# Patient Record
Sex: Female | Born: 1970 | Hispanic: Yes | Marital: Married | Smoking: Never smoker
Health system: Southern US, Community
[De-identification: ages and names within clinical notes are randomized; demographics above are authoritative.]

## PROBLEM LIST (undated history)

## (undated) DIAGNOSIS — I10 Essential (primary) hypertension: Secondary | ICD-10-CM

## (undated) DIAGNOSIS — J45909 Unspecified asthma, uncomplicated: Secondary | ICD-10-CM

## (undated) HISTORY — DX: Essential (primary) hypertension: I10

## (undated) HISTORY — DX: Unspecified asthma, uncomplicated: J45.909

## (undated) HISTORY — PX: ABDOMINAL HYSTERECTOMY: SHX81

---
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
## 1998-08-15 HISTORY — PX: OTHER SURGICAL HISTORY: SHX169

## 2009-08-15 HISTORY — PX: OTHER SURGICAL HISTORY: SHX169

## 2010-08-15 HISTORY — PX: ROTATOR CUFF REPAIR: SHX139

## 2012-05-31 ENCOUNTER — Emergency Department: Payer: Self-pay | Admitting: Emergency Medicine

## 2012-06-10 ENCOUNTER — Emergency Department: Payer: Self-pay | Admitting: Emergency Medicine

## 2012-08-15 DIAGNOSIS — J45909 Unspecified asthma, uncomplicated: Secondary | ICD-10-CM

## 2012-08-15 HISTORY — DX: Unspecified asthma, uncomplicated: J45.909

## 2013-05-06 ENCOUNTER — Emergency Department: Payer: Self-pay | Admitting: Emergency Medicine

## 2013-09-26 ENCOUNTER — Ambulatory Visit: Payer: Self-pay | Admitting: Obstetrics and Gynecology

## 2013-10-20 ENCOUNTER — Emergency Department: Payer: Self-pay | Admitting: Emergency Medicine

## 2013-10-20 LAB — CBC WITH DIFFERENTIAL/PLATELET
BASOS ABS: 0 10*3/uL (ref 0.0–0.1)
Basophil %: 0.4 %
EOS PCT: 1 %
Eosinophil #: 0.1 10*3/uL (ref 0.0–0.7)
HCT: 41.6 % (ref 35.0–47.0)
HGB: 13.7 g/dL (ref 12.0–16.0)
Lymphocyte #: 2.5 10*3/uL (ref 1.0–3.6)
Lymphocyte %: 34 %
MCH: 28.9 pg (ref 26.0–34.0)
MCHC: 32.9 g/dL (ref 32.0–36.0)
MCV: 88 fL (ref 80–100)
MONO ABS: 0.7 x10 3/mm (ref 0.2–0.9)
Monocyte %: 9.4 %
NEUTROS ABS: 4 10*3/uL (ref 1.4–6.5)
NEUTROS PCT: 55.2 %
PLATELETS: 194 10*3/uL (ref 150–440)
RBC: 4.73 10*6/uL (ref 3.80–5.20)
RDW: 13.2 % (ref 11.5–14.5)
WBC: 7.2 10*3/uL (ref 3.6–11.0)

## 2013-10-20 LAB — URINALYSIS, COMPLETE
BILIRUBIN, UR: NEGATIVE
Bacteria: NONE SEEN
Glucose,UR: NEGATIVE mg/dL (ref 0–75)
Ketone: NEGATIVE
LEUKOCYTE ESTERASE: NEGATIVE
Nitrite: NEGATIVE
Ph: 5 (ref 4.5–8.0)
RBC,UR: 11 /HPF (ref 0–5)
Specific Gravity: 1.025 (ref 1.003–1.030)
Squamous Epithelial: 1
WBC UR: 1 /HPF (ref 0–5)

## 2013-10-20 LAB — LIPASE, BLOOD: LIPASE: 164 U/L (ref 73–393)

## 2013-10-20 LAB — COMPREHENSIVE METABOLIC PANEL
ALBUMIN: 3.8 g/dL (ref 3.4–5.0)
ALT: 33 U/L (ref 12–78)
ANION GAP: 5 — AB (ref 7–16)
Alkaline Phosphatase: 69 U/L
BUN: 13 mg/dL (ref 7–18)
Bilirubin,Total: 0.4 mg/dL (ref 0.2–1.0)
Calcium, Total: 8.6 mg/dL (ref 8.5–10.1)
Chloride: 110 mmol/L — ABNORMAL HIGH (ref 98–107)
Co2: 25 mmol/L (ref 21–32)
Creatinine: 0.73 mg/dL (ref 0.60–1.30)
EGFR (Non-African Amer.): >60
GLUCOSE: 89 mg/dL (ref 65–99)
Osmolality: 279 (ref 275–301)
Potassium: 3.7 mmol/L (ref 3.5–5.1)
SGOT(AST): 36 U/L (ref 15–37)
Sodium: 140 mmol/L (ref 136–145)
TOTAL PROTEIN: 8 g/dL (ref 6.4–8.2)

## 2013-10-24 ENCOUNTER — Emergency Department: Payer: Self-pay | Admitting: Emergency Medicine

## 2013-10-24 LAB — CBC WITH DIFFERENTIAL/PLATELET
BASOS ABS: 0 10*3/uL (ref 0.0–0.1)
Basophil %: 0.3 %
EOS ABS: 0 10*3/uL (ref 0.0–0.7)
Eosinophil %: 0.1 %
HCT: 41.3 % (ref 35.0–47.0)
HGB: 14.3 g/dL (ref 12.0–16.0)
Lymphocyte #: 0.8 10*3/uL — ABNORMAL LOW (ref 1.0–3.6)
Lymphocyte %: 9.5 %
MCH: 30.1 pg (ref 26.0–34.0)
MCHC: 34.6 g/dL (ref 32.0–36.0)
MCV: 87 fL (ref 80–100)
MONOS PCT: 10.7 %
Monocyte #: 0.9 x10 3/mm (ref 0.2–0.9)
NEUTROS ABS: 6.6 10*3/uL — AB (ref 1.4–6.5)
Neutrophil %: 79.4 %
Platelet: 195 10*3/uL (ref 150–440)
RBC: 4.74 10*6/uL (ref 3.80–5.20)
RDW: 13.3 % (ref 11.5–14.5)
WBC: 8.3 10*3/uL (ref 3.6–11.0)

## 2013-10-24 LAB — COMPREHENSIVE METABOLIC PANEL
ALBUMIN: 3.8 g/dL (ref 3.4–5.0)
ANION GAP: 5 — AB (ref 7–16)
Alkaline Phosphatase: 74 U/L
BUN: 6 mg/dL — ABNORMAL LOW (ref 7–18)
Bilirubin,Total: 0.4 mg/dL (ref 0.2–1.0)
CHLORIDE: 108 mmol/L — AB (ref 98–107)
Calcium, Total: 8.4 mg/dL — ABNORMAL LOW (ref 8.5–10.1)
Co2: 25 mmol/L (ref 21–32)
Creatinine: 0.8 mg/dL (ref 0.60–1.30)
EGFR (African American): >60
EGFR (Non-African Amer.): >60
Glucose: 95 mg/dL (ref 65–99)
Osmolality: 273 (ref 275–301)
POTASSIUM: 3.6 mmol/L (ref 3.5–5.1)
SGOT(AST): 35 U/L (ref 15–37)
SGPT (ALT): 32 U/L (ref 12–78)
SODIUM: 138 mmol/L (ref 136–145)
TOTAL PROTEIN: 8.3 g/dL — AB (ref 6.4–8.2)

## 2013-10-24 LAB — URINALYSIS, COMPLETE
Bilirubin,UR: NEGATIVE
Glucose,UR: NEGATIVE mg/dL (ref 0–75)
Ketone: NEGATIVE
Leukocyte Esterase: NEGATIVE
Nitrite: NEGATIVE
PH: 5 (ref 4.5–8.0)
Protein: 100
RBC,UR: 464 /HPF (ref 0–5)
Specific Gravity: 1.008 (ref 1.003–1.030)
Squamous Epithelial: 2

## 2013-10-24 LAB — LIPASE, BLOOD: LIPASE: 106 U/L (ref 73–393)

## 2013-10-24 LAB — PREGNANCY, URINE: Pregnancy Test, Urine: NEGATIVE m[IU]/mL

## 2014-08-12 ENCOUNTER — Ambulatory Visit: Payer: Self-pay | Admitting: Obstetrics and Gynecology

## 2014-08-12 DIAGNOSIS — I10 Essential (primary) hypertension: Secondary | ICD-10-CM

## 2014-08-12 LAB — CBC
HCT: 42 % (ref 35.0–47.0)
HGB: 13.9 g/dL (ref 12.0–16.0)
MCH: 29.7 pg (ref 26.0–34.0)
MCHC: 33.1 g/dL (ref 32.0–36.0)
MCV: 90 fL (ref 80–100)
Platelet: 184 10*3/uL (ref 150–440)
RBC: 4.68 10*6/uL (ref 3.80–5.20)
RDW: 13.3 % (ref 11.5–14.5)
WBC: 7.2 10*3/uL (ref 3.6–11.0)

## 2014-08-12 LAB — BASIC METABOLIC PANEL
Anion Gap: 4 — ABNORMAL LOW (ref 7–16)
BUN: 9 mg/dL (ref 7–18)
CHLORIDE: 104 mmol/L (ref 98–107)
Calcium, Total: 9.6 mg/dL (ref 8.5–10.1)
Co2: 31 mmol/L (ref 21–32)
Creatinine: 0.75 mg/dL (ref 0.60–1.30)
EGFR (African American): >60
GLUCOSE: 94 mg/dL (ref 65–99)
OSMOLALITY: 276 (ref 275–301)
Potassium: 4.4 mmol/L (ref 3.5–5.1)
Sodium: 139 mmol/L (ref 136–145)

## 2014-08-12 LAB — HEPATIC FUNCTION PANEL A (ARMC)
ALBUMIN: 3.6 g/dL (ref 3.4–5.0)
Alkaline Phosphatase: 80 U/L
Bilirubin,Total: 0.4 mg/dL (ref 0.2–1.0)
SGOT(AST): 47 U/L — ABNORMAL HIGH (ref 15–37)
SGPT (ALT): 60 U/L
Total Protein: 7.8 g/dL (ref 6.4–8.2)

## 2014-08-18 ENCOUNTER — Ambulatory Visit: Payer: Self-pay | Admitting: Obstetrics and Gynecology

## 2014-11-28 ENCOUNTER — Other Ambulatory Visit: Payer: Self-pay | Admitting: Oncology

## 2014-11-28 DIAGNOSIS — Z1231 Encounter for screening mammogram for malignant neoplasm of breast: Secondary | ICD-10-CM

## 2014-12-08 LAB — SURGICAL PATHOLOGY

## 2014-12-14 NOTE — Op Note (Signed)
PATIENT NAME:  Anita Mendoza, Anita Mendoza MR#:  [id number] DATE OF BIRTH:  29-Apr-1971  DATE OF PROCEDURE:  08/18/2014  PREOPERATIVE DIAGNOSES:  1. Menorrhagia.  2. 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   POSTOPERATIVE DIAGNOSES: 1. Menorrhagia.  2. 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   OPERATIVE PROCEDURE:  1. Hysteroscopy.  2. Dilation and curettage of the endometrium.   SURGEON: Prentice DockerMartin A. Raejean Swinford, MD.  FIRST ASSISTANT: None.   ANESTHESIA: General.   INDICATIONS: The patient is a 44 year old Latin female, para 0, with history of chronic hypertension, diabetes mellitus, and obesity type 2, who presents for surgical evaluation and management of menorrhagia. Preoperative endometrial biopsy demonstrated complex atypical endometrial hyperplasia in a background of simple hyperplasia with atypia.   FINDINGS AT SURGERY: Uterus was difficult to palpate on bimanual examination. The uterus did sound to 10 cm. There was a shaggy endometrium noted on hysteroscopy without evidence of overt abnormal intrauterine lesions.   DESCRIPTION OF PROCEDURE: The patient was brought to the Operating Room where she was placed in the spines position. General anesthesia was induced without difficulty. She was placed in the dorsal lithotomy position using the bumblebee stirrups. A Betadine perineal, intravaginal prep and drape was performed in standard fashion. A silicone catheter was used to drain 200 mL of urine from the bladder. Bimanual exam demonstrated the uterus to be difficult to palpate. A Sims retractor was placed into the vagina, and a single-tooth tenaculum was placed on the anterior lip of the cervix. The uterus did sound to 10 cm. The cavity appeared to be regular. The [facility] hysteroscope using lactated Ringer's as irrigant was used to assess the intrauterine cavity. Shaggy endometrium was noted without evidence of focal abnormal  lesions. Curettage was performed with both smooth and serrated curettes. Tissue was sent to pathology. Repeat hysteroscopy demonstrated minimal residual tissue left behind. Upon completion of the procedure, all instrumentation was removed from the vagina. The patient was then awakened, mobilized, and taken to the recovery room in satisfactory condition.   ESTIMATED BLOOD LOSS: 25 mL.   IV FLUIDS: Not quantified at the time of this dictation.   URINE OUTPUT: 200 mL.   COUNTS: All instruments, needle, and sponge counts were verified as correct. The patient is not a good candidate for vaginal surgery if hysterectomy is desired due to the patient's body habitus and nulliparous state.    ____________________________ Prentice DockerMartin A. Yannis Broce, MD mad:jh D: 08/18/2014 11:01:01 ET T: 08/18/2014 13:24:00 ET JOB#: [id number]  cc: Daphine DeutscherMartin A. Isiac Breighner, MD, <Dictator> [facility] Prentice DockerMARTIN A Chandani Rogowski MD ELECTRONICALLY SIGNED 08/22/2014 14:47

## 2014-12-22 ENCOUNTER — Ambulatory Visit
Admission: RE | Admit: 2014-12-22 | Discharge: 2014-12-22 | Disposition: A | Discharge status: Home / Self Care | Payer: Self-pay | Source: Ambulatory Visit | Attending: Oncology | Admitting: Oncology

## 2014-12-22 ENCOUNTER — Inpatient Hospital Stay: Payer: Self-pay | Attending: Oncology | Admitting: *Deleted

## 2014-12-22 ENCOUNTER — Encounter: Payer: Self-pay | Admitting: *Deleted

## 2014-12-22 DIAGNOSIS — Z0000 Encounter for general adult medical examination without abnormal findings: Secondary | ICD-10-CM

## 2014-12-22 DIAGNOSIS — Z1231 Encounter for screening mammogram for malignant neoplasm of breast: Secondary | ICD-10-CM

## 2014-12-22 NOTE — Progress Notes (Signed)
Subjective:     Patient ID: Anita Mendoza, female   DOB: 08-Jun-1971, 44 y.o.   MRN: [id number]  HPI   Review of Systems     Objective:   Physical Exam  Pulmonary/Chest: Right breast exhibits no inverted nipple, no mass, no nipple discharge, no skin change and no tenderness. Left breast exhibits no inverted nipple, no mass, no nipple discharge, no skin change and no tenderness. Breasts are symmetrical.         Assessment:     44 year old English speaking Latino female [facility].  Last mammogram 12/15 was a birads 1.  Patient states she had a hysterectomy in February 2016 for some type of "pre-cancerous" finding.  Clinical breast exam unremarkable.    Plan:     Screening mammogram ordered.  Follow-up per [facility] protocol.

## 2014-12-29 ENCOUNTER — Encounter: Payer: Self-pay | Admitting: *Deleted

## 2014-12-29 NOTE — Progress Notes (Signed)
Letter mailed from the [facility] to inform patient of her normal mammogram results.  Patient is to follow-up with annual screening in one year.  HSIS to Christy. 

## 2015-04-06 ENCOUNTER — Emergency Department: Payer: Self-pay

## 2015-04-06 ENCOUNTER — Emergency Department
Admission: EM | Admit: 2015-04-06 | Discharge: 2015-04-06 | Disposition: A | Discharge status: Home / Self Care | Payer: Self-pay | Attending: Emergency Medicine | Admitting: Emergency Medicine

## 2015-04-06 ENCOUNTER — Encounter: Payer: Self-pay | Admitting: Emergency Medicine

## 2015-04-06 DIAGNOSIS — Y998 Other external cause status: Secondary | ICD-10-CM | POA: Insufficient documentation

## 2015-04-06 DIAGNOSIS — I10 Essential (primary) hypertension: Secondary | ICD-10-CM | POA: Insufficient documentation

## 2015-04-06 DIAGNOSIS — S3992XA Unspecified injury of lower back, initial encounter: Secondary | ICD-10-CM | POA: Insufficient documentation

## 2015-04-06 DIAGNOSIS — M25512 Pain in left shoulder: Secondary | ICD-10-CM

## 2015-04-06 DIAGNOSIS — Z7951 Long term (current) use of inhaled steroids: Secondary | ICD-10-CM | POA: Insufficient documentation

## 2015-04-06 DIAGNOSIS — Z91040 Latex allergy status: Secondary | ICD-10-CM | POA: Insufficient documentation

## 2015-04-06 DIAGNOSIS — W010XXA Fall on same level from slipping, tripping and stumbling without subsequent striking against object, initial encounter: Secondary | ICD-10-CM | POA: Insufficient documentation

## 2015-04-06 DIAGNOSIS — S8992XA Unspecified injury of left lower leg, initial encounter: Secondary | ICD-10-CM | POA: Insufficient documentation

## 2015-04-06 DIAGNOSIS — Z79899 Other long term (current) drug therapy: Secondary | ICD-10-CM | POA: Insufficient documentation

## 2015-04-06 DIAGNOSIS — Y9389 Activity, other specified: Secondary | ICD-10-CM | POA: Insufficient documentation

## 2015-04-06 DIAGNOSIS — Y92512 Supermarket, store or market as the place of occurrence of the external cause: Secondary | ICD-10-CM | POA: Insufficient documentation

## 2015-04-06 DIAGNOSIS — S4992XA Unspecified injury of left shoulder and upper arm, initial encounter: Secondary | ICD-10-CM | POA: Insufficient documentation

## 2015-04-06 MED ORDER — HYDROCODONE-ACETAMINOPHEN 5-325 MG PO TABS
1.0000 | ORAL_TABLET | Freq: Once | ORAL | Status: AC
  Administered 2015-04-06: 1 via ORAL
  Filled 2015-04-06: qty 1

## 2015-04-06 MED ORDER — TRAMADOL HCL 50 MG PO TABS: 50.0000 mg | ORAL_TABLET | Freq: Four times a day (QID) | ORAL | Status: DC | PRN

## 2015-04-06 NOTE — Discharge Instructions (Signed)
Shoulder Pain  The shoulder is the joint that connects your arm to your body. Muscles and band-like tissues that connect bones to muscles (tendons) hold the joint together. Shoulder pain is felt if an injury or medical problem affects one or more parts of the shoulder.  HOME CARE   · Put ice on the sore area.  ¨ Put ice in a plastic bag.  ¨ Place a towel between your skin and the bag.  ¨ Leave the ice on for 15-20 minutes, 03-04 times a day for the first 2 days.  · Stop using cold packs if they do not help with the pain.  · If you were given something to keep your shoulder from moving (sling; shoulder immobilizer), wear it as told. Only take it off to shower or bathe.  · Move your arm as little as possible, but keep your hand moving to prevent puffiness (swelling).  · Squeeze a soft ball or foam pad as much as possible to help prevent swelling.  · Take medicine as told by your doctor.  GET HELP IF:  · You have progressing new pain in your arm, hand, or fingers.  · Your hand or fingers get cold.  · Your medicine does not help lessen your pain.  GET HELP RIGHT AWAY IF:   · Your arm, hand, or fingers are numb or tingling.  · Your arm, hand, or fingers are puffy (swollen), painful, or turn white or blue.  MAKE SURE YOU:   · Understand these instructions.  · Will watch your condition.  · Will get help right away if you are not doing well or get worse.  Document Released: 01/18/2008 Document Revised: 12/16/2013 Document Reviewed: 02/13/2012  ExitCare® Patient Information ©2015 [employer]. This information is not intended to replace advice given to you by your health care provider. Make sure you discuss any questions you have with your health care provider.

## 2015-04-06 NOTE — ED Notes (Signed)
Pt presents to ER with pain to left shoulder,left knee, and lower back. Pt states she was in a store, tripped on uneven ground, states her left shoulder is sore, lower back pain and left knee pain from fall. Pt has full ROM just has pain when moving.

## 2015-04-06 NOTE — ED Notes (Signed)
Pt states she was in a store, tripped on uneven ground, states her left shoulder is sore, lower back pain and left knee pain from fall.

## 2015-04-06 NOTE — ED Notes (Signed)
Pt walked to triage with no issues.

## 2015-04-06 NOTE — ED Provider Notes (Signed)
[facility] Emergency Department Provider Note  Time seen: 8:43 PM  I have reviewed the triage vital signs and the nursing notes.   HISTORY  Chief Complaint Fall    HPI Anita Mendoza is a 44 y.o. female with a past medical history of hypertension and asthma who presents the emergency department after a fall at a store. According to the patient she was at a store when she tripped on a step falling forward landing on her arms and knees. Patient states pain to her left shoulder, left knee, and lower back. Denies hitting her head. Denies loss of consciousness. She states she had rotator cuff surgery 3 years ago, and her pain to the area feels like it did when she tore her rotator cuff. Denies any numbness or weakness. Patient has been ambulatory, she states mild knee pain when she walks but is able to walk without a limp. Describes her pain as moderate in the left shoulder, mild in the lower back and left knee. Dull/aching quality. Worse when moving.     Past Medical History  Diagnosis Date  . Hypertension   . Asthma 2014    There are no active problems to display for this patient.   Past Surgical History  Procedure Laterality Date  . Abdominal hysterectomy    . Choleycystectomy N/A 2000  . Tonsillectomy N/A 1979  . Carpul tunnel Bilateral 2011  . Rotator cuff repair Left 2012    Current Outpatient Rx  Name  Route  Sig  Dispense  Refill  . albuterol (PROVENTIL HFA;VENTOLIN HFA) 108 (90 BASE) MCG/ACT inhaler   Inhalation   Inhale 2 puffs into the lungs every 6 (six) hours as needed for wheezing or shortness of breath. Patient takes 2 times a day         . beclomethasone (QVAR) 40 MCG/ACT inhaler   Inhalation   Inhale 2 puffs into the lungs 2 (two) times daily.         Marland Kitchen esomeprazole (NEXIUM) 40 MG capsule   Oral   Take 40 mg by mouth daily at 12 noon.         . fluticasone (FLONASE) 50 MCG/ACT nasal spray   Each Nare   Place 2 sprays into  both nostrils daily.         Marland Kitchen lisinopril (PRINIVIL,ZESTRIL) 5 MG tablet   Oral   Take 5 mg by mouth daily.         Marland Kitchen loratadine (CLARITIN) 10 MG tablet   Oral   Take 10 mg by mouth daily.         . metFORMIN (GLUCOPHAGE) 500 MG tablet   Oral   Take 500 mg by mouth daily with breakfast.         . ranitidine (ZANTAC) 150 MG tablet   Oral   Take 150 mg by mouth 2 (two) times daily.           Allergies Shellfish allergy and Latex  Family History  Problem Relation Age of Onset  . Cancer Father     prostate  . Breast cancer Maternal Aunt   . Cancer Maternal Aunt   . Breast cancer Cousin   . Cancer Cousin     bladder    Social History Social History  Substance Use Topics  . Smoking status: Never Smoker   . Smokeless tobacco: None  . Alcohol Use: No    Review of Systems Constitutional: Negative for fever. Cardiovascular: Negative for chest pain. Respiratory:  Negative for shortness of breath. Gastrointestinal: Negative for abdominal pain Genitourinary: Negative for dysuria. Musculoskeletal: Positive for lower back pain. Positive for left shoulder pain. Positive for left knee pain. Skin: Negative for rash. No bruising. Neurological: Negative for headache 10-point ROS otherwise negative.  ____________________________________________   PHYSICAL EXAM:  VITAL SIGNS: ED Triage Vitals  Enc Vitals Group     BP 04/06/15 1840 117/73 mmHg     Pulse Rate 04/06/15 1839 98     Resp 04/06/15 1839 18     Temp 04/06/15 1839 98.4 F (36.9 C)     Temp Source 04/06/15 1839 Oral     SpO2 04/06/15 1839 100 %     Weight 04/06/15 1837 250 lb (113.399 kg)     Height 04/06/15 1837 5\' 7"  (1.702 m)     Head Cir --      Peak Flow --      Pain Score 04/06/15 1837 10     Pain Loc --      Pain Edu? --      Excl. in GC? --     Constitutional: Alert and oriented. Well appearing and in no distress. Eyes: Normal exam ENT   Head: Normocephalic and  atraumatic. Cardiovascular: Normal rate, regular rhythm.  Respiratory: Normal respiratory effort without tachypnea nor retractions. Breath sounds are clear and equal bilaterally. No wheezes/rales/rhonchi. Gastrointestinal: Soft and nontender. No distention.  Musculoskeletal: Moderate tenderness to the left shoulder, good range of motion but with pain. Neurovascularly intact distally with 2+ radial pulse. Mild left knee tenderness palpation, normal range of motion. No effusion palpated. Mild lower back pain palpated, no deformity, appears to be more paraspinal. Neurologic:  Normal speech and language. No gross focal neurologic deficits Skin:  Skin is warm, dry and intact.  Psychiatric: Mood and affect are normal. Speech and behavior are normal.  ____________________________________________   RADIOLOGY  Left shoulder X-ray negative  ____________________________________________    INITIAL IMPRESSION / ASSESSMENT AND PLAN / ED COURSE  Pertinent labs & imaging results that were available during my care of the patient were reviewed by me and considered in my medical decision making (see chart for details).  Patient with lower back pain, left shoulder pain, left knee pain following a fall at a store. The fall appears to be mechanical. We will x-ray her left shoulder, and treat her pain. The patient will follow up with orthopedics for further evaluation.  Left shoulder x-ray negative, patient feeling better after Norco. We'll discharge the patient home with Ultram and orthopedic follow-up. ____________________________________________   FINAL CLINICAL IMPRESSION(S) / ED DIAGNOSES  Contusions Musculoskeletal pain Thresa Ross, MD 04/06/15 2151

## 2015-12-23 ENCOUNTER — Encounter: Payer: Self-pay | Admitting: *Deleted

## 2015-12-23 ENCOUNTER — Ambulatory Visit
Admission: RE | Admit: 2015-12-23 | Discharge: 2015-12-23 | Disposition: A | Discharge status: Home / Self Care | Payer: Self-pay | Source: Ambulatory Visit | Attending: Oncology | Admitting: Oncology

## 2015-12-23 ENCOUNTER — Ambulatory Visit: Payer: Self-pay | Attending: Oncology | Admitting: *Deleted

## 2015-12-23 VITALS — BP 145/92 | HR 68 | Temp 98.2°F | Ht 66.54 in | Wt 267.4 lb

## 2015-12-23 DIAGNOSIS — Z0000 Encounter for general adult medical examination without abnormal findings: Secondary | ICD-10-CM

## 2015-12-23 NOTE — Patient Instructions (Signed)
Hypertension Hypertension, commonly called high blood pressure, is when the force of blood pumping through your arteries is too strong. Your arteries are the blood vessels that carry blood from your heart throughout your body. A blood pressure reading consists of a higher number over a lower number, such as 110/72. The higher number (systolic) is the pressure inside your arteries when your heart pumps. The lower number (diastolic) is the pressure inside your arteries when your heart relaxes. Ideally you want your blood pressure below 120/80. Hypertension forces your heart to work harder to pump blood. Your arteries may become narrow or stiff. Having untreated or uncontrolled hypertension can cause heart attack, stroke, kidney disease, and other problems. RISK FACTORS Some risk factors for high blood pressure are controllable. Others are not.  Risk factors you cannot control include:   Race. You may be at higher risk if you are African American.  Age. Risk increases with age.  Gender. Men are at higher risk than women before age 44 years. After age 44, women are at higher risk than men. Risk factors you can control include:  Not getting enough exercise or physical activity.  Being overweight.  Getting too much fat, sugar, calories, or salt in your diet.  Drinking too much alcohol.  Gave patient hand-out, [facility] St[facility], [facility] and [facility], with education on breast health, pap smears, heart and colon health. SIGNS AND SYMPTOMS Hypertension does not usually cause signs or symptoms. Extremely high blood pressure (hypertensive crisis) may cause headache, anxiety, shortness of breath, and nosebleed. DIAGNOSIS To check if you have hypertension, your health care provider will measure your blood pressure while you are seated, with your arm held at the level of your heart. It should be measured at least twice using the same arm. Certain conditions can cause a difference in blood  pressure between your right and left arms. A blood pressure reading that is higher than normal on one occasion does not mean that you need treatment. If it is not clear whether you have high blood pressure, you may be asked to return on a different day to have your blood pressure checked again. Or, you may be asked to monitor your blood pressure at home for 1 or more weeks. TREATMENT Treating high blood pressure includes making lifestyle changes and possibly taking medicine. Living a healthy lifestyle can help lower high blood pressure. You may need to change some of your habits. Lifestyle changes may include:  Following the DASH diet. This diet is high in fruits, vegetables, and whole grains. It is low in salt, red meat, and added sugars.  Keep your sodium intake below 2,300 mg per day.  Getting at least 30-45 minutes of aerobic exercise at least 4 times per week.  Losing weight if necessary.  Not smoking.  Limiting alcoholic beverages.  Learning ways to reduce stress. Your health care provider may prescribe medicine if lifestyle changes are not enough to get your blood pressure under control, and if one of the following is true:  You are 6818-44 years of age and your systolic blood pressure is above 140.  You are 44 years of age or older, and your systolic blood pressure is above 150.  Your diastolic blood pressure is above 90.  You have diabetes, and your systolic blood pressure is over 140 or your diastolic blood pressure is over 90.  You have kidney disease and your blood pressure is above 140/90.  You have heart disease and your blood pressure  is above 140/90. Your personal target blood pressure may vary depending on your medical conditions, your age, and other factors. HOME CARE INSTRUCTIONS  Have your blood pressure rechecked as directed by your health care provider.   Take medicines only as directed by your health care provider. Follow the directions carefully. Blood  pressure medicines must be taken as prescribed. The medicine does not work as well when you skip doses. Skipping doses also puts you at risk for problems.  Do not smoke.   Monitor your blood pressure at home as directed by your health care provider. SEEK MEDICAL CARE IF:   You think you are having a reaction to medicines taken.  You have recurrent headaches or feel dizzy.  You have swelling in your ankles.  You have trouble with your vision. SEEK IMMEDIATE MEDICAL CARE IF:  You develop a severe headache or confusion.  You have unusual weakness, numbness, or feel faint.  You have severe chest or abdominal pain.  You vomit repeatedly.  You have trouble breathing. MAKE SURE YOU:   Understand these instructions.  Will watch your condition.  Will get help right away if you are not doing well or get worse.   This information is not intended to replace advice given to you by your health care provider. Make sure you discuss any questions you have with your health care provider.   Document Released: 08/01/2005 Document Revised: 12/16/2014 Document Reviewed: 05/24/2013 Elsevier Interactive Patient Education 2016 Elsevier Inc.  

## 2015-12-23 NOTE — Progress Notes (Signed)
Subjective:     Patient ID: Eston MouldMercedes Boehning, female   DOB: March 03, 1971, 45 y.o.   MRN: [id number]  HPI   Review of Systems     Objective:   Physical Exam  Pulmonary/Chest: Right breast exhibits no inverted nipple, no mass, no nipple discharge, no skin change and no tenderness. Left breast exhibits no inverted nipple, no mass, no nipple discharge, no skin change and no tenderness. Breasts are symmetrical.         Assessment:     45 year old English speaking Hispanic female [facility]  Patients husband is present during the interview and exam.  Clinical breast exam unremarkable.  Taught self breast awareness.  Blood pressure elevated at 145/92.  She is to take her blood pressure meds as soon as possible and recheck her blood pressure at [facility] or [facility], and if remains higher than 140/90 she is to follow-up with her primary care provider.  Hand out on hypertention given to patient.[facility] with [facility] endometrial [facility].  Patient has been screened for eligibility.  She does not have any insurance, Medicare or Medicaid.  She also meets financial eligibility.  Hand-out given on the Affordable Care Act.     Plan:     Screening mammogram ordered.  Will follow-up per protocol.

## 2015-12-24 ENCOUNTER — Encounter: Payer: Self-pay | Admitting: *Deleted

## 2015-12-24 NOTE — Progress Notes (Signed)
Letter mailed from the [facility] to inform patient of her normal mammogram results.  Patient is to follow-up with annual screening in one year.  HSIS to Christy. 

## 2017-02-01 IMAGING — CR DG SHOULDER 2+V*L*
3 series · 3 of 3 positions shown · non-contrast
Comparison: None.

CLINICAL DATA: Initial evaluation for acute trauma, fall. Now with
acute shoulder pain.

EXAM:
LEFT SHOULDER - 2+ VIEW

[shoulder grashey]
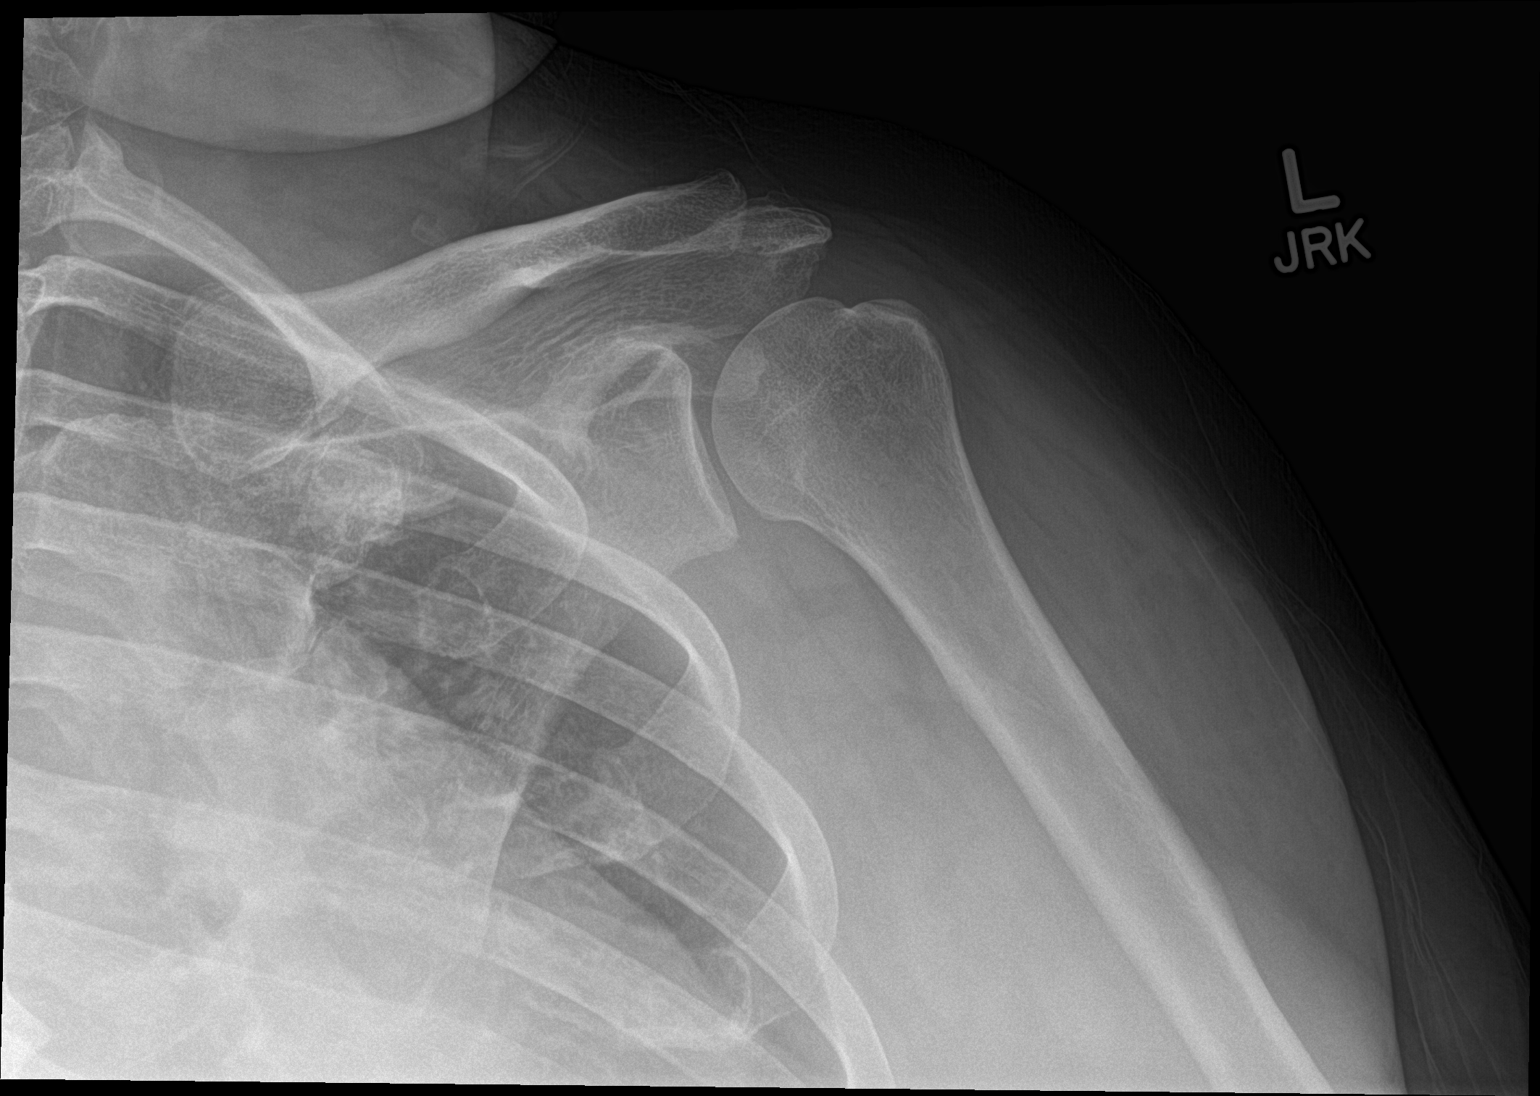

[shoulder y view]
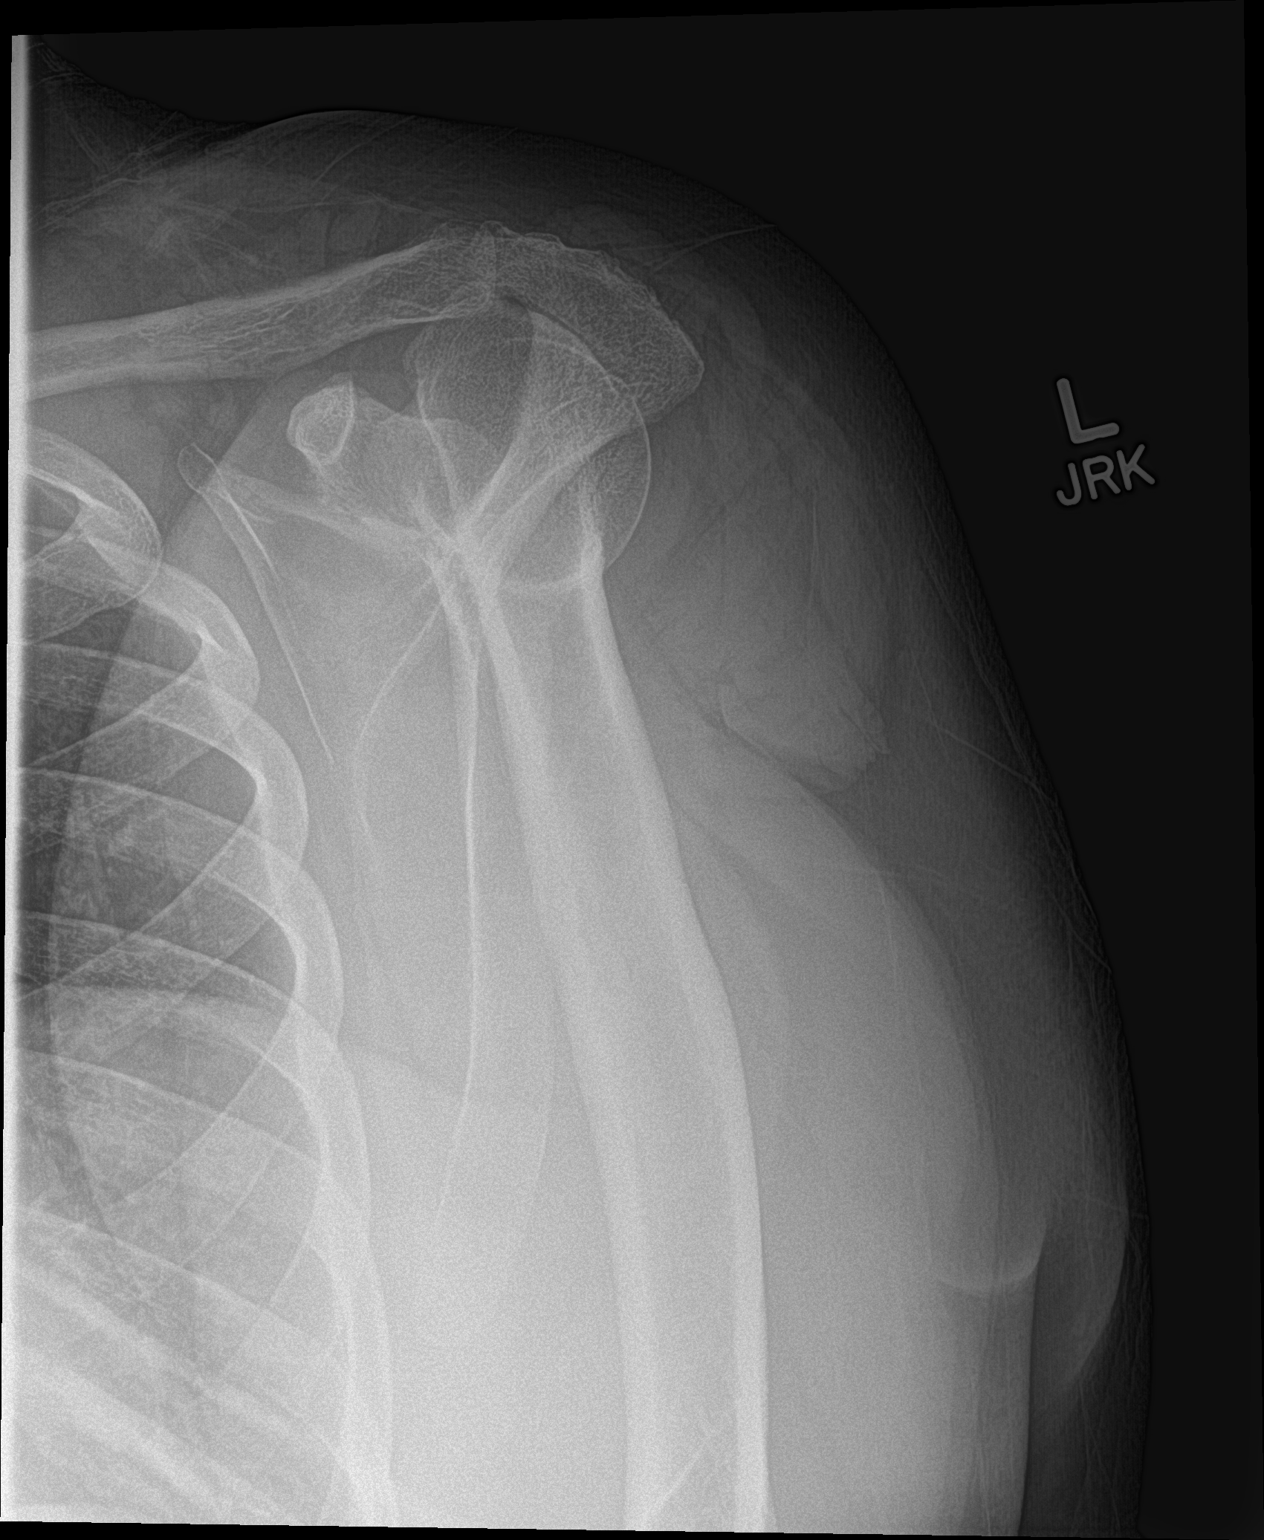

[shoulder axillary]
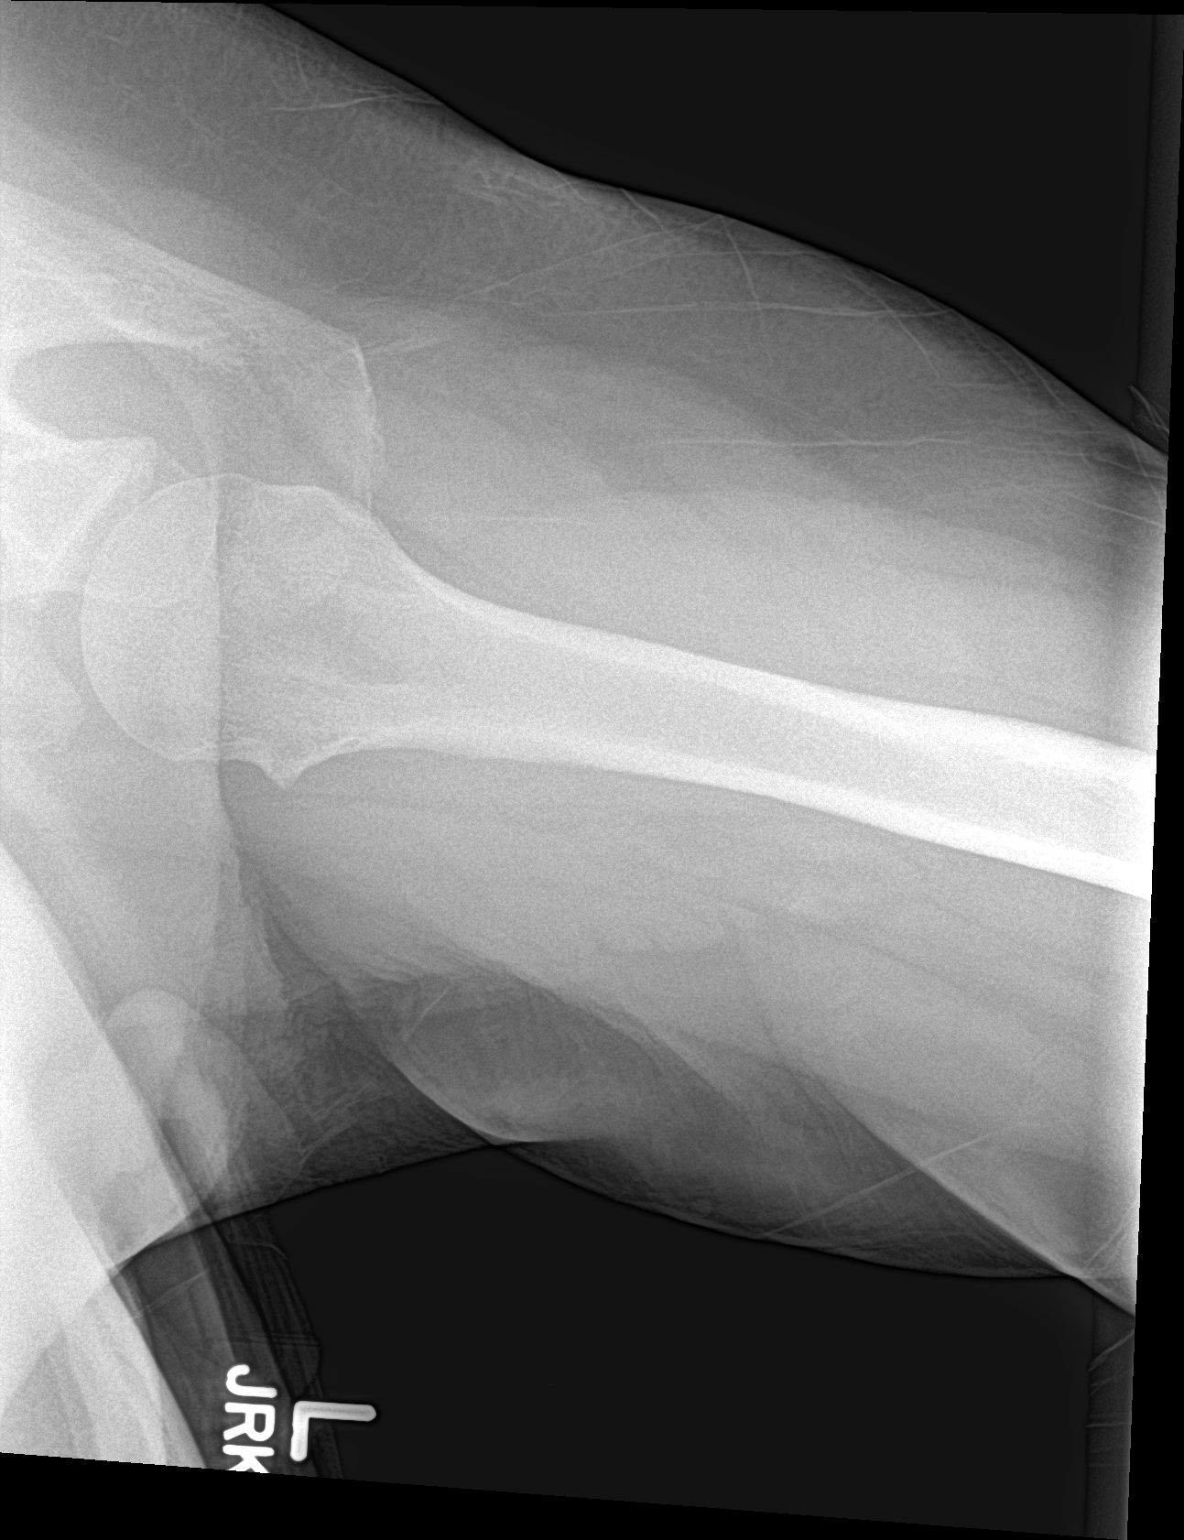

[3 of 3 positions shown; findings below may reference images not displayed]

FINDINGS: No acute fracture or dislocation. Humeral head in normal alignment
with the glenoid. AC joint approximated. No acute soft tissue
abnormality. Degenerative changes present about the AC joint. No
periarticular calcification.
IMPRESSION: No acute fracture or dislocation.

## 2022-10-24 ENCOUNTER — Ambulatory Visit: Payer: BLUE CROSS/BLUE SHIELD | Attending: Orthopedic Surgery | Admitting: Physical Therapy

## 2022-10-24 ENCOUNTER — Encounter: Payer: Self-pay | Admitting: Physical Therapy

## 2022-10-24 DIAGNOSIS — M25611 Stiffness of right shoulder, not elsewhere classified: Secondary | ICD-10-CM | POA: Diagnosis present

## 2022-10-24 DIAGNOSIS — M6281 Muscle weakness (generalized): Secondary | ICD-10-CM | POA: Diagnosis present

## 2022-10-24 DIAGNOSIS — M25511 Pain in right shoulder: Secondary | ICD-10-CM | POA: Diagnosis not present

## 2022-10-24 NOTE — Therapy (Signed)
OUTPATIENT PHYSICAL THERAPY SHOULDER POST-OP EVALUATION  Patient Name: Anita Mendoza MRN: [id number] DOB:[birth year], 52 y.o., female Today's Date: 10/24/2022  END OF SESSION:  PT End of Session - 10/24/22 1505     Visit Number 1    Number of Visits 25    Date for PT Re-Evaluation 01/16/23    Authorization Type [employer] 2024    Progress Note Due on Visit 10    PT Start Time 1507    PT Stop Time 1545    PT Time Calculation (min) 38 min    Activity Tolerance Patient tolerated treatment well    Behavior During Therapy WFL for tasks assessed/performed             Past Medical History:  Diagnosis Date   Asthma 2014   Hypertension    Past Surgical History:  Procedure Laterality Date   ABDOMINAL HYSTERECTOMY     carpul tunnel Bilateral 2011   choleycystectomy N/A 2000   ROTATOR CUFF REPAIR Left 2012   TONSILLECTOMY N/A 1979   There are no problems to display for this patient.   PCP: No PCP on file  REFERRING PROVIDER: Blenda Mounts, MD  REFERRING DIAG:  M25.511 (ICD-10-CM) - Acute pain of right shoulder      RATIONALE FOR EVALUATION AND TREATMENT: Rehabilitation  THERAPY DIAG: Acute pain of right shoulder  Stiffness of right shoulder, not elsewhere classified  Muscle weakness (generalized)  ONSET DATE: DOS: 10/13/22  FOLLOW-UP APPT SCHEDULED WITH REFERRING PROVIDER: Yes, 6 week post-op f/u    SUBJECTIVE:                                                                                                                                                                                         SUBJECTIVE STATEMENT:  Pt is a 52 year old female s/p Right shoulder diagnostic arthroscopy, debridement, rotator cuff repair upper border subscapularis, and open biceps tenodesis.  PERTINENT HISTORY: Pt is a 52 year old female s/p Right shoulder diagnostic arthroscopy, debridement, rotator cuff repair upper border subscapularis, and open biceps tenodesis.   Patient reports notable post-operative pain - she reports having pain at night at this time. She reports using Gabapentin for pain control; she is avoiding use of oxycodone. Pt has cold compression machine to use at home. Patient reports notable pain/swelling along R upper trapezius region. Pt reports noticing bruising along top of arm or along distal anterior arm after getting rid of her abduction pillow. Pt reports histroy of numbness from head of radius down to her hand - this has not been apparent following her surgery.   PAIN:  Pain Intensity: Present: 5/10, Best: 0/10, Worst: 10/10  Pain location: R UT, R paracervical region, R anterior shoulder and along ACJ region Pain Quality: aching  Radiating: No  Numbness/Tingling: No Aggravating factors: discomfort in her sling,  Relieving factors: sitting and at rest , ice  24-hour pain behavior: worse in PM  History of prior shoulder or neck/shoulder injury, pain, surgery, or therapy: Yes, hx of L shoulder arthroscopy 2011 Falls: Has patient fallen in last 6 months? No, Number of falls: N/A Dominant hand: right Imaging: No , no imaging since her surgery  Red flags (personal history of cancer, chills/fever, night sweats, nausea, vomiting, unrelenting pain): Negative  PRECAUTIONS: None  WEIGHT BEARING RESTRICTIONS: Yes NWB RUE  FALLS: Has patient fallen in last 6 months? 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  Recent memory is intact.  Remote memory is intact.  Attention span and  concentration are intact.  Expressive speech is intact.  Patient's fund of knowledge is within normal limits for educational level.    Gross Musculoskeletal Assessment Tremor: None Bulk: Normal Tone: Normal   Posture Self-selected rounded shoulder posture, inc thoracic kyphosis; anteriorly tilted scapulae   PROM PROM (Normal range in degrees) PROM 10/24/2022   Right Left  Shoulder    Flexion 120 deg   Extension    Abduction    External Rotation 30 deg   Internal Rotation 45 deg   Hands Behind Head    Hands Behind Back        Elbow    Flexion WNL   Extension WNL   Pronation WNL   Supination WNL   (* = pain; Blank rows = not tested)   MMT DEFERRED DUE TO POST-OP STATUS LE MMT: MMT (out of 5) Right 10/24/2022 Left 10/24/2022  Shoulder   Flexion    Extension    Abduction    External rotation    Internal rotation    Horizontal abduction    Horizontal adduction    Lower Trapezius    Rhomboids        Elbow  Flexion    Extension    Pronation    Supination        (* = pain; Blank rows = not tested)  Sensation Grossly intact to light touch bilateral UE as determined by testing dermatomes C2-T2. Proprioception and hot/cold testing deferred on this date.  Reflexes Deferred  Palpation Location LEFT  RIGHT           Subocciptials    Cervical paraspinals    Upper Trapezius  1  Levator Scapulae  1  Rhomboid Major/Minor    Sternoclavicular joint  0  Acromioclavicular joint  1  Coracoid process  2  Long head of biceps  2  Supraspinatus  2  Infraspinatus    Subscapularis    Teres Minor    Teres Major    Pectoralis Major    Pectoralis Minor    Anterior Deltoid  1  Lateral Deltoid  1  Posterior Deltoid  1  Latissimus Dorsi    Sternocleidomastoid    (Blank  rows = not tested) Graded on 0-4 scale (0 = no pain, 1 = pain, 2 = pain with wincing/grimacing/flinching, 3 = pain with withdrawal, 4 = unwilling to allow palpation), (Blank rows = not  tested)    SPECIAL TESTS Deferred    TODAY'S TREATMENT    Therapeutic Exercise - for HEP establishment, discussion on appropriate exercise/activity modification, PT education   Reviewed baseline home exercises and provided handout for [place] program (see Access Code); tactile cueing and therapist demonstration utilized as needed for carryover of proper technique to HEP.   Patient education on current condition, anatomy involved, prognosis, plan of care. Discussion on activity modification to protect R shoulder following surgery, including refraining from active functional reaching, holding weight > cup of coffee in R hand.     PATIENT EDUCATION:  Education details: see above for patient education details Person educated: Patient and Spouse Education method: Explanation, Demonstration, and Handouts Education comprehension: verbalized understanding and returned demonstration   HOME EXERCISE PROGRAM:    ASSESSMENT:  CLINICAL IMPRESSION: Patient is a 52 y.o. female who was seen today for physical therapy evaluation and treatment s/p R shoulder arthroscopy with biceps tenodesis, excision of cyst along subscapularis.   OBJECTIVE IMPAIRMENTS: {opptimpairments:25111}.   ACTIVITY LIMITATIONS: {activitylimitations:27494}  PARTICIPATION LIMITATIONS: {participationrestrictions:25113}  PERSONAL FACTORS: {Personal factors:25162} are also affecting patient's functional outcome.   REHAB POTENTIAL: {rehabpotential:25112}  CLINICAL DECISION MAKING: {clinical decision making:25114}  EVALUATION COMPLEXITY: {Evaluation complexity:25115}   GOALS: Goals reviewed with patient? Yes  SHORT TERM GOALS: Target date: {follow up:25551}  Pt will be independent with HEP to improve strength and decrease neck pain to improve pain-free function at home and work. Baseline: *** Goal status: INITIAL   LONG TERM GOALS: Target date: {follow up:25551}  Pt will increase FOTO to at least ***  to demonstrate significant improvement in function at home and work related to neck pain  Baseline:  Goal status: INITIAL  2.  Pt will decrease worst shoulder pain by at least 3 points on the NPRS in order to demonstrate clinically significant reduction in shoulder pain. Baseline: *** Goal status: INITIAL  3.  Pt will decrease quick DASH score by at least 8% in order to demonstrate clinically significant reduction in disability related to shoulder pain        Baseline: *** Goal status: INITIAL  4. Pt will increase strength by at least 1/2 MMT grade in order to demonstrate improvement in strength and function         Baseline: *** Goal status: INITIAL   PLAN: PT FREQUENCY: 1-2x/week  PT DURATION: {rehab duration:25117}  PLANNED INTERVENTIONS: Therapeutic exercises, Therapeutic activity, Neuromuscular re-education, Balance training, Gait training, Patient/Family education, Self Care, Joint mobilization, Joint manipulation, Vestibular training, Canalith repositioning, Orthotic/Fit training, DME instructions, Dry Needling, Electrical stimulation, Spinal manipulation, Spinal mobilization, Cryotherapy, Moist heat, Taping, Traction, Ultrasound, Ionotophoresis '4mg'$ /ml Dexamethasone, Manual therapy, and Re-evaluation.  PLAN FOR NEXT SESSION: ***   Phillips Grout PT, DPT, GCS  Eilleen Kempf, PT 10/24/2022, 4:52 PM

## 2022-10-26 ENCOUNTER — Ambulatory Visit: Payer: BLUE CROSS/BLUE SHIELD | Admitting: Physical Therapy

## 2022-10-26 DIAGNOSIS — M6281 Muscle weakness (generalized): Secondary | ICD-10-CM

## 2022-10-26 DIAGNOSIS — M25611 Stiffness of right shoulder, not elsewhere classified: Secondary | ICD-10-CM

## 2022-10-26 DIAGNOSIS — M25511 Pain in right shoulder: Secondary | ICD-10-CM | POA: Diagnosis not present

## 2022-10-26 NOTE — Therapy (Signed)
OUTPATIENT PHYSICAL THERAPY TREATMENT NOTE   Patient Name: Anita Mendoza MRN: [id number] DOB:[birth year], 52 y.o., female Today's Date: 10/26/2022  PCP:  No PCP on file  REFERRING PROVIDER: Blenda Mounts, MD   END OF SESSION:   PT End of Session - 10/26/22 1414     Visit Number 2    Number of Visits 25    Date for PT Re-Evaluation 01/16/23    Authorization Type [employer] 2024    Progress Note Due on Visit 10    PT Start Time [id number]    PT Stop Time 1500    PT Time Calculation (min) 45 min    Activity Tolerance Patient tolerated treatment well    Behavior During Therapy [facility] for tasks assessed/performed             Past Medical History:  Diagnosis Date   Asthma 2014   Hypertension    Past Surgical History:  Procedure Laterality Date   ABDOMINAL HYSTERECTOMY     carpul tunnel Bilateral 2011   choleycystectomy N/A 2000   ROTATOR CUFF REPAIR Left 2012   TONSILLECTOMY N/A 1979   There are no problems to display for this patient.   REFERRING DIAG: M25.511 (ICD-10-CM) - Acute pain of right shoulder   THERAPY DIAG:  Acute pain of right shoulder  Stiffness of right shoulder, not elsewhere classified  Muscle weakness (generalized)  Rationale for Evaluation and Treatment Rehabilitation  PERTINENT HISTORY: Pt is a 52 year old female s/p Right shoulder diagnostic arthroscopy, debridement, rotator cuff repair upper border subscapularis, and open biceps tenodesis.  Patient reports notable post-operative pain - she reports having pain at night at this time. She reports using Gabapentin for pain control; she is avoiding use of oxycodone. Pt has cold compression machine to use at home. Patient reports notable pain/swelling along R upper trapezius region. Pt reports noticing bruising along top of arm or along distal anterior arm after getting rid of her abduction pillow. Pt reports history of numbness from head of radius down to her hand - this has not been  apparent following her surgery.    PAIN:  Pain Intensity: Present: 5/10, Best: 0/10, Worst: 10/10 Pain location: R UT, R paracervical region, R anterior shoulder and along ACJ region Pain Quality: aching  Radiating: No  Numbness/Tingling: No Aggravating factors: discomfort in her sling Relieving factors: sitting and at rest , ice  24-hour pain behavior: worse in PM  History of prior shoulder or neck/shoulder injury, pain, surgery, or therapy: Yes, hx of L shoulder arthroscopy 2011 Falls: Has patient fallen in last 6 months? No, Number of falls: N/A Dominant hand: right Imaging: No , no imaging since her surgery  Red flags (personal history of cancer, chills/fever, night sweats, nausea, vomiting, unrelenting pain): Negative   PRECAUTIONS: None   WEIGHT BEARING RESTRICTIONS: Yes NWB RUE   FALLS: Has patient fallen in last 6 months? 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 She feels better outside of her sling. Patient reports compliance with HEP. No pain at beginning of treatment, but pain in lobby with sling use. Pt reports minimal discomfort affecting upper trap region today.    PAIN:  Are you having pain?  No   OBJECTIVE: (objective measures completed at initial evaluation unless otherwise dated)     Patient Surveys  FOTO: 76, predicted outcome score of 60   Cognition Patient is oriented to person, place, and time.  Recent memory is intact.  Remote memory is intact.  Attention span and concentration are intact.  Expressive speech is intact.  Patient's fund of knowledge is within normal limits for educational level.                          Gross Musculoskeletal Assessment Tremor: None Bulk: Normal Tone: Normal     Posture Self-selected rounded shoulder posture, inc thoracic kyphosis; anteriorly tilted scapulae     PROM     PROM (Normal range in degrees) PROM 10/24/2022    Right Left  Shoulder      Flexion 120 deg    Extension      Abduction      External Rotation 30 deg    Internal Rotation 45 deg    Hands Behind Head      Hands Behind Back             Elbow      Flexion WNL    Extension WNL    Pronation WNL    Supination WNL    (* = pain; Blank rows = not tested)     MMT DEFERRED DUE TO POST-OP STATUS LE MMT: MMT (out of 5) Right 10/24/2022 Left 10/24/2022  Shoulder   Flexion      Extension      Abduction      External rotation      Internal rotation      Horizontal abduction      Horizontal adduction      Lower Trapezius      Rhomboids             Elbow  Flexion      Extension      Pronation      Supination             (* = pain; Blank rows = not tested)   Sensation Grossly intact to light touch bilateral UE as determined by testing dermatomes C2-T2. Proprioception and hot/cold testing deferred on this date.   Reflexes Deferred   Palpation Location LEFT  RIGHT           Subocciptials      Cervical paraspinals      Upper Trapezius   1  Levator Scapulae   1  Rhomboid Major/Minor      Sternoclavicular joint   0  Acromioclavicular joint   1  Coracoid process   2  Long head of biceps   2  Supraspinatus   2  Infraspinatus       Subscapularis      Teres Minor      Teres Major      Pectoralis Major      Pectoralis Minor      Anterior Deltoid   1  Lateral Deltoid   1  Posterior Deltoid   1  Latissimus Dorsi  Sternocleidomastoid      (Blank rows = not tested) Graded on 0-4 scale (0 = no pain, 1 = pain, 2 = pain with wincing/grimacing/flinching, 3 = pain with withdrawal, 4 = unwilling to allow palpation), (Blank rows = not tested)       SPECIAL TESTS Deferred       TODAY'S TREATMENT     MHP (unbilled) utilized pre-treatment per protocol for analgesic effect and improved soft tissue extensibility/pliability; x 5 minutes .     Therapeutic Exercise - for R shoulder ROM, periscapular isometrics    Pendulums; x20 ea, fwd-bwd and circles clockwise/counterclockwise Table slide, seated; x10 with pillow case under hand to allow for less resistance Seated scapular retractions; 2x10, 3 sec hold  Elbow flexion AAROM, opposite UE assist for ROM; 1x10  PATIENT EDUCATION: HEP update and review (see HEP below)   Manual Therapy - for shoulder ROM, prevention of shoulder stiffness post-operatively   R shoulder PROM into flexion and ER within pt tolerance; x 15 minutes Gentle STM anterior deltoid and biceps x 2 minutes      PATIENT EDUCATION:  Education details: see above for patient education details Person educated: Patient and Spouse Education method: Explanation, Demonstration, and Handouts Education comprehension: verbalized understanding and returned demonstration     HOME EXERCISE PROGRAM:  Access Code: [email or web address] URL: [email or web address] Date: 11/03/2022 Prepared by: Valentina Gu  Exercises - Flexion-Extension Shoulder Pendulum with Table Support  - 2 x daily - 7 x weekly - 2 sets - 10 reps - Seated Scapular Retraction  - 2 x daily - 7 x weekly - 2 sets - 10 reps - Seated Upper Trapezius Stretch  - 2 x daily - 7 x weekly - 3 sets - 30sec hold - Seated Elbow Flexion AAROM   - 1 x daily - 7 x weekly - 2 sets - 10 reps - Seated Shoulder Flexion Towel Slide at Table Top  - 1 x daily - 7 x weekly - 2 sets - 10 reps - 3sec hold    ASSESSMENT:   CLINICAL IMPRESSION: Patient is able to tolerate passive shoulder flexion up to 120 deg without significant pain - she has more pain with return to neutral from position of shoulder elevation. We are only moving shoulder into 30 deg ER at this time. Pt tolerates pendulums and PROM exercises well today. Pt has remaining post-operative impairments including: R shoulder pain, edema, localized inflammation, decreased ROM, postural changes, decreased R upper limb strength. Associated activity limitations in reaching, holding/carrying, lifting, self-care ADLs and household chores. Pt will benefit from continued skilled PT services to address deficits and improve function/QoL   OBJECTIVE IMPAIRMENTS: decreased ROM, decreased strength, hypomobility, increased edema, impaired UE functional use, postural dysfunction, and pain.    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    REHAB POTENTIAL: Excellent   CLINICAL DECISION MAKING: Evolving/moderate complexity   EVALUATION COMPLEXITY: Moderate     GOALS: Goals reviewed with patient? Yes   SHORT TERM GOALS: Target date: 11/09/2022   Pt will be independent with HEP to improve mobility and decrease pain to improve pain-free function at home Baseline: 10/24/22: Baseline HEP reviewed and demonstrated.  Goal status: INITIAL     LONG TERM GOALS: Target date: 01/18/2023   Pt will increase FOTO  to at least 60 to demonstrate significant improvement in function at home and work related to neck pain   Baseline: 10/24/22: 42 Goal status: INITIAL   2.  Pt will decrease worst shoulder pain to no more than 2-3/10 on the NPRS in order to demonstrate clinically significant reduction in shoulder pain. Baseline: 10/24/22: 10/10 pain at worst  Goal status: INITIAL   3.  Pt will have R shoulder AROM within at least 10 degrees of contralateral upper limb without reproduction of shoulder pain as needed for reaching, self-care ADLs, and household chore performance Baseline: 10/24/22: Decreased PROM, no AROM testing due to current post-op timeline.  Goal status: INITIAL   4. Pt will increase strength for R shoulder to at least 4+/5 or greater grade in order to demonstrate improvement in strength and function as needed for lifting and carrying task Baseline: 10/24/22: Strength < 2 based on limited ROM (formal MMT deferred due to early post-op status).  Goal status: INITIAL     PLAN: PT FREQUENCY: 1-2x/week   PT DURATION: 12 weeks   PLANNED INTERVENTIONS: Therapeutic exercises, Therapeutic activity, Neuromuscular re-education,  Patient/Family education, Self Care, Joint mobilization, Joint manipulation, Dry Needling, Electrical stimulation, Cryotherapy, Moist heat, Manual therapy, and Re-evaluation.   PLAN FOR NEXT SESSION: R shoulder and elbow PROM, periscapular isometrics, progress with AAROM as tolerated with precaution for aggressive shoulder hyperextension and ER    Valentina Gu, PT, DPT [id number]  Eilleen Kempf, PT 10/26/2022, 2:14 PM

## 2022-11-03 ENCOUNTER — Ambulatory Visit: Payer: BLUE CROSS/BLUE SHIELD | Admitting: Physical Therapy

## 2022-11-03 DIAGNOSIS — M25511 Pain in right shoulder: Secondary | ICD-10-CM

## 2022-11-03 DIAGNOSIS — M25611 Stiffness of right shoulder, not elsewhere classified: Secondary | ICD-10-CM

## 2022-11-03 DIAGNOSIS — M6281 Muscle weakness (generalized): Secondary | ICD-10-CM

## 2022-11-03 NOTE — Therapy (Signed)
OUTPATIENT PHYSICAL THERAPY TREATMENT NOTE   Patient Name: Anita Mendoza MRN: [id number] DOB:1971-04-06, 52 y.o., female Today's Date: 11/03/2022  PCP:  No PCP on file  REFERRING PROVIDER: Blenda Mounts, MD   END OF SESSION:   PT End of Session - 11/03/22 1302     Visit Number 3    Number of Visits 25    Date for PT Re-Evaluation 01/16/23    Authorization Type [employer] [id number]    Progress Note Due on Visit 10    PT Start Time 1303    PT Stop Time 1344    PT Time Calculation (min) 41 min    Activity Tolerance Patient tolerated treatment well    Behavior During Therapy [facility] for tasks assessed/performed             Past Medical History:  Diagnosis Date   Asthma 2014   Hypertension    Past Surgical History:  Procedure Laterality Date   ABDOMINAL HYSTERECTOMY     carpul tunnel Bilateral 2011   choleycystectomy N/A 2000   ROTATOR CUFF REPAIR Left 2012   TONSILLECTOMY N/A 1979   There are no problems to display for this patient.   REFERRING DIAG: M25.511 (ICD-10-CM) - Acute pain of right shoulder   THERAPY DIAG:  Acute pain of right shoulder  Stiffness of right shoulder, not elsewhere classified  Muscle weakness (generalized)  Rationale for Evaluation and Treatment Rehabilitation  PERTINENT HISTORY: Pt is a 52 year old female s/p Right shoulder diagnostic arthroscopy, debridement, rotator cuff repair upper border subscapularis, and open biceps tenodesis.  Patient reports notable post-operative pain - she reports having pain at night at this time. She reports using Gabapentin for pain control; she is avoiding use of oxycodone. Pt has cold compression machine to use at home. Patient reports notable pain/swelling along R upper trapezius region. Pt reports noticing bruising along top of arm or along distal anterior arm after getting rid of her abduction pillow. Pt reports history of numbness from head of radius down to her hand - this has not been  apparent following her surgery.    PAIN:  Pain Intensity: Present: 5/10, Best: 0/10, Worst: 10/10 Pain location: R UT, R paracervical region, R anterior shoulder and along ACJ region Pain Quality: aching  Radiating: No  Numbness/Tingling: No Aggravating factors: discomfort in her sling Relieving factors: sitting and at rest , ice  24-hour pain behavior: worse in PM  History of prior shoulder or neck/shoulder injury, pain, surgery, or therapy: Yes, hx of L shoulder arthroscopy 2011 Falls: Has patient fallen in last 6 months? No, Number of falls: N/A Dominant hand: right Imaging: No , no imaging since her surgery  Red flags (personal history of cancer, chills/fever, night sweats, nausea, vomiting, unrelenting pain): Negative   PRECAUTIONS: None   WEIGHT BEARING RESTRICTIONS: Yes NWB RUE   FALLS: Has patient fallen in last 6 months? 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 She does report moderate soreness after last PT visit. Pt is compliant with HEP. She is taking off her sling at home as recommended by surgeon.     PAIN:  Are you having pain? No   OBJECTIVE: (objective measures completed at initial evaluation unless  otherwise dated)     Patient Surveys  FOTO: 53, predicted outcome score of 60   Cognition Patient is oriented to person, place, and time.  Recent memory is intact.  Remote memory is intact.  Attention span and concentration are intact.  Expressive speech is intact.  Patient's fund of knowledge is within normal limits for educational level.                          Gross Musculoskeletal Assessment Tremor: None Bulk: Normal Tone: Normal     Posture Self-selected rounded shoulder posture, inc thoracic kyphosis; anteriorly tilted scapulae     PROM     PROM (Normal range in degrees) PROM 10/24/2022    Right Left  Shoulder      Flexion 120 deg    Extension      Abduction      External Rotation 30 deg    Internal Rotation 45 deg    Hands Behind Head      Hands Behind Back             Elbow      Flexion WNL    Extension WNL    Pronation WNL    Supination WNL    (* = pain; Blank rows = not tested)     MMT DEFERRED DUE TO POST-OP STATUS LE MMT: MMT (out of 5) Right 10/24/2022 Left 10/24/2022  Shoulder   Flexion      Extension      Abduction      External rotation      Internal rotation      Horizontal abduction      Horizontal adduction      Lower Trapezius      Rhomboids             Elbow  Flexion      Extension      Pronation      Supination             (* = pain; Blank rows = not tested)   Sensation Grossly intact to light touch bilateral UE as determined by testing dermatomes C2-T2. Proprioception and hot/cold testing deferred on this date.   Reflexes Deferred   Palpation Location LEFT  RIGHT           Subocciptials      Cervical paraspinals      Upper Trapezius   1  Levator Scapulae   1  Rhomboid Major/Minor      Sternoclavicular joint   0  Acromioclavicular joint   1  Coracoid process   2  Long head of biceps   2  Supraspinatus   2  Infraspinatus      Subscapularis      Teres Minor      Teres Major      Pectoralis Major       Pectoralis Minor      Anterior Deltoid   1  Lateral Deltoid   1  Posterior Deltoid   1  Latissimus Dorsi      Sternocleidomastoid      (Blank rows =  not tested) Graded on 0-4 scale (0 = no pain, 1 = pain, 2 = pain with wincing/grimacing/flinching, 3 = pain with withdrawal, 4 = unwilling to allow palpation), (Blank rows = not tested)       SPECIAL TESTS Deferred       TODAY'S TREATMENT     MHP (unbilled) utilized pre-treatment per protocol for analgesic effect and improved soft tissue extensibility/pliability; x 5 minutes .     Therapeutic Exercise - for R shoulder ROM, periscapular isometrics    Pendulums; x20 ea, fwd-bwd and circles clockwise/counterclockwise Upper trapezius stretch, R; 2x30 sec  Seated scapular retractions; 2x10, 3 sec hold  Pulleys, seated; forward flexion AAROM; x20    *not today* Table slide, seated; x10 with pillow case under hand to allow for less resistance Elbow flexion AAROM, opposite UE assist for ROM; 1x10   PATIENT EDUCATION: HEP  review (see HEP below)    Manual Therapy - for shoulder ROM, prevention of shoulder stiffness post-operatively   R shoulder PROM into flexion and ER within pt tolerance; x 15 minutes Gentle STM R upper trapezius anterior deltoid and biceps x 8 minutes      PATIENT EDUCATION:  Education details: see above for patient education details Person educated: Patient and Spouse Education method: Explanation, Demonstration, and Handouts Education comprehension: verbalized understanding and returned demonstration     HOME EXERCISE PROGRAM:  Access Code: [email or web address] URL: [email or web address] Date: 11/03/2022 Prepared by: Valentina Gu  Exercises - Flexion-Extension Shoulder Pendulum with Table Support  - 2 x daily - 7 x weekly - 2 sets - 10 reps - Seated Scapular Retraction  - 2 x daily - 7 x weekly - 2 sets - 10 reps - Seated Upper Trapezius Stretch  - 2 x daily - 7 x weekly - 3 sets - 30sec  hold - Seated Elbow Flexion AAROM  - 1 x daily - 7 x weekly - 2 sets - 10 reps - Seated Shoulder Flexion Towel Slide at Table Top  - 1 x daily - 7 x weekly - 2 sets - 10 reps - 3sec hold    ASSESSMENT:   CLINICAL IMPRESSION: Patient is progressing as expected with ROM currently. Pt has no significant ROM restrictions with elbow or shoulder at this time; we are progressing ROM conservatively to protect biceps at this time. No resistance or load along biceps at this time; this was emphasized with pt education today as well. Pt has remaining post-operative impairments including: R shoulder pain, edema, localized inflammation, decreased ROM, postural changes, decreased R upper limb strength. Associated activity limitations in reaching, holding/carrying, lifting, self-care ADLs and household chores. Pt will benefit from continued skilled PT services to address deficits and improve function/QoL   OBJECTIVE IMPAIRMENTS: decreased ROM, decreased strength, hypomobility, increased edema, impaired UE functional use, postural dysfunction, and pain.    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    REHAB POTENTIAL: Excellent   CLINICAL DECISION MAKING: Evolving/moderate complexity   EVALUATION COMPLEXITY: Moderate     GOALS: Goals reviewed with patient? Yes   SHORT TERM GOALS: Target date: 11/09/2022   Pt will be independent with HEP to improve mobility and decrease pain to improve pain-free function at home Baseline: 10/24/22: Baseline HEP reviewed and demonstrated.  Goal status: INITIAL  LONG TERM GOALS: Target date: 01/18/2023   Pt will increase FOTO to at least 60 to demonstrate significant improvement in  function at home and work related to neck pain  Baseline: 10/24/22: 42 Goal status: INITIAL   2.  Pt will decrease worst shoulder pain to no more than 2-3/10 on the NPRS in order to demonstrate clinically significant reduction in shoulder pain. Baseline: 10/24/22: 10/10 pain at worst  Goal status: INITIAL   3.  Pt will have R shoulder AROM within at least 10 degrees of contralateral upper limb without reproduction of shoulder pain as needed for reaching, self-care ADLs, and household chore performance Baseline: 10/24/22: Decreased PROM, no AROM testing due to current post-op timeline.  Goal status: INITIAL   4. Pt will increase strength for R shoulder to at least 4+/5 or greater grade in order to demonstrate improvement in strength and function as needed for lifting and carrying task Baseline: 10/24/22: Strength < 2 based on limited ROM (formal MMT deferred due to early post-op status).  Goal status: INITIAL     PLAN: PT FREQUENCY: 1-2x/week   PT DURATION: 12 weeks   PLANNED INTERVENTIONS: Therapeutic exercises, Therapeutic activity, Neuromuscular re-education,  Patient/Family education, Self Care, Joint mobilization, Joint manipulation, Dry Needling, Electrical stimulation, Cryotherapy, Moist heat, Manual therapy, and Re-evaluation.   PLAN FOR NEXT SESSION: R shoulder and elbow PROM, periscapular isometrics, progress with AAROM as tolerated with precaution for aggressive shoulder hyperextension and ER    Valentina Gu, PT, DPT [id number]  Eilleen Kempf, PT 11/03/2022, 1:03 PM

## 2022-11-07 ENCOUNTER — Ambulatory Visit: Payer: BLUE CROSS/BLUE SHIELD | Admitting: Physical Therapy

## 2022-11-07 ENCOUNTER — Encounter: Payer: Self-pay | Admitting: Physical Therapy

## 2022-11-07 DIAGNOSIS — M25511 Pain in right shoulder: Secondary | ICD-10-CM

## 2022-11-07 DIAGNOSIS — M6281 Muscle weakness (generalized): Secondary | ICD-10-CM

## 2022-11-07 DIAGNOSIS — M25611 Stiffness of right shoulder, not elsewhere classified: Secondary | ICD-10-CM

## 2022-11-07 NOTE — Therapy (Unsigned)
OUTPATIENT PHYSICAL THERAPY TREATMENT NOTE   Patient Name: Anita Mendoza MRN: [id number] DOB:12-16-1970, 52 y.o., female Today's Date: 11/07/2022  PCP:  No PCP on file  REFERRING PROVIDER: Blenda Mounts, MD   END OF SESSION:   PT End of Session - 11/07/22 1023     Visit Number 4    Number of Visits 25    Date for PT Re-Evaluation 01/16/23    Authorization Type [employer] 2024    Progress Note Due on Visit 10    PT Start Time [id number]    PT Stop Time 1030    PT Time Calculation (min) 28 min    Activity Tolerance Patient tolerated treatment well    Behavior During Therapy WFL for tasks assessed/performed              Past Medical History:  Diagnosis Date   Asthma 2014   Hypertension    Past Surgical History:  Procedure Laterality Date   ABDOMINAL HYSTERECTOMY     carpul tunnel Bilateral 2011   choleycystectomy N/A 2000   ROTATOR CUFF REPAIR Left 2012   TONSILLECTOMY N/A 1979   There are no problems to display for this patient.   REFERRING DIAG: M25.511 (ICD-10-CM) - Acute pain of right shoulder   THERAPY DIAG:  Acute pain of right shoulder  Stiffness of right shoulder, not elsewhere classified  Muscle weakness (generalized)  Rationale for Evaluation and Treatment Rehabilitation  PERTINENT HISTORY: Pt is a 52 year old female s/p Right shoulder diagnostic arthroscopy, debridement, rotator cuff repair upper border subscapularis, and open biceps tenodesis.  Patient reports notable post-operative pain - she reports having pain at night at this time. She reports using Gabapentin for pain control; she is avoiding use of oxycodone. Pt has cold compression machine to use at home. Patient reports notable pain/swelling along R upper trapezius region. Pt reports noticing bruising along top of arm or along distal anterior arm after getting rid of her abduction pillow. Pt reports history of numbness from head of radius down to her hand - this has not been  apparent following her surgery.    PAIN:  Pain Intensity: Present: 5/10, Best: 0/10, Worst: 10/10 Pain location: R UT, R paracervical region, R anterior shoulder and along ACJ region Pain Quality: aching  Radiating: No  Numbness/Tingling: No Aggravating factors: discomfort in her sling Relieving factors: sitting and at rest , ice  24-hour pain behavior: worse in PM  History of prior shoulder or neck/shoulder injury, pain, surgery, or therapy: Yes, hx of L shoulder arthroscopy 2011 Falls: Has patient fallen in last 6 months? No, Number of falls: N/A Dominant hand: right Imaging: No , no imaging since her surgery  Red flags (personal history of cancer, chills/fever, night sweats, nausea, vomiting, unrelenting pain): Negative   PRECAUTIONS: None   WEIGHT BEARING RESTRICTIONS: Yes NWB RUE   FALLS: Has patient fallen in last 6 months? 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 She reports tolerating previous PT session well. She feels that she has done okay with her home exercises so far. Pt reports 8/10 pain at arrival along bony prominence/ACJ region in R shoulder. She has some NT into ulnar distribution of R hand. She voices  concern with suture sticking out of proximal portion of her incision along R biceps.    PAIN:  Are you having pain? 8/10 R shoulder    OBJECTIVE: (objective measures completed at initial evaluation unless otherwise dated)     Patient Surveys  FOTO: 22, predicted outcome score of 60   Cognition Patient is oriented to person, place, and time.  Recent memory is intact.  Remote memory is intact.  Attention span and concentration are intact.  Expressive speech is intact.  Patient's fund of knowledge is within normal limits for educational level.                          Gross Musculoskeletal Assessment Tremor: None Bulk: Normal Tone: Normal     Posture Self-selected rounded shoulder posture, inc thoracic kyphosis; anteriorly tilted scapulae     PROM     PROM (Normal range in degrees) PROM 10/24/2022    Right Left  Shoulder      Flexion 120 deg    Extension      Abduction      External Rotation 30 deg    Internal Rotation 45 deg    Hands Behind Head      Hands Behind Back             Elbow      Flexion WNL    Extension WNL    Pronation WNL    Supination WNL    (* = pain; Blank rows = not tested)     MMT DEFERRED DUE TO POST-OP STATUS LE MMT: MMT (out of 5) Right 10/24/2022 Left 10/24/2022  Shoulder   Flexion      Extension      Abduction      External rotation      Internal rotation      Horizontal abduction      Horizontal adduction      Lower Trapezius      Rhomboids             Elbow  Flexion      Extension      Pronation      Supination             (* = pain; Blank rows = not tested)   Sensation Grossly intact to light touch bilateral UE as determined by testing dermatomes C2-T2. Proprioception and hot/cold testing deferred on this date.   Reflexes Deferred   Palpation Location LEFT  RIGHT           Subocciptials      Cervical paraspinals      Upper Trapezius   1  Levator Scapulae   1  Rhomboid Major/Minor      Sternoclavicular  joint   0  Acromioclavicular joint   1  Coracoid process   2  Long head of biceps   2  Supraspinatus   2  Infraspinatus      Subscapularis      Teres Minor      Teres Major      Pectoralis Major      Pectoralis Minor  Anterior Deltoid   1  Lateral Deltoid   1  Posterior Deltoid   1  Latissimus Dorsi      Sternocleidomastoid      (Blank rows = not tested) Graded on 0-4 scale (0 = no pain, 1 = pain, 2 = pain with wincing/grimacing/flinching, 3 = pain with withdrawal, 4 = unwilling to allow palpation), (Blank rows = not tested)       SPECIAL TESTS Deferred       TODAY'S TREATMENT     Cold pack (unbilled) - for anti-inflammatory and analgesic effect as needed for reduced pain and improved ability to participate in active PT intervention, draped along R in reclined position, x 5 minutes     Manual Therapy - for shoulder ROM, prevention of shoulder stiffness post-operatively   R shoulder PROM into flexion, scaption, and ER/IR within pt tolerance; x 15 minutes Gentle STM R upper trapezius anterior deltoid and biceps x 8 minutes   Therapeutic Exercise - for R shoulder ROM, periscapular isometrics    Pulleys, seated; forward flexion AAROM; x20  Table slide, seated; x10 with pillow case under hand to allow for less resistance  *not today* Pendulums; x20 ea, fwd-bwd and circles clockwise/counterclockwise Upper trapezius stretch, R; 2x30 sec  Seated scapular retractions; 2x10, 3 sec hold  Elbow flexion AAROM, opposite UE assist for ROM; 1x10   PATIENT EDUCATION: Discussed at length patient's current condition and strategies for pain control/positioning. Reviewed strategy for offloading cubital tunnel with use of folded up fabric in sling around elbow. Follow-up with referring office was recommended for suture sticking out of incision - pt was advised to leave it alone until f/u early next week. We reviewed signs of infection for which to monitor along surgical site.           PATIENT EDUCATION:  Education details: see above for patient education details Person educated: Patient and Spouse Education method: Explanation, Demonstration, and Handouts Education comprehension: verbalized understanding and returned demonstration     HOME EXERCISE PROGRAM:  Access Code: [email or web address] URL: [email or web address] Date: 11/03/2022 Prepared by: Valentina Gu  Exercises - Flexion-Extension Shoulder Pendulum with Table Support  - 2 x daily - 7 x weekly - 2 sets - 10 reps - Seated Scapular Retraction  - 2 x daily - 7 x weekly - 2 sets - 10 reps - Seated Upper Trapezius Stretch  - 2 x daily - 7 x weekly - 3 sets - 30sec hold - Seated Elbow Flexion AAROM  - 1 x daily - 7 x weekly - 2 sets - 10 reps - Seated Shoulder Flexion Towel Slide at Table Top  - 1 x daily - 7 x weekly - 2 sets - 10 reps - 3sec hold    ASSESSMENT:   CLINICAL IMPRESSION: Patient is progressing as expected with ROM currently. Pt has no significant ROM restrictions with elbow or shoulder at this time; we are progressing ROM conservatively to protect biceps at this time. No resistance or load along biceps at this time; this was emphasized with pt education today as well. Pt has remaining post-operative impairments including: R shoulder pain, edema, localized inflammation, decreased ROM, postural changes, decreased R upper limb strength. Associated activity limitations in reaching, holding/carrying, lifting, self-care ADLs and household chores. Pt will benefit from continued skilled PT services to address deficits and improve function/QoL   OBJECTIVE IMPAIRMENTS: decreased ROM, decreased strength, hypomobility, increased edema, impaired UE functional use, postural dysfunction, and pain.    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    REHAB POTENTIAL: Excellent   CLINICAL DECISION MAKING: Evolving/moderate complexity   EVALUATION COMPLEXITY: Moderate     GOALS: Goals reviewed with patient? Yes   SHORT TERM GOALS: Target date: 11/09/2022   Pt will be independent with HEP to improve mobility and decrease pain to improve pain-free function at home Baseline: 10/24/22: Baseline HEP reviewed and demonstrated.  Goal status: INITIAL     LONG TERM GOALS: Target date: 01/18/2023   Pt will increase FOTO to at least 60 to demonstrate significant improvement in function at home and work related to neck pain  Baseline: 10/24/22: 42 Goal status: INITIAL   2.  Pt will decrease worst shoulder pain to no more than 2-3/10 on the NPRS in order to demonstrate clinically significant reduction in shoulder pain. Baseline: 10/24/22: 10/10 pain at worst  Goal status: INITIAL   3.  Pt will have R shoulder AROM within at least 10 degrees of contralateral upper limb without reproduction of shoulder pain as needed for reaching, self-care ADLs, and household chore performance Baseline: 10/24/22: Decreased PROM, no AROM testing due to current post-op timeline.  Goal status: INITIAL   4. Pt will increase strength for R shoulder to at least 4+/5 or greater grade in order to demonstrate improvement in strength and function as needed for lifting and carrying task Baseline: 10/24/22: Strength < 2 based on limited ROM (formal MMT deferred due to early post-op status).  Goal status: INITIAL     PLAN: PT FREQUENCY: 1-2x/week   PT DURATION: 12 weeks   PLANNED INTERVENTIONS: Therapeutic exercises, Therapeutic activity, Neuromuscular re-education,  Patient/Family education, Self Care, Joint mobilization, Joint manipulation, Dry Needling, Electrical stimulation, Cryotherapy, Moist heat, Manual therapy, and  Re-evaluation.   PLAN FOR NEXT SESSION: R shoulder and elbow PROM, periscapular isometrics, progress with AAROM as tolerated with precaution for aggressive shoulder hyperextension and ER    Valentina Gu, PT, DPT [id number]  Eilleen Kempf, PT 11/07/2022, 10:23 AM

## 2022-11-14 ENCOUNTER — Ambulatory Visit: Payer: Medicaid Other | Attending: Orthopedic Surgery | Admitting: Physical Therapy

## 2022-11-14 DIAGNOSIS — M25611 Stiffness of right shoulder, not elsewhere classified: Secondary | ICD-10-CM | POA: Diagnosis present

## 2022-11-14 DIAGNOSIS — M25511 Pain in right shoulder: Secondary | ICD-10-CM | POA: Insufficient documentation

## 2022-11-14 DIAGNOSIS — M6281 Muscle weakness (generalized): Secondary | ICD-10-CM | POA: Insufficient documentation

## 2022-11-14 NOTE — Therapy (Signed)
OUTPATIENT PHYSICAL THERAPY TREATMENT NOTE   Patient Name: Anita Mendoza MRN: [id number] DOB:1970/10/24, 52 y.o., female Today's Date: 11/14/2022  PCP:  No PCP on file  REFERRING PROVIDER: Blenda Mounts, MD   END OF SESSION:   PT End of Session - 11/14/22 1531     Visit Number 5    Number of Visits 25    Date for PT Re-Evaluation 01/16/23    Authorization Type [employer] 2024    Progress Note Due on Visit 10    PT Start Time 1519    PT Stop Time 1559    PT Time Calculation (min) 40 min    Activity Tolerance Patient tolerated treatment well    Behavior During Therapy [facility] for tasks assessed/performed               Past Medical History:  Diagnosis Date   Asthma 2014   Hypertension    Past Surgical History:  Procedure Laterality Date   ABDOMINAL HYSTERECTOMY     carpul tunnel Bilateral 2011   choleycystectomy N/A 2000   ROTATOR CUFF REPAIR Left 2012   TONSILLECTOMY N/A 1979   There are no problems to display for this patient.   REFERRING DIAG: M25.511 (ICD-10-CM) - Acute pain of right shoulder   THERAPY DIAG:  Acute pain of right shoulder  Stiffness of right shoulder, not elsewhere classified  Muscle weakness (generalized)  Rationale for Evaluation and Treatment Rehabilitation  PERTINENT HISTORY: Pt is a 52 year old female s/p Right shoulder diagnostic arthroscopy, debridement, rotator cuff repair upper border subscapularis, and open biceps tenodesis.  Patient reports notable post-operative pain - she reports having pain at night at this time. She reports using Gabapentin for pain control; she is avoiding use of oxycodone. Pt has cold compression machine to use at home. Patient reports notable pain/swelling along R upper trapezius region. Pt reports noticing bruising along top of arm or along distal anterior arm after getting rid of her abduction pillow. Pt reports history of numbness from head of radius down to her hand - this has not been  apparent following her surgery.    PAIN:  Pain Intensity: Present: 5/10, Best: 0/10, Worst: 10/10 Pain location: R UT, R paracervical region, R anterior shoulder and along ACJ region Pain Quality: aching  Radiating: No  Numbness/Tingling: No Aggravating factors: discomfort in her sling Relieving factors: sitting and at rest , ice  24-hour pain behavior: worse in PM  History of prior shoulder or neck/shoulder injury, pain, surgery, or therapy: Yes, hx of L shoulder arthroscopy 2011 Falls: Has patient fallen in last 6 months? No, Number of falls: N/A Dominant hand: right Imaging: No , no imaging since her surgery  Red flags (personal history of cancer, chills/fever, night sweats, nausea, vomiting, unrelenting pain): Negative   PRECAUTIONS: None   WEIGHT BEARING RESTRICTIONS: Yes NWB RUE   FALLS: Has patient fallen in last 6 months? 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 She reports some numbness affecting first/second digit. She reports more pain with active abduction of her R arm. She reports pain with reaching behind her back. She states her nocturnal pain is better. She reports pain locally along shoulder/middle deltoid region more in the AM.     PAIN:  Are you having pain? 0/10 pain at rest, 6/10 pain with AAROm   OBJECTIVE: (objective measures completed at initial evaluation unless otherwise dated)     Patient Surveys  FOTO: 68, predicted outcome score of 60   Cognition Patient is oriented to person, place, and time.  Recent memory is intact.  Remote memory is intact.  Attention span and concentration are intact.  Expressive speech is intact.  Patient's fund of knowledge is within normal limits for educational level.                          Gross Musculoskeletal Assessment Tremor: None Bulk: Normal Tone: Normal     Posture Self-selected rounded shoulder posture, inc thoracic kyphosis; anteriorly tilted scapulae     PROM     PROM (Normal range in degrees) PROM 10/24/2022    Right Left  Shoulder      Flexion 120 deg    Extension      Abduction      External Rotation 30 deg    Internal Rotation 45 deg    Hands Behind Head      Hands Behind Back             Elbow      Flexion WNL    Extension WNL    Pronation WNL    Supination WNL    (* = pain; Blank rows = not tested)     MMT DEFERRED DUE TO POST-OP STATUS LE MMT: MMT (out of 5) Right 10/24/2022 Left 10/24/2022  Shoulder   Flexion      Extension      Abduction      External rotation      Internal rotation      Horizontal abduction      Horizontal adduction      Lower Trapezius      Rhomboids             Elbow  Flexion      Extension      Pronation      Supination             (* = pain; Blank rows = not tested)   Sensation Grossly intact to light touch bilateral UE as determined by testing dermatomes C2-T2. Proprioception and hot/cold testing deferred on this date.   Reflexes Deferred   Palpation Location LEFT  RIGHT           Subocciptials      Cervical paraspinals      Upper Trapezius   1  Levator Scapulae   1  Rhomboid Major/Minor      Sternoclavicular joint   0  Acromioclavicular joint   1  Coracoid process   2   Long head of biceps   2  Supraspinatus   2  Infraspinatus      Subscapularis      Teres Minor      Teres Major      Pectoralis Major      Pectoralis Minor      Anterior Deltoid   1  Lateral Deltoid   1  Posterior Deltoid   1  Latissimus Dorsi      Sternocleidomastoid      (Blank rows = not tested) Graded on 0-4 scale (0 = no pain, 1 = pain, 2 = pain with wincing/grimacing/flinching, 3 = pain with withdrawal, 4 = unwilling to allow palpation), (Blank rows = not tested)       SPECIAL TESTS Deferred       TODAY'S TREATMENT      MHP (unbilled) utilized pre-treatment per protocol for analgesic effect and improved soft tissue extensibility/pliability; x 5 minutes .      Manual Therapy - for shoulder ROM, prevention of shoulder stiffness post-operatively   R shoulder PROM into flexion, scaption, and ER/IR within pt tolerance; x 10 minutes Glenohumeral joint mobilizations, gr I-II for pain control, A-P and inferior; x 30 sec each  Gentle STM R upper trapezius anterior deltoid and biceps x 3 minutes   Therapeutic Exercise - for R shoulder ROM, periscapular isometrics     Wand ER with dowel; 1x10  Hands-clasped shoulder flexion AAROM, R hand supinated; 2x10  Pulleys, seated; forward flexion AAROM; x20   Pendulums with elbow supported; "rock the baby"; x20 CW and CCW   *not today* Table slide, seated; x10 with pillow case under hand to allow for less resistance Pendulums; x20 ea, fwd-bwd and circles clockwise/counterclockwise Upper trapezius stretch, R; 2x30 sec  Seated scapular retractions; 2x10, 3 sec hold  Elbow flexion AAROM, opposite UE assist for ROM; 1x10   PATIENT EDUCATION: HEP update and review       PATIENT EDUCATION:  Education details: see above for patient education details Person educated: Patient and Spouse Education method: Explanation, Demonstration, and Handouts Education comprehension: verbalized understanding and returned demonstration      HOME EXERCISE PROGRAM:  Access Code: [email or web address] URL: [email or web address] Date: 11/03/2022 Prepared by: Valentina Gu  Exercises - Flexion-Extension Shoulder Pendulum with Table Support  - 2 x daily - 7 x weekly - 2 sets - 10 reps - Seated Scapular Retraction  - 2 x daily - 7 x weekly - 2 sets - 10 reps - Seated Upper Trapezius Stretch  - 2 x daily - 7 x weekly - 3 sets - 30sec hold - Seated Elbow Flexion AAROM  - 1 x daily - 7 x weekly - 2 sets - 10 reps - Seated Shoulder Flexion Towel Slide at Table Top  - 1 x daily - 7 x weekly - 2 sets - 10 reps - 3sec hold    ASSESSMENT:   CLINICAL IMPRESSION: Patient has struggled with pain control over the last few days and is concerned with suture erupting from mid-length of her incision along anterior arm. No sign of infection is noted at this time; pt was advised to not attempt to remove this and to let it come out on its own. We discussed at length strategies for pain control along with continuing medical management guidelines given by her physician. Pt is able to take sling off while at home to limit discomfort in elbow and upper trapezius with prolonged time in sling. Pt is nearing 4-week timeline for sling and is permitted to discharge sling at that time per protocol. Pt has remaining post-operative impairments including: R shoulder pain, edema, localized inflammation, decreased ROM, postural changes, decreased R upper limb strength. Associated activity limitations in reaching, holding/carrying, lifting, self-care ADLs and household chores. Pt will benefit from continued skilled PT services to address deficits and improve function/QoL   OBJECTIVE IMPAIRMENTS: decreased ROM, decreased  strength, hypomobility, increased edema, impaired UE functional use, postural dysfunction, and pain.    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    REHAB POTENTIAL: Excellent   CLINICAL DECISION MAKING: Evolving/moderate complexity   EVALUATION COMPLEXITY: Moderate     GOALS: Goals reviewed with patient? Yes   SHORT TERM GOALS: Target date: 11/09/2022   Pt will be independent with HEP to improve mobility and decrease pain to improve pain-free function at home Baseline: 10/24/22: Baseline HEP reviewed and demonstrated.  Goal status: INITIAL     LONG TERM GOALS: Target date: 01/18/2023   Pt will increase FOTO to at least 60 to demonstrate significant improvement in function at home and work related to neck pain  Baseline: 10/24/22: 42 Goal status: INITIAL   2.  Pt will decrease worst shoulder pain to no more than 2-3/10 on the NPRS in order to demonstrate clinically significant reduction in shoulder pain. Baseline: 10/24/22: 10/10 pain at worst  Goal status: INITIAL   3.  Pt will have R shoulder AROM within at least 10 degrees of contralateral upper limb without reproduction of shoulder pain as needed for reaching, self-care ADLs, and household chore performance Baseline: 10/24/22: Decreased PROM, no AROM testing due to current post-op timeline.  Goal status: INITIAL   4. Pt will increase strength for R shoulder to at least 4+/5 or greater grade in order to demonstrate improvement in strength and function as needed for lifting and carrying task Baseline: 10/24/22: Strength < 2 based on limited ROM (formal MMT deferred due to early post-op status).  Goal status: INITIAL     PLAN: PT FREQUENCY: 1-2x/week   PT DURATION: 12 weeks   PLANNED INTERVENTIONS: Therapeutic exercises, Therapeutic activity, Neuromuscular re-education,  Patient/Family education, Self Care, Joint mobilization, Joint manipulation, Dry Needling, Electrical  stimulation, Cryotherapy, Moist heat, Manual therapy, and Re-evaluation.   PLAN FOR NEXT SESSION: R shoulder and elbow PROM, periscapular isometrics, progress with AAROM as tolerated with precaution for aggressive shoulder hyperextension and ER    Valentina Gu, PT, DPT [id number]  Eilleen Kempf, PT 11/14/2022, 3:32 PM

## 2022-11-16 ENCOUNTER — Encounter: Payer: Self-pay | Admitting: Physical Therapy

## 2022-11-16 ENCOUNTER — Ambulatory Visit: Payer: Medicaid Other | Admitting: Physical Therapy

## 2022-11-16 DIAGNOSIS — M6281 Muscle weakness (generalized): Secondary | ICD-10-CM

## 2022-11-16 DIAGNOSIS — M25511 Pain in right shoulder: Secondary | ICD-10-CM | POA: Diagnosis not present

## 2022-11-16 DIAGNOSIS — M25611 Stiffness of right shoulder, not elsewhere classified: Secondary | ICD-10-CM

## 2022-11-16 NOTE — Therapy (Signed)
OUTPATIENT PHYSICAL THERAPY TREATMENT NOTE   Patient Name: Anita Mendoza MRN: [id number] DOB:1971/03/07, 52 y.o., female Today's Date: 11/16/2022  PCP:  No PCP on file  REFERRING PROVIDER: Blenda Mounts, MD   END OF SESSION:   PT End of Session - 11/16/22 1033     Visit Number 6    Number of Visits 25    Date for PT Re-Evaluation 01/16/23    Authorization Type [employer] 2024    Progress Note Due on Visit 10    PT Start Time [id number]    PT Stop Time 1117    PT Time Calculation (min) 40 min    Activity Tolerance Patient tolerated treatment well    Behavior During Therapy [facility] for tasks assessed/performed                Past Medical History:  Diagnosis Date   Asthma 2014   Hypertension    Past Surgical History:  Procedure Laterality Date   ABDOMINAL HYSTERECTOMY     carpul tunnel Bilateral 2011   choleycystectomy N/A 2000   ROTATOR CUFF REPAIR Left 2012   TONSILLECTOMY N/A 1979   There are no problems to display for this patient.   REFERRING DIAG: M25.511 (ICD-10-CM) - Acute pain of right shoulder   THERAPY DIAG:  Acute pain of right shoulder  Stiffness of right shoulder, not elsewhere classified  Muscle weakness (generalized)  Rationale for Evaluation and Treatment Rehabilitation  PERTINENT HISTORY: Pt is a 52 year old female s/p Right shoulder diagnostic arthroscopy, debridement, rotator cuff repair upper border subscapularis, and open biceps tenodesis.  Patient reports notable post-operative pain - she reports having pain at night at this time. She reports using Gabapentin for pain control; she is avoiding use of oxycodone. Pt has cold compression machine to use at home. Patient reports notable pain/swelling along R upper trapezius region. Pt reports noticing bruising along top of arm or along distal anterior arm after getting rid of her abduction pillow. Pt reports history of numbness from head of radius down to her hand - this has not been  apparent following her surgery.    PAIN:  Pain Intensity: Present: 5/10, Best: 0/10, Worst: 10/10 Pain location: R UT, R paracervical region, R anterior shoulder and along ACJ region Pain Quality: aching  Radiating: No  Numbness/Tingling: No Aggravating factors: discomfort in her sling Relieving factors: sitting and at rest , ice  24-hour pain behavior: worse in PM  History of prior shoulder or neck/shoulder injury, pain, surgery, or therapy: Yes, hx of L shoulder arthroscopy 2011 Falls: Has patient fallen in last 6 months? No, Number of falls: N/A Dominant hand: right Imaging: No , no imaging since her surgery  Red flags (personal history of cancer, chills/fever, night sweats, nausea, vomiting, unrelenting pain): Negative   PRECAUTIONS: None   WEIGHT BEARING RESTRICTIONS: Yes NWB RUE   FALLS: Has patient fallen in last 6 months? 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 Patient reports pain along ACJ/proximal humerus region. She reports tolerating AAROM well recently. She reports compliance with her HEP. She reports getting very little sleep the night before and states she is not sleeping well at this time. Pt has f/u with MD this  afternoon.   PAIN:  Are you having pain? 6/10 pain    OBJECTIVE: (objective measures completed at initial evaluation unless otherwise dated)     Patient Surveys  FOTO: 65, predicted outcome score of 60   Cognition Patient is oriented to person, place, and time.  Recent memory is intact.  Remote memory is intact.  Attention span and concentration are intact.  Expressive speech is intact.  Patient's fund of knowledge is within normal limits for educational level.                          Gross Musculoskeletal Assessment Tremor: None Bulk: Normal Tone: Normal     Posture Self-selected rounded shoulder posture, inc thoracic kyphosis; anteriorly tilted scapulae     PROM     PROM (Normal range in degrees) PROM 10/24/2022    Right Left  Shoulder      Flexion 120 deg    Extension      Abduction      External Rotation 30 deg    Internal Rotation 45 deg    Hands Behind Head      Hands Behind Back             Elbow      Flexion WNL    Extension WNL    Pronation WNL    Supination WNL    (* = pain; Blank rows = not tested)     MMT DEFERRED DUE TO POST-OP STATUS LE MMT: MMT (out of 5) Right 10/24/2022 Left 10/24/2022  Shoulder   Flexion      Extension      Abduction      External rotation      Internal rotation      Horizontal abduction      Horizontal adduction      Lower Trapezius      Rhomboids             Elbow  Flexion      Extension      Pronation      Supination             (* = pain; Blank rows = not tested)   Sensation Grossly intact to light touch bilateral UE as determined by testing dermatomes C2-T2. Proprioception and hot/cold testing deferred on this date.   Reflexes Deferred   Palpation Location LEFT  RIGHT           Subocciptials      Cervical paraspinals      Upper Trapezius   1  Levator Scapulae   1  Rhomboid Major/Minor      Sternoclavicular joint   0  Acromioclavicular joint   1  Coracoid process   2  Long head of biceps    2  Supraspinatus   2  Infraspinatus      Subscapularis      Teres Minor      Teres Major      Pectoralis Major      Pectoralis Minor      Anterior Deltoid   1  Lateral Deltoid   1  Posterior Deltoid  1  Latissimus Dorsi      Sternocleidomastoid      (Blank rows = not tested) Graded on 0-4 scale (0 = no pain, 1 = pain, 2 = pain with wincing/grimacing/flinching, 3 = pain with withdrawal, 4 = unwilling to allow palpation), (Blank rows = not tested)       SPECIAL TESTS Deferred       TODAY'S TREATMENT       Manual Therapy - for shoulder ROM, prevention of shoulder stiffness post-operatively   Gentle GHJ distraction, low-grade for pain relief; x 3 minutes R shoulder PROM into flexion, scaption, and ER/IR within pt tolerance; x 8 minutes Glenohumeral joint mobilizations, gr I-II for pain control, A-P and inferior; 2 x 30 sec each  Gentle STM R middle/anterior deltoid and biceps x 3 minutes   Therapeutic Exercise - for R shoulder ROM, periscapular isometrics    Hands-clasped shoulder flexion AAROM, R hand supinated; 2x10  Serratus punch with dowel, supine; 2x10  Pendulums with elbow supported; "rock the baby"; x20 CW and CCW  Standing, silver physioball rollout with bilat UE, ball on tabletop for forward flexion AAROM; x10  Pulleys, seated; forward flexion AAROM; x20    PATIENT EDUCATION: We discussed ongoing use of cryotherapy at home for pain management and reviewed OTC options given by her referring provider. We discussed positioning to improve comfort with lying in her bed.    *not today* MHP (unbilled) utilized pre-treatment per protocol for analgesic effect and improved soft tissue extensibility/pliability; x 5 minutes .  Wand ER with dowel; 1x10 Table slide, seated; x10 with pillow case under hand to allow for less resistance Pendulums; x20 ea, fwd-bwd and circles clockwise/counterclockwise Upper trapezius stretch, R; 2x30 sec  Seated scapular retractions;  2x10, 3 sec hold  Elbow flexion AAROM, opposite UE assist for ROM; 1x10    Cold pack (unbilled) - for anti-inflammatory and analgesic effect as needed for reduced pain and improved ability to participate in active PT intervention, along R shoulder in sitting with R elbow propped on pillow, x 5 minutes       PATIENT EDUCATION:  Education details: see above for patient education details Person educated: Patient and Spouse Education method: Explanation, Demonstration, and Handouts Education comprehension: verbalized understanding and returned demonstration     HOME EXERCISE PROGRAM:  Access Code: [email or web address] URL: [email or web address] Date: 11/03/2022 Prepared by: Valentina Gu  Exercises - Flexion-Extension Shoulder Pendulum with Table Support  - 2 x daily - 7 x weekly - 2 sets - 10 reps - Seated Scapular Retraction  - 2 x daily - 7 x weekly - 2 sets - 10 reps - Seated Upper Trapezius Stretch  - 2 x daily - 7 x weekly - 3 sets - 30sec hold - Seated Elbow Flexion AAROM  - 1 x daily - 7 x weekly - 2 sets - 10 reps - Seated Shoulder Flexion Towel Slide at Table Top  - 1 x daily - 7 x weekly - 2 sets - 10 reps - 3sec hold    ASSESSMENT:   CLINICAL IMPRESSION: Patient has experienced ongoing R shoulder pain along region of superior glenoid rim and ACJ region. She does not have notable pain along anterior arm or bicipital groove region. She is progressing with R shoulder ROM appropriately relative to current post-op time. Pt has struggled with pain in the evening and trying to get quality sleep. She also reports notable pain in AM. We discussed strategies for pain control and positioning for her R  arm. Pt tolerates gentle AAROM exercises and modified pendulums well today. Pt has f/u with MD today and may consider other strategies for medical management as needed for pain control. Pt has remaining post-operative impairments including: R shoulder pain, edema, localized  inflammation, decreased ROM, postural changes, decreased R upper limb strength. Associated activity limitations in reaching, holding/carrying, lifting, self-care ADLs and household chores. Pt will benefit from continued skilled PT services to address deficits and improve function/QoL   OBJECTIVE IMPAIRMENTS: decreased ROM, decreased strength, hypomobility, increased edema, impaired UE functional use, postural dysfunction, and pain.    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    REHAB POTENTIAL: Excellent   CLINICAL DECISION MAKING: Evolving/moderate complexity   EVALUATION COMPLEXITY: Moderate     GOALS: Goals reviewed with patient? Yes   SHORT TERM GOALS: Target date: 11/09/2022   Pt will be independent with HEP to improve mobility and decrease pain to improve pain-free function at home Baseline: 10/24/22: Baseline HEP reviewed and demonstrated.  Goal status: INITIAL     LONG TERM GOALS: Target date: 01/18/2023   Pt will increase FOTO to at least 60 to demonstrate significant improvement in function at home and work related to neck pain  Baseline: 10/24/22: 42 Goal status: INITIAL   2.  Pt will decrease worst shoulder pain to no more than 2-3/10 on the NPRS in order to demonstrate clinically significant reduction in shoulder pain. Baseline: 10/24/22: 10/10 pain at worst  Goal status: INITIAL   3.  Pt will have R shoulder AROM within at least 10 degrees of contralateral upper limb without reproduction of shoulder pain as needed for reaching, self-care ADLs, and household chore performance Baseline: 10/24/22: Decreased PROM, no AROM testing due to current post-op timeline.  Goal status: INITIAL    4. Pt will increase strength for R shoulder to at least 4+/5 or greater grade in order to demonstrate improvement in strength and function as needed for lifting and carrying task Baseline: 10/24/22: Strength < 2 based on limited ROM (formal MMT deferred due to early post-op status).  Goal status: INITIAL     PLAN: PT FREQUENCY: 1-2x/week   PT DURATION: 12 weeks   PLANNED INTERVENTIONS: Therapeutic exercises, Therapeutic activity, Neuromuscular re-education,  Patient/Family education, Self Care, Joint mobilization, Joint manipulation, Dry Needling, Electrical stimulation, Cryotherapy, Moist heat, Manual therapy, and Re-evaluation.   PLAN FOR NEXT SESSION: A/AAROM of R shoulder and elbow, periscapular isometrics, manual therapy and modalities as needed for pain relief. Initiate light isometrics as able with successive visits. F/u on patient's visit with surgeon.     Valentina Gu, PT, DPT [id number]  Eilleen Kempf, PT 11/16/2022, 11:00 AM

## 2022-11-21 ENCOUNTER — Ambulatory Visit: Payer: Medicaid Other | Admitting: Physical Therapy

## 2022-11-21 ENCOUNTER — Encounter: Payer: Self-pay | Admitting: Physical Therapy

## 2022-11-21 DIAGNOSIS — M25511 Pain in right shoulder: Secondary | ICD-10-CM

## 2022-11-21 DIAGNOSIS — M25611 Stiffness of right shoulder, not elsewhere classified: Secondary | ICD-10-CM

## 2022-11-21 DIAGNOSIS — M6281 Muscle weakness (generalized): Secondary | ICD-10-CM

## 2022-11-21 NOTE — Therapy (Signed)
OUTPATIENT PHYSICAL THERAPY TREATMENT NOTE   Patient Name: Anita Mendoza MRN: [id number] DOB:1970/12/28, 52 y.o., female Today's Date: 11/21/2022  PCP:  No PCP on file  REFERRING PROVIDER: Gayleen Orem, MD   END OF SESSION:   PT End of Session - 11/21/22 1550     Visit Number 7    Number of Visits 25    Date for PT Re-Evaluation 01/16/23    Authorization Type [employer] 2024    Progress Note Due on Visit 10    PT Start Time 1503    PT Stop Time 1551    PT Time Calculation (min) 48 min    Activity Tolerance Patient tolerated treatment well    Behavior During Therapy [facility] for tasks assessed/performed                 Past Medical History:  Diagnosis Date   Asthma 2014   Hypertension    Past Surgical History:  Procedure Laterality Date   ABDOMINAL HYSTERECTOMY     carpul tunnel Bilateral 2011   choleycystectomy N/A 2000   ROTATOR CUFF REPAIR Left 2012   TONSILLECTOMY N/A 1979   There are no problems to display for this patient.   REFERRING DIAG: M25.511 (ICD-10-CM) - Acute pain of right shoulder   THERAPY DIAG:  Acute pain of right shoulder  Stiffness of right shoulder, not elsewhere classified  Muscle weakness (generalized)  Rationale for Evaluation and Treatment Rehabilitation  PERTINENT HISTORY: Pt is a 52 year old female s/p Right shoulder diagnostic arthroscopy, debridement, rotator cuff repair upper border subscapularis, and open biceps tenodesis.  Patient reports notable post-operative pain - she reports having pain at night at this time. She reports using Gabapentin for pain control; she is avoiding use of oxycodone. Pt has cold compression machine to use at home. Patient reports notable pain/swelling along R upper trapezius region. Pt reports noticing bruising along top of arm or along distal anterior arm after getting rid of her abduction pillow. Pt reports history of numbness from head of radius down to her hand - this has not been  apparent following her surgery.    PAIN:  Pain Intensity: Present: 5/10, Best: 0/10, Worst: 10/10 Pain location: R UT, R paracervical region, R anterior shoulder and along ACJ region Pain Quality: aching  Radiating: No  Numbness/Tingling: No Aggravating factors: discomfort in her sling Relieving factors: sitting and at rest , ice  24-hour pain behavior: worse in PM  History of prior shoulder or neck/shoulder injury, pain, surgery, or therapy: Yes, hx of L shoulder arthroscopy 2011 Falls: Has patient fallen in last 6 months? No, Number of falls: N/A Dominant hand: right Imaging: No , no imaging since her surgery  Red flags (personal history of cancer, chills/fever, night sweats, nausea, vomiting, unrelenting pain): Negative   PRECAUTIONS: None   WEIGHT BEARING RESTRICTIONS: Yes NWB RUE   FALLS: Has patient fallen in last 6 months? 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 Patient reports 7/10 pain at arrival today. Patient reports pain primarily along ACJ/superior glenoid region and along her R elbow.    PAIN:  Are you having pain?  6/10 pain    OBJECTIVE: (objective measures completed at initial evaluation unless otherwise dated)     Patient Surveys  FOTO: 65, predicted outcome score of 60   Cognition Patient is oriented to person, place, and time.  Recent memory is intact.  Remote memory is intact.  Attention span and concentration are intact.  Expressive speech is intact.  Patient's fund of knowledge is within normal limits for educational level.                          Gross Musculoskeletal Assessment Tremor: None Bulk: Normal Tone: Normal     Posture Self-selected rounded shoulder posture, inc thoracic kyphosis; anteriorly tilted scapulae     PROM     PROM (Normal range in degrees) PROM 10/24/2022    Right Left  Shoulder      Flexion 120 deg    Extension      Abduction      External Rotation 30 deg    Internal Rotation 45 deg    Hands Behind Head      Hands Behind Back             Elbow      Flexion WNL    Extension WNL    Pronation WNL    Supination WNL    (* = pain; Blank rows = not tested)     MMT DEFERRED DUE TO POST-OP STATUS LE MMT: MMT (out of 5) Right 10/24/2022 Left 10/24/2022  Shoulder   Flexion      Extension      Abduction      External rotation      Internal rotation      Horizontal abduction      Horizontal adduction      Lower Trapezius      Rhomboids             Elbow  Flexion      Extension      Pronation      Supination             (* = pain; Blank rows = not tested)   Sensation Grossly intact to light touch bilateral UE as determined by testing dermatomes C2-T2. Proprioception and hot/cold testing deferred on this date.   Reflexes Deferred   Palpation Location LEFT  RIGHT           Subocciptials      Cervical paraspinals      Upper Trapezius   1  Levator Scapulae   1  Rhomboid Major/Minor      Sternoclavicular joint   0  Acromioclavicular joint   1  Coracoid process   2  Long head of biceps   2  Supraspinatus   2  Infraspinatus       Subscapularis      Teres Minor      Teres Major      Pectoralis Major      Pectoralis Minor      Anterior Deltoid   1  Lateral Deltoid   1  Posterior Deltoid   1  Latissimus Dorsi  Sternocleidomastoid      (Blank rows = not tested) Graded on 0-4 scale (0 = no pain, 1 = pain, 2 = pain with wincing/grimacing/flinching, 3 = pain with withdrawal, 4 = unwilling to allow palpation), (Blank rows = not tested)       SPECIAL TESTS Deferred       TODAY'S TREATMENT      Electrical Stimulation with TENS for pain relief. EMPI unit, frequency 100 Hz, shoulder pre-set; completed with pt in sitting with R elbow propped on pillow sitting on arm rest. Completed with ice pack on shoulder anteriorly with arm propped; x 8 minutes     Self-care/Home Management - educated patient on use of EMPI TENS unit for pain control with appropriate setup for affected body regions and overviewed use of device along with application of electrodes    Manual Therapy - for shoulder ROM, prevention of shoulder stiffness post-operatively    *next visit* Gentle GHJ distraction, low-grade for pain relief; x 3 minutes R shoulder PROM into flexion, scaption, and ER/IR within pt tolerance; x 8 minutes Glenohumeral joint mobilizations, gr I-II for pain control, A-P and inferior; 2 x 30 sec each  Gentle STM R middle/anterior deltoid and biceps x 3 minutes    Therapeutic Exercise - for R shoulder ROM, periscapular isometrics    Pulleys, seated; forward flexion AAROM; x20   Standing, silver physioball rollout with bilat UE, ball on tabletop for forward flexion AAROM; x20  Pendulums with elbow supported; "rock the baby"; x20 CW and CCW   PATIENT EDUCATION: We discussed role of TENS for acute/short-term pain management and continued work at home on shoulder ROM. We discussed potentially moving into isometrics with subsequent visits.     *next visit* Hands-clasped shoulder flexion AAROM, R hand supinated;  2x10 Serratus punch with dowel, supine; 2x10   *not today* MHP (unbilled) utilized pre-treatment per protocol for analgesic effect and improved soft tissue extensibility/pliability; x 5 minutes .  Wand ER with dowel; 1x10 Table slide, seated; x10 with pillow case under hand to allow for less resistance Pendulums; x20 ea, fwd-bwd and circles clockwise/counterclockwise Upper trapezius stretch, R; 2x30 sec  Seated scapular retractions; 2x10, 3 sec hold  Elbow flexion AAROM, opposite UE assist for ROM; 1x10        PATIENT EDUCATION:  Education details: see above for patient education details Person educated: Patient and Spouse Education method: Explanation, Demonstration, and Handouts Education comprehension: verbalized understanding and returned demonstration     HOME EXERCISE PROGRAM:  Access Code: [email or web address] URL: [email or web address] Date: 11/03/2022 Prepared by: Consuela Mimes  Exercises - Flexion-Extension Shoulder Pendulum with Table Support  - 2 x daily - 7 x weekly - 2 sets - 10 reps - Seated Scapular Retraction  - 2 x daily - 7 x weekly - 2 sets - 10 reps - Seated Upper Trapezius Stretch  - 2 x daily - 7 x weekly - 3 sets - 30sec hold - Seated Elbow Flexion AAROM  - 1 x daily - 7 x weekly - 2 sets - 10 reps - Seated Shoulder Flexion Towel Slide at Table Top  - 1 x daily - 7 x weekly - 2 sets - 10 reps - 3sec hold    ASSESSMENT:   CLINICAL IMPRESSION: Patient was informed that pain is expected to improve in the coming weeks following her arthroscopy/biceps tenodesis. Patient has pain with return to neutral from elevated position of her shoulder that does not improve with modifications attempted so far; pt is  able to tolerate AAROM drills with utilizing contralateral limb to take on most of the effort with limited activity of R upper limb. We discussed use of at-home TENS for pain control and reviewed use of EMPI unit that we are able to loan out to patient  for pain control given ongoing difficulties with pain management. No re-injury or biceps tendon injury is suspected at this time. We are progressing conservatively within parameters of post-operative protocol. Pt has remaining post-operative impairments including: R shoulder pain, edema, localized inflammation, decreased ROM, postural changes, decreased R upper limb strength. Associated activity limitations in reaching, holding/carrying, lifting, self-care ADLs and household chores. Pt will benefit from continued skilled PT services to address deficits and improve function/QoL   OBJECTIVE IMPAIRMENTS: decreased ROM, decreased strength, hypomobility, increased edema, impaired UE functional use, postural dysfunction, and pain.    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    REHAB POTENTIAL: Excellent   CLINICAL DECISION MAKING: Evolving/moderate complexity   EVALUATION COMPLEXITY: Moderate     GOALS: Goals reviewed with patient? Yes   SHORT TERM GOALS: Target date: 11/09/2022   Pt will be independent with HEP to improve mobility and decrease pain to improve pain-free function at home Baseline: 10/24/22: Baseline HEP reviewed and demonstrated.  Goal status: INITIAL     LONG TERM GOALS: Target date: 01/18/2023   Pt will increase FOTO to at least 60 to demonstrate significant improvement in function at home and work related to neck pain  Baseline: 10/24/22: 42 Goal status: INITIAL   2.  Pt will decrease worst shoulder pain to no more than 2-3/10 on the NPRS in order to demonstrate clinically significant reduction in shoulder pain. Baseline: 10/24/22: 10/10 pain at worst  Goal status: INITIAL   3.  Pt will  have R shoulder AROM within at least 10 degrees of contralateral upper limb without reproduction of shoulder pain as needed for reaching, self-care ADLs, and household chore performance Baseline: 10/24/22: Decreased PROM, no AROM testing due to current post-op timeline.  Goal status: INITIAL   4. Pt will increase strength for R shoulder to at least 4+/5 or greater grade in order to demonstrate improvement in strength and function as needed for lifting and carrying task Baseline: 10/24/22: Strength < 2 based on limited ROM (formal MMT deferred due to early post-op status).  Goal status: INITIAL     PLAN: PT FREQUENCY: 1-2x/week   PT DURATION: 12 weeks   PLANNED INTERVENTIONS: Therapeutic exercises, Therapeutic activity, Neuromuscular re-education,  Patient/Family education, Self Care, Joint mobilization, Joint manipulation, Dry Needling, Electrical stimulation, Cryotherapy, Moist heat, Manual therapy, and Re-evaluation.   PLAN FOR NEXT SESSION: A/AAROM of R shoulder and elbow, periscapular isometrics, manual therapy and modalities as needed for pain relief. Initiate light isometrics as able with successive visits.     Consuela Mimes, PT, DPT #[id number]  Gertie Exon, PT 11/21/2022, 3:50 PM

## 2022-11-23 ENCOUNTER — Ambulatory Visit: Payer: Medicaid Other | Admitting: Physical Therapy

## 2022-11-23 NOTE — Therapy (Deleted)
OUTPATIENT PHYSICAL THERAPY TREATMENT NOTE   Patient Name: Anita Mendoza MRN: [id number] DOB:14-Mar-1971, 52 y.o., female Today's Date: 11/23/2022  PCP:  No PCP on file  REFERRING PROVIDER: Gayleen Orem, MD   END OF SESSION:         Past Medical History:  Diagnosis Date   Asthma 2014   Hypertension    Past Surgical History:  Procedure Laterality Date   ABDOMINAL HYSTERECTOMY     carpul tunnel Bilateral 2011   choleycystectomy N/A 2000   ROTATOR CUFF REPAIR Left 2012   TONSILLECTOMY N/A 1979   There are no problems to display for this patient.   REFERRING DIAG: M25.511 (ICD-10-CM) - Acute pain of right shoulder   THERAPY DIAG:  Acute pain of right shoulder  Stiffness of right shoulder, not elsewhere classified  Muscle weakness (generalized)  Rationale for Evaluation and Treatment Rehabilitation  PERTINENT HISTORY: Pt is a 52 year old female s/p Right shoulder diagnostic arthroscopy, debridement, rotator cuff repair upper border subscapularis, and open biceps tenodesis.  Patient reports notable post-operative pain - she reports having pain at night at this time. She reports using Gabapentin for pain control; she is avoiding use of oxycodone. Pt has cold compression machine to use at home. Patient reports notable pain/swelling along R upper trapezius region. Pt reports noticing bruising along top of arm or along distal anterior arm after getting rid of her abduction pillow. Pt reports history of numbness from head of radius down to her hand - this has not been apparent following her surgery.    PAIN:  Pain Intensity: Present: 5/10, Best: 0/10, Worst: 10/10 Pain location: R UT, R paracervical region, R anterior shoulder and along ACJ region Pain Quality: aching  Radiating: No  Numbness/Tingling: No Aggravating factors: discomfort in her sling Relieving factors: sitting and at rest , ice  24-hour pain behavior: worse in PM  History of prior shoulder or  neck/shoulder injury, pain, surgery, or therapy: Yes, hx of L shoulder arthroscopy 2011 Falls: Has patient fallen in last 6 months? No, Number of falls: N/A Dominant hand: right Imaging: No , no imaging since her surgery  Red flags (personal history of cancer, chills/fever, night sweats, nausea, vomiting, unrelenting pain): Negative   PRECAUTIONS: None   WEIGHT BEARING RESTRICTIONS: Yes NWB RUE   FALLS: Has patient fallen in last 6 months? 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 Patient reports 7/10 pain at arrival today. Patient reports pain primarily along ACJ/superior glenoid region and along her R elbow.    PAIN:  Are you having pain? 6/10 pain    OBJECTIVE: (objective measures completed at initial evaluation unless otherwise dated)     Patient Surveys  FOTO: [id number], predicted outcome score of 60   Cognition Patient is oriented to person, place, and time.  Recent memory is intact.  Remote memory is intact.  Attention span and concentration are intact.  Expressive speech is intact.  Patient's fund of knowledge is within normal  limits for educational level.                          Gross Musculoskeletal Assessment Tremor: None Bulk: Normal Tone: Normal     Posture Self-selected rounded shoulder posture, inc thoracic kyphosis; anteriorly tilted scapulae     PROM     PROM (Normal range in degrees) PROM 10/24/2022    Right Left  Shoulder      Flexion 120 deg    Extension      Abduction      External Rotation 30 deg    Internal Rotation 45 deg    Hands Behind Head      Hands Behind Back             Elbow      Flexion WNL    Extension WNL    Pronation WNL    Supination WNL    (* = pain; Blank rows = not tested)     MMT DEFERRED DUE TO POST-OP STATUS LE MMT: MMT (out of 5) Right 10/24/2022 Left 10/24/2022  Shoulder   Flexion      Extension      Abduction      External rotation      Internal rotation      Horizontal abduction      Horizontal adduction      Lower Trapezius      Rhomboids             Elbow  Flexion      Extension      Pronation      Supination             (* = pain; Blank rows = not tested)   Sensation Grossly intact to light touch bilateral UE as determined by testing dermatomes C2-T2. Proprioception and hot/cold testing deferred on this date.   Reflexes Deferred   Palpation Location LEFT  RIGHT           Subocciptials      Cervical paraspinals      Upper Trapezius   1  Levator Scapulae   1  Rhomboid Major/Minor      Sternoclavicular joint   0  Acromioclavicular joint   1  Coracoid process   2  Long head of biceps   2  Supraspinatus   2  Infraspinatus      Subscapularis      Teres Minor      Teres Major      Pectoralis Major      Pectoralis Minor      Anterior Deltoid   1  Lateral Deltoid   1  Posterior Deltoid   1  Latissimus Dorsi  Sternocleidomastoid      (Blank rows = not tested) Graded on 0-4 scale (0 = no pain, 1 = pain, 2 = pain with wincing/grimacing/flinching, 3 = pain with withdrawal, 4 = unwilling to allow palpation), (Blank rows =  not tested)       SPECIAL TESTS Deferred       TODAY'S TREATMENT      Electrical Stimulation with TENS for pain relief. EMPI unit, frequency 100 Hz, shoulder pre-set; completed with pt in sitting with R elbow propped on pillow sitting on arm rest. Completed with ice pack on shoulder anteriorly with arm propped; x 8 minutes     Self-care/Home Management - educated patient on use of EMPI TENS unit for pain control with appropriate setup for affected body regions and overviewed use of device along with application of electrodes    Manual Therapy - for shoulder ROM, prevention of shoulder stiffness post-operatively    *next visit* Gentle GHJ distraction, low-grade for pain relief; x 3 minutes R shoulder PROM into flexion, scaption, and ER/IR within pt tolerance; x 8 minutes Glenohumeral joint mobilizations, gr I-II for pain control, A-P and inferior; 2 x 30 sec each  Gentle STM R middle/anterior deltoid and biceps x 3 minutes    Therapeutic Exercise - for R shoulder ROM, periscapular isometrics    Pulleys, seated; forward flexion AAROM; x20   Standing, silver physioball rollout with bilat UE, ball on tabletop for forward flexion AAROM; x20  Pendulums with elbow supported; "rock the baby"; x20 CW and CCW   PATIENT EDUCATION: We discussed role of TENS for acute/short-term pain management and continued work at home on shoulder ROM. We discussed potentially moving into isometrics with subsequent visits.     *next visit* Hands-clasped shoulder flexion AAROM, R hand supinated; 2x10 Serratus punch with dowel, supine; 2x10   *not today* MHP (unbilled) utilized pre-treatment per protocol for analgesic effect and improved soft tissue extensibility/pliability; x 5 minutes .  Wand ER with dowel; 1x10 Table slide, seated; x10 with pillow case under hand to allow for less resistance Pendulums; x20 ea, fwd-bwd and circles clockwise/counterclockwise Upper trapezius stretch, R; 2x30  sec  Seated scapular retractions; 2x10, 3 sec hold  Elbow flexion AAROM, opposite UE assist for ROM; 1x10        PATIENT EDUCATION:  Education details: see above for patient education details Person educated: Patient and Spouse Education method: Explanation, Demonstration, and Handouts Education comprehension: verbalized understanding and returned demonstration     HOME EXERCISE PROGRAM:  Access Code: [email or web address] URL: [email or web address] Date: 11/03/2022 Prepared by: Consuela Mimes  Exercises - Flexion-Extension Shoulder Pendulum with Table Support  - 2 x daily - 7 x weekly - 2 sets - 10 reps - Seated Scapular Retraction  - 2 x daily - 7 x weekly - 2 sets - 10 reps - Seated Upper Trapezius Stretch  - 2 x daily - 7 x weekly - 3 sets - 30sec hold - Seated Elbow Flexion AAROM  - 1 x daily - 7 x weekly - 2 sets - 10 reps - Seated Shoulder Flexion Towel Slide at Table Top  - 1 x daily - 7 x weekly - 2 sets - 10 reps - 3sec hold    ASSESSMENT:   CLINICAL IMPRESSION: Patient was informed that pain is expected to improve in the coming weeks following her arthroscopy/biceps tenodesis. Patient has pain with return to neutral from elevated position of her shoulder that does not improve with modifications attempted so far; pt  is able to tolerate AAROM drills with utilizing contralateral limb to take on most of the effort with limited activity of R upper limb. We discussed use of at-home TENS for pain control and reviewed use of EMPI unit that we are able to loan out to patient for pain control given ongoing difficulties with pain management. No re-injury or biceps tendon injury is suspected at this time. We are progressing conservatively within parameters of post-operative protocol. Pt has remaining post-operative impairments including: R shoulder pain, edema, localized inflammation, decreased ROM, postural changes, decreased R upper limb strength. Associated activity limitations in  reaching, holding/carrying, lifting, self-care ADLs and household chores. Pt will benefit from continued skilled PT services to address deficits and improve function/QoL   OBJECTIVE IMPAIRMENTS: decreased ROM, decreased strength, hypomobility, increased edema, impaired UE functional use, postural dysfunction, and pain.    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    REHAB POTENTIAL: Excellent   CLINICAL DECISION MAKING: Evolving/moderate complexity   EVALUATION COMPLEXITY: Moderate     GOALS: Goals reviewed with patient? Yes   SHORT TERM GOALS: Target date: 11/09/2022   Pt will be independent with HEP to improve mobility and decrease pain to improve pain-free function at home Baseline: 10/24/22: Baseline HEP reviewed and demonstrated.  Goal status: INITIAL     LONG TERM GOALS: Target date: 01/18/2023   Pt will increase FOTO to at least 60 to demonstrate significant improvement in function at home and work related to neck pain  Baseline: 10/24/22: 42 Goal status: INITIAL   2.  Pt will decrease worst shoulder pain to no more than 2-3/10 on the NPRS in order to demonstrate clinically significant reduction in shoulder pain. Baseline: 10/24/22: 10/10 pain at worst  Goal status: INITIAL   3.  Pt will have R shoulder AROM within at least 10 degrees of contralateral upper limb without reproduction of shoulder pain as needed for reaching, self-care ADLs, and household chore performance Baseline: 10/24/22: Decreased PROM, no AROM testing due to current post-op timeline.  Goal status: INITIAL   4. Pt will increase strength for R shoulder to at least 4+/5 or greater grade in order to demonstrate improvement  in strength and function as needed for lifting and carrying task Baseline: 10/24/22: Strength < 2 based on limited ROM (formal MMT deferred due to early post-op status).  Goal status: INITIAL     PLAN: PT FREQUENCY: 1-2x/week   PT DURATION: 12 weeks   PLANNED INTERVENTIONS: Therapeutic exercises, Therapeutic activity, Neuromuscular re-education,  Patient/Family education, Self Care, Joint mobilization, Joint manipulation, Dry Needling, Electrical stimulation, Cryotherapy, Moist heat, Manual therapy, and Re-evaluation.   PLAN FOR NEXT SESSION: A/AAROM of R shoulder and elbow, periscapular isometrics, manual therapy and modalities as needed for pain relief. Initiate light isometrics as able with successive visits.     Consuela Mimes, PT, DPT #[id number]  Gertie Exon, PT 11/23/2022, 12:43 PM

## 2022-11-28 ENCOUNTER — Ambulatory Visit: Payer: Medicaid Other | Admitting: Physical Therapy

## 2022-11-30 ENCOUNTER — Ambulatory Visit: Payer: Medicaid Other

## 2022-11-30 DIAGNOSIS — M25611 Stiffness of right shoulder, not elsewhere classified: Secondary | ICD-10-CM

## 2022-11-30 DIAGNOSIS — M25511 Pain in right shoulder: Secondary | ICD-10-CM | POA: Diagnosis not present

## 2022-11-30 DIAGNOSIS — M6281 Muscle weakness (generalized): Secondary | ICD-10-CM

## 2022-11-30 NOTE — Therapy (Signed)
OUTPATIENT PHYSICAL THERAPY TREATMENT NOTE   Patient Name: Anita Mendoza MRN: [id number] DOB:1971/04/21, 52 y.o., female Today's Date: 11/30/2022  PCP:  No PCP on file  REFERRING PROVIDER: Gayleen Orem, MD   END OF SESSION:   PT End of Session - 11/30/22 1347     Visit Number 8    Number of Visits 25    Date for PT Re-Evaluation 01/16/23    Authorization Type [employer] 2024    Progress Note Due on Visit 10    PT Start Time 0922    PT Stop Time 1015    PT Time Calculation (min) 53 min    Activity Tolerance Patient tolerated treatment well    Behavior During Therapy [facility] for tasks assessed/performed                  Past Medical History:  Diagnosis Date   Asthma 2014   Hypertension    Past Surgical History:  Procedure Laterality Date   ABDOMINAL HYSTERECTOMY     carpul tunnel Bilateral 2011   choleycystectomy N/A 2000   ROTATOR CUFF REPAIR Left 2012   TONSILLECTOMY N/A 1979   There are no problems to display for this patient.   REFERRING DIAG: M25.511 (ICD-10-CM) - Acute pain of right shoulder   THERAPY DIAG:  Acute pain of right shoulder  Stiffness of right shoulder, not elsewhere classified  Muscle weakness (generalized)  Rationale for Evaluation and Treatment Rehabilitation  PERTINENT HISTORY: Pt is a 52 year old female s/p Right shoulder diagnostic arthroscopy, debridement, rotator cuff repair upper border subscapularis, and open biceps tenodesis.  Patient reports notable post-operative pain - she reports having pain at night at this time. She reports using Gabapentin for pain control; she is avoiding use of oxycodone. Pt has cold compression machine to use at home. Patient reports notable pain/swelling along R upper trapezius region. Pt reports noticing bruising along top of arm or along distal anterior arm after getting rid of her abduction pillow. Pt reports history of numbness from head of radius down to her hand - this has not  been apparent following her surgery.    PAIN:  Pain Intensity: Present: 5/10, Best: 0/10, Worst: 10/10 Pain location: R UT, R paracervical region, R anterior shoulder and along ACJ region Pain Quality: aching  Radiating: No  Numbness/Tingling: No Aggravating factors: discomfort in her sling Relieving factors: sitting and at rest , ice  24-hour pain behavior: worse in PM  History of prior shoulder or neck/shoulder injury, pain, surgery, or therapy: Yes, hx of L shoulder arthroscopy 2011 Falls: Has patient fallen in last 6 months? No, Number of falls: N/A Dominant hand: right Imaging: No , no imaging since her surgery  Red flags (personal history of cancer, chills/fever, night sweats, nausea, vomiting, unrelenting pain): Negative   PRECAUTIONS: None   WEIGHT BEARING RESTRICTIONS: Yes NWB RUE   FALLS: Has patient fallen in last 6 months? No   Living Environment Lives with: lives with their spouse and 3 nephews  Lives in: House/apartment Stairs: No Has following equipment at home:  R shoulder sling   Prior level of function: Independent   Occupational demands: N/A   Hobbies: pt works in her church, Agricultural consultant work    Patient Goals: No pain at all; able to perform activities without pain     PRECAUTIONS: None   S/p right shoulder diagnostic arthroscopy, debridement, cyst removal along upper border subscapularis, and open biceps tenodesis (DOS: 10/13/22)    SUBJECTIVE:  SUBJECTIVE STATEMENT:  Pt reports of weakness, pain 7/10 pain in the R shldr in the beginning  of the session and 0/10 at rest and 2/10 with movement at the ned of the session.  PAIN:  Are you having pain? 6/10 pain    OBJECTIVE: (objective measures completed at initial evaluation unless otherwise dated)     Patient Surveys   FOTO: 5, predicted outcome score of 60   Cognition Patient is oriented to person, place, and time.  Recent memory is intact.  Remote memory is intact.  Attention span and concentration are intact.  Expressive speech is intact.  Patient's fund of knowledge is within normal limits for educational level.                          Gross Musculoskeletal Assessment Tremor: None Bulk: Normal Tone: Normal     Posture Self-selected rounded shoulder posture, inc thoracic kyphosis; anteriorly tilted scapulae     PROM     PROM (Normal range in degrees) PROM 10/24/2022    Right Left  Shoulder      Flexion 120 deg    Extension      Abduction      External Rotation 30 deg    Internal Rotation 45 deg    Hands Behind Head      Hands Behind Back             Elbow      Flexion WNL    Extension WNL    Pronation WNL    Supination WNL    (* = pain; Blank rows = not tested)     MMT DEFERRED DUE TO POST-OP STATUS LE MMT: MMT (out of 5) Right 10/24/2022 Left 10/24/2022  Shoulder   Flexion      Extension      Abduction      External rotation      Internal rotation      Horizontal abduction      Horizontal adduction      Lower Trapezius      Rhomboids             Elbow  Flexion      Extension      Pronation      Supination             (* = pain; Blank rows = not tested)   Sensation Grossly intact to light touch bilateral UE as determined by testing dermatomes C2-T2. Proprioception and hot/cold testing deferred on this date.   Reflexes Deferred   Palpation Location LEFT  RIGHT           Subocciptials      Cervical paraspinals      Upper Trapezius   1  Levator Scapulae   1  Rhomboid Major/Minor      Sternoclavicular joint   0  Acromioclavicular joint   1  Coracoid process   2  Long head of biceps   2  Supraspinatus   2  Infraspinatus      Subscapularis      Teres Minor      Teres Major      Pectoralis Major      Pectoralis Minor      Anterior Deltoid   1   Lateral Deltoid   1  Posterior Deltoid   1  Latissimus Dorsi      Sternocleidomastoid      (Blank rows = not tested) Graded  on 0-4 scale (0 = no pain, 1 = pain, 2 = pain with wincing/grimacing/flinching, 3 = pain with withdrawal, 4 = unwilling to allow palpation), (Blank rows = not tested)       SPECIAL TESTS Deferred       TODAY'S TREATMENT    PT followed [facility] PTC repair and Bicept tenodises Protocol. Pt in week 6 today. PHASE II: INTERMEDIATE POST-OP (4-6 WEEKS AFTER SURGERY) Rehabilitation  Goals  Continue to protect surgical repair  Reduce swelling, minimize pain  Maintain shoulder PROM  Minimize substitution patterns with AAROM  Patient education Sling  Neutral rotation  Use of abduction pillow in 30-45 degrees abduction  Use at night while sleeping Precautions  No lifting of objects  No supporting of body weight with hands Intervention *Continue with  Phase I  interventions Range of motion/Mobility  PROM: ER<20 scapular plane, Forward elevation <90  AAROM: Active assistive shoulder flexion, shoulder flexion with cane, cane external rotation stretch,  washcloth press, sidelying elevation to 90 degrees Strengthening  Periscapular: Row on physioball, shoulder extension on physioball Criteria to  Progress  90 degrees shoulder PROM forward elevation  20 degrees shoulder PROM ER in scapular plane  0 degrees of shoulder PROM IR in the scapular plane  Minimal substitution patterns with AAROM  Pain < 4/10  No complications with Phase II  Manual Therapy -  Jt PROM and PROM as per protocol above -STM to UT, Shldr and Levator scapula  Therapeutic exercises 23 mins: Overhead pulles x 5 mins Self assisted Flexion to 90 deg Wand exs PT assisted AAROM as per protocol.  Codemans pendulum    *next visit* Gentle GHJ distraction, low-grade for pain relief; x 3 minutes R shoulder PROM into flexion, scaption, and ER/IR within pt tolerance; x 8  minutes Glenohumeral joint mobilizations, gr I-II for pain control, A-P and inferior; 2 x 30 sec each  Gentle STM R middle/anterior deltoid and biceps x 3 minutes    Therapeutic Exercise - for R shoulder ROM, periscapular isometrics    Pulleys, seated; forward flexion AAROM; x20   Standing, silver physioball rollout with bilat UE, ball on tabletop for forward flexion AAROM; x20  Pendulums with elbow supported; "rock the baby"; x20 CW and CCW   PATIENT EDUCATION: We discussed role of TENS for acute/short-term pain management and continued work at home on shoulder ROM. We discussed potentially moving into isometrics with subsequent visits.     *next visit* Hands-clasped shoulder flexion AAROM, R hand supinated; 2x10 Serratus punch with dowel, supine; 2x10   *not today* MHP (unbilled) utilized pre-treatment per protocol for analgesic effect and improved soft tissue extensibility/pliability; x 5 minutes .  Wand ER with dowel; 1x10 Table slide, seated; x10 with pillow case under hand to allow for less resistance Pendulums; x20 ea, fwd-bwd and circles clockwise/counterclockwise Upper trapezius stretch, R; 2x30 sec  Seated scapular retractions; 2x10, 3 sec hold  Elbow flexion AAROM, opposite UE assist for ROM; 1x10        PATIENT EDUCATION:  Education details: see above for patient education details Person educated: Patient and Spouse Education method: Explanation, Demonstration, and Handouts Education comprehension: verbalized understanding and returned demonstration     HOME EXERCISE PROGRAM:  Access Code: [email or web address] URL: [email or web address] Date: 11/03/2022 Prepared by: Consuela Mimes  Exercises - Flexion-Extension Shoulder Pendulum with Table Support  - 2 x daily - 7 x weekly - 2 sets - 10 reps - Seated Scapular Retraction  - 2 x daily - 7  x weekly - 2 sets - 10 reps - Seated Upper Trapezius Stretch  - 2 x daily - 7 x weekly - 3 sets - 30sec hold -  Seated Elbow Flexion AAROM  - 1 x daily - 7 x weekly - 2 sets - 10 reps - Seated Shoulder Flexion Towel Slide at Table Top  - 1 x daily - 7 x weekly - 2 sets - 10 reps - 3sec hold    ASSESSMENT:   CLINICAL IMPRESSION: Pt was instructed by MD to D/C Sling and therefore pt presents without sling. Pt's pain imporved with Overhead pulleys in flex/ felx in scpation/ABD for 5 mins. Pt progressed pt as per [facility] Protocol.  Pt Jt integrity WNL with pain full end range and tightness in UT and levator scpual noted. STM helped pt with pain and movement in R shoulder. Pt tol tx well.  Pt will benefit from continued skilled PT services to address deficits and improve function/QoL   OBJECTIVE IMPAIRMENTS: decreased ROM, decreased strength, hypomobility, increased edema, impaired UE functional use, postural dysfunction, and pain.    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    REHAB POTENTIAL: Excellent   CLINICAL DECISION MAKING: Evolving/moderate complexity   EVALUATION COMPLEXITY: Moderate     GOALS: Goals reviewed with patient? Yes   SHORT TERM GOALS: Target date: 11/09/2022   Pt will be independent with HEP to improve mobility and decrease pain to improve pain-free function at home Baseline: 10/24/22: Baseline HEP reviewed and demonstrated.  Goal status: INITIAL     LONG TERM GOALS: Target date: 01/18/2023   Pt will increase FOTO to at least 60 to demonstrate significant improvement in function at home and work related to neck pain  Baseline: 10/24/22: 42 Goal status: INITIAL   2.  Pt will decrease worst shoulder pain to no more than 2-3/10 on the NPRS in order to demonstrate clinically significant  reduction in shoulder pain. Baseline: 10/24/22: 10/10 pain at worst  Goal status: INITIAL   3.  Pt will have R shoulder AROM within at least 10 degrees of contralateral upper limb without reproduction of shoulder pain as needed for reaching, self-care ADLs, and household chore performance Baseline: 10/24/22: Decreased PROM, no AROM testing due to current post-op timeline.  Goal status: INITIAL   4. Pt will increase strength for R shoulder to at least 4+/5 or greater grade in order to demonstrate improvement in strength and function as needed for lifting and carrying task Baseline: 10/24/22: Strength < 2 based on limited ROM (formal MMT deferred due to early post-op status).  Goal status: INITIAL     PLAN: PT FREQUENCY: 1-2x/week   PT DURATION: 12 weeks   PLANNED INTERVENTIONS: Therapeutic exercises, Therapeutic activity, Neuromuscular re-education,  Patient/Family education, Self Care, Joint mobilization, Joint manipulation, Dry Needling, Electrical stimulation, Cryotherapy, Moist heat, Manual therapy, and Re-evaluation.   PLAN FOR NEXT SESSION: A/AAROM of R shoulder and elbow, periscapular isometrics, manual therapy and modalities as needed for pain relief. Initiate light isometrics as able with successive visits.  Encounter Date: 11/30/2022 Janet Berlin PT DPT 2:07 PM,11/30/22   [facility] [address] [place], [place], [address] Phone: [phone]   Fax:  [phone]

## 2022-12-05 ENCOUNTER — Encounter: Payer: Medicaid Other | Admitting: Physical Therapy

## 2022-12-05 ENCOUNTER — Ambulatory Visit: Payer: Medicaid Other | Admitting: Physical Therapy

## 2022-12-05 DIAGNOSIS — M25511 Pain in right shoulder: Secondary | ICD-10-CM | POA: Diagnosis not present

## 2022-12-05 DIAGNOSIS — M6281 Muscle weakness (generalized): Secondary | ICD-10-CM

## 2022-12-05 DIAGNOSIS — M25611 Stiffness of right shoulder, not elsewhere classified: Secondary | ICD-10-CM

## 2022-12-05 NOTE — Therapy (Signed)
OUTPATIENT PHYSICAL THERAPY TREATMENT NOTE   Patient Name: Anita Mendoza MRN: [id number] DOB:1970/12/06, 52 y.o., female Today's Date: 12/05/2022  PCP:  No PCP on file  REFERRING PROVIDER: Gayleen Orem, MD   END OF SESSION:   PT End of Session - 12/05/22 1035     Visit Number 9    Number of Visits 25    Date for PT Re-Evaluation 01/16/23    Authorization Type [employer] 2024    Progress Note Due on Visit 10    PT Start Time 1036    PT Stop Time 1116    PT Time Calculation (min) 40 min    Activity Tolerance Patient tolerated treatment well    Behavior During Therapy WFL for tasks assessed/performed                 Past Medical History:  Diagnosis Date   Asthma 2014   Hypertension    Past Surgical History:  Procedure Laterality Date   ABDOMINAL HYSTERECTOMY     carpul tunnel Bilateral 2011   choleycystectomy N/A 2000   ROTATOR CUFF REPAIR Left 2012   TONSILLECTOMY N/A 1979   There are no problems to display for this patient.   REFERRING DIAG: M25.511 (ICD-10-CM) - Acute pain of right shoulder   THERAPY DIAG:  Acute pain of right shoulder  Stiffness of right shoulder, not elsewhere classified  Muscle weakness (generalized)  Rationale for Evaluation and Treatment Rehabilitation  PERTINENT HISTORY: Pt is a 52 year old female s/p Right shoulder diagnostic arthroscopy, debridement, rotator cuff repair upper border subscapularis, and open biceps tenodesis.  Patient reports notable post-operative pain - she reports having pain at night at this time. She reports using Gabapentin for pain control; she is avoiding use of oxycodone. Pt has cold compression machine to use at home. Patient reports notable pain/swelling along R upper trapezius region. Pt reports noticing bruising along top of arm or along distal anterior arm after getting rid of her abduction pillow. Pt reports history of numbness from head of radius down to her hand - this has not been  apparent following her surgery.    PAIN:  Pain Intensity: Present: 5/10, Best: 0/10, Worst: 10/10 Pain location: R UT, R paracervical region, R anterior shoulder and along ACJ region Pain Quality: aching  Radiating: No  Numbness/Tingling: No Aggravating factors: discomfort in her sling Relieving factors: sitting and at rest , ice  24-hour pain behavior: worse in PM  History of prior shoulder or neck/shoulder injury, pain, surgery, or therapy: Yes, hx of L shoulder arthroscopy 2011 Falls: Has patient fallen in last 6 months? No, Number of falls: N/A Dominant hand: right Imaging: No , no imaging since her surgery  Red flags (personal history of cancer, chills/fever, night sweats, nausea, vomiting, unrelenting pain): Negative   PRECAUTIONS: None   WEIGHT BEARING RESTRICTIONS: Yes NWB RUE   FALLS: Has patient fallen in last 6 months? No   Living Environment Lives with: lives with their spouse and 3 nephews  Lives in: House/apartment Stairs: No Has following equipment at home:  R shoulder sling   Prior level of function: Independent   Occupational demands: N/A   Hobbies: pt works in her church, Agricultural consultant work    Patient Goals: No pain at all; able to perform activities without pain     PRECAUTIONS: None   S/p right shoulder diagnostic arthroscopy, debridement, cyst removal along upper border subscapularis, and open biceps tenodesis (DOS: 10/13/22)    SUBJECTIVE:  SUBJECTIVE STATEMENT:  Pt reports her pain is around 3-4/10 recently. She reports doing well with her previous session. She reports that massage along R paracervical region last time did help with pain. Patient reports some discomfort in upper thoracic region last night when rolling over in bed and she had some LUE pain. She reports feeling  good today. Patient reports doing well with her HEP. Pt feels that TENS unit helped modestly. She reports mild pain affecting R upper trap and anterior/middle deltoid region today.    PAIN:  Are you having pain? 3-4/10 pain    OBJECTIVE: (objective measures completed at initial evaluation unless otherwise dated)     Patient Surveys  FOTO: 65, predicted outcome score of 60   Cognition Patient is oriented to person, place, and time.  Recent memory is intact.  Remote memory is intact.  Attention span and concentration are intact.  Expressive speech is intact.  Patient's fund of knowledge is within normal limits for educational level.                          Gross Musculoskeletal Assessment Tremor: None Bulk: Normal Tone: Normal     Posture Self-selected rounded shoulder posture, inc thoracic kyphosis; anteriorly tilted scapulae     PROM     PROM (Normal range in degrees) PROM 10/24/2022    Right Left  Shoulder      Flexion 120 deg    Extension      Abduction      External Rotation 30 deg    Internal Rotation 45 deg    Hands Behind Head      Hands Behind Back             Elbow      Flexion WNL    Extension WNL    Pronation WNL    Supination WNL    (* = pain; Blank rows = not tested)     MMT DEFERRED DUE TO POST-OP STATUS LE MMT: MMT (out of 5) Right 10/24/2022 Left 10/24/2022  Shoulder   Flexion      Extension      Abduction      External rotation      Internal rotation      Horizontal abduction      Horizontal adduction      Lower Trapezius      Rhomboids             Elbow  Flexion      Extension      Pronation      Supination             (* = pain; Blank rows = not tested)   Sensation Grossly intact to light touch bilateral UE as determined by testing dermatomes C2-T2. Proprioception and hot/cold testing deferred on this date.   Reflexes Deferred   Palpation Location LEFT  RIGHT           Subocciptials      Cervical paraspinals       Upper Trapezius   1  Levator Scapulae   1  Rhomboid Major/Minor      Sternoclavicular joint   0  Acromioclavicular joint   1  Coracoid process   2  Long head of biceps   2  Supraspinatus   2  Infraspinatus      Subscapularis      Teres Minor      Teres Major  Pectoralis Major      Pectoralis Minor      Anterior Deltoid   1  Lateral Deltoid   1  Posterior Deltoid   1  Latissimus Dorsi      Sternocleidomastoid      (Blank rows = not tested) Graded on 0-4 scale (0 = no pain, 1 = pain, 2 = pain with wincing/grimacing/flinching, 3 = pain with withdrawal, 4 = unwilling to allow palpation), (Blank rows = not tested)       SPECIAL TESTS Deferred       TODAY'S TREATMENT       Manual Therapy - for shoulder ROM, prevention of shoulder stiffness post-operatively   STM R upper trap, R anterior and middle deltoid; x 12 minutes R shoulder PROM into flexion, scaption, and ER/IR within pt tolerance; x 3 minutes   *next visit* Gentle GHJ distraction, low-grade for pain relief; x 3 minutes Glenohumeral joint mobilizations, gr I-II for pain control, A-P and inferior; 2 x 30 sec each  Gentle STM R middle/anterior deltoid and biceps x 3 minutes    Therapeutic Exercise - for R shoulder ROM, periscapular isometrics    Serratus punch with dowel, supine; 2x10  Wand flexion AAROM, in supine; 2x10  Standing, silver physioball rollout with bilat UE, ball on tabletop for forward flexion AAROM; x20  Pendulums with elbow supported; "rock the baby"; x20 CW and CCW  Isometric at wall; shoulder flexion and ABD; x10, 5 sec hold  -modified force into wall to ensure no significant rise in NPRS   PATIENT EDUCATION: We reviewed updated stretching program at home for relief of R paracervical/periscapular discomfort     *next visit* Hands-clasped shoulder flexion AAROM, R hand supinated; 2x10    *not today* Pulleys, seated; forward flexion AAROM; x20  MHP (unbilled) utilized  pre-treatment per protocol for analgesic effect and improved soft tissue extensibility/pliability; x 5 minutes .  Wand ER with dowel; 1x10 Table slide, seated; x10 with pillow case under hand to allow for less resistance Pendulums; x20 ea, fwd-bwd and circles clockwise/counterclockwise Upper trapezius stretch, R; 2x30 sec  Seated scapular retractions; 2x10, 3 sec hold  Elbow flexion AAROM, opposite UE assist for ROM; 1x10        PATIENT EDUCATION:  Education details: see above for patient education details Person educated: Patient and Spouse Education method: Explanation, Demonstration, and Handouts Education comprehension: verbalized understanding and returned demonstration     HOME EXERCISE PROGRAM:  Access Code: [email or web address] URL: [email or web address] Date: 11/03/2022 Prepared by: Consuela Mimes  Exercises - Flexion-Extension Shoulder Pendulum with Table Support  - 2 x daily - 7 x weekly - 2 sets - 10 reps - Seated Scapular Retraction  - 2 x daily - 7 x weekly - 2 sets - 10 reps - Seated Upper Trapezius Stretch  - 2 x daily - 7 x weekly - 3 sets - 30sec hold - Seated Elbow Flexion AAROM  - 1 x daily - 7 x weekly - 2 sets - 10 reps - Seated Shoulder Flexion Towel Slide at Table Top  - 1 x daily - 7 x weekly - 2 sets - 10 reps - 3sec hold    ASSESSMENT:   CLINICAL IMPRESSION: Patient does have higher pain than expected at this time post-operatively, and this may be associated with chronicity of symptoms prior to surgery and generalized chronic pain leading to central/peripheral sensitivity. She is improving with shoulder ROM as expected at this time following arthroscopy with biceps tenodesis and debridement.  Pt is no longer using sling, and sling was primary complaint over first 3 weeks for elbow and upper arm discomfort. Pt fortunately has responded well with STM focused on R UT/periscapular mm and this was utilized again today for relief. Pt does still have some  discomfort with active-assisted shoulder flexion. Isometrics are tolerated fairly well with carefully monitoring amount of force applied with isometrics. Pt has remaining post-operative impairments including: R shoulder pain, edema, localized inflammation, decreased ROM, postural changes, decreased R upper limb strength. Associated activity limitations in reaching, holding/carrying, lifting, self-care ADLs and household chores. Pt will benefit from continued skilled PT services to address deficits and improve function/QoL   OBJECTIVE IMPAIRMENTS: decreased ROM, decreased strength, hypomobility, increased edema, impaired UE functional use, postural dysfunction, and pain.    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    REHAB POTENTIAL: Excellent   CLINICAL DECISION MAKING: Evolving/moderate complexity   EVALUATION COMPLEXITY: Moderate     GOALS: Goals reviewed with patient? Yes   SHORT TERM GOALS: Target date: 11/09/2022   Pt will be independent with HEP to improve mobility and decrease pain to improve pain-free function at home Baseline: 10/24/22: Baseline HEP reviewed and demonstrated.  Goal status: INITIAL     LONG TERM GOALS: Target date: 01/18/2023   Pt will increase FOTO to at least 60 to demonstrate significant improvement in function at home and work related to neck pain  Baseline: 10/24/22: 42 Goal status: INITIAL   2.  Pt will decrease worst shoulder pain to no more than 2-3/10 on the NPRS in order to demonstrate clinically significant reduction in shoulder pain. Baseline: 10/24/22: 10/10 pain at worst  Goal status: INITIAL   3.  Pt will have R shoulder AROM within at least 10 degrees of  contralateral upper limb without reproduction of shoulder pain as needed for reaching, self-care ADLs, and household chore performance Baseline: 10/24/22: Decreased PROM, no AROM testing due to current post-op timeline.  Goal status: INITIAL   4. Pt will increase strength for R shoulder to at least 4+/5 or greater grade in order to demonstrate improvement in strength and function as needed for lifting and carrying task Baseline: 10/24/22: Strength < 2 based on limited ROM (formal MMT deferred due to early post-op status).  Goal status: INITIAL     PLAN: PT FREQUENCY: 1-2x/week   PT DURATION: 12 weeks   PLANNED INTERVENTIONS: Therapeutic exercises, Therapeutic activity, Neuromuscular re-education,  Patient/Family education, Self Care, Joint mobilization, Joint manipulation, Dry Needling, Electrical stimulation, Cryotherapy, Moist heat, Manual therapy, and Re-evaluation.   PLAN FOR NEXT SESSION: A/AAROM of R shoulder and elbow, periscapular isometrics, manual therapy and modalities as needed for pain relief. Initiate light isometrics as able with successive visits.     Consuela Mimes, PT, DPT #[id number]  Gertie Exon, PT 12/05/2022, 10:40 AM

## 2022-12-06 ENCOUNTER — Ambulatory Visit: Payer: Medicaid Other | Admitting: Physical Therapy

## 2022-12-07 ENCOUNTER — Encounter: Payer: Medicaid Other | Admitting: Physical Therapy

## 2022-12-12 ENCOUNTER — Encounter: Payer: Medicaid Other | Admitting: Physical Therapy

## 2022-12-14 ENCOUNTER — Encounter: Payer: Self-pay | Admitting: Physical Therapy

## 2022-12-14 ENCOUNTER — Encounter: Payer: Medicaid Other | Admitting: Physical Therapy

## 2022-12-19 ENCOUNTER — Encounter: Payer: Medicaid Other | Admitting: Physical Therapy

## 2022-12-21 ENCOUNTER — Ambulatory Visit: Payer: Medicaid Other | Attending: Orthopedic Surgery | Admitting: Physical Therapy

## 2022-12-21 DIAGNOSIS — M25611 Stiffness of right shoulder, not elsewhere classified: Secondary | ICD-10-CM | POA: Diagnosis present

## 2022-12-21 DIAGNOSIS — M6281 Muscle weakness (generalized): Secondary | ICD-10-CM | POA: Insufficient documentation

## 2022-12-21 DIAGNOSIS — M25511 Pain in right shoulder: Secondary | ICD-10-CM | POA: Diagnosis present

## 2022-12-21 NOTE — Therapy (Signed)
OUTPATIENT PHYSICAL THERAPY TREATMENT AND PROGRESS NOTE   Dates of reporting period  10/24/22   to   12/21/22    Patient Name: Anita Mendoza MRN: [id number] DOB:06/18/1971, 52 y.o., female Today's Date: 12/21/2022  PCP:  No PCP on file  REFERRING PROVIDER: Gayleen Orem, MD   END OF SESSION:   PT End of Session - 12/21/22 0909     Visit Number 10    Number of Visits 25    Date for PT Re-Evaluation 01/16/23    Authorization Type [employer] [id number]    Progress Note Due on Visit 10    PT Start Time 0910    PT Stop Time 0948    PT Time Calculation (min) 38 min    Activity Tolerance Patient tolerated treatment well    Behavior During Therapy [facility] for tasks assessed/performed             Past Medical History:  Diagnosis Date   Asthma 2014   Hypertension    Past Surgical History:  Procedure Laterality Date   ABDOMINAL HYSTERECTOMY     carpul tunnel Bilateral 2011   choleycystectomy N/A 2000   ROTATOR CUFF REPAIR Left 2012   TONSILLECTOMY N/A 1979   There are no problems to display for this patient.   REFERRING DIAG: M25.511 (ICD-10-CM) - Acute pain of right shoulder   THERAPY DIAG:  Acute pain of right shoulder  Stiffness of right shoulder, not elsewhere classified  Muscle weakness (generalized)  Rationale for Evaluation and Treatment Rehabilitation  PERTINENT HISTORY: Pt is a 52 year old female s/p Right shoulder diagnostic arthroscopy, debridement, cyst excision along upper border of subscapularis, and open biceps tenodesis.  Patient reports notable post-operative pain - she reports having pain at night at this time. She reports using Gabapentin for pain control; she is avoiding use of oxycodone. Pt has cold compression machine to use at home. Patient reports notable pain/swelling along R upper trapezius region. Pt reports noticing bruising along top of arm or along distal anterior arm after getting rid of her abduction pillow. Pt reports history of  numbness from head of radius down to her hand - this has not been apparent following her surgery.    PAIN:  Pain Intensity: Present: 5/10, Best: 0/10, Worst: 10/10 Pain location: R UT, R paracervical region, R anterior shoulder and along ACJ region Pain Quality: aching  Radiating: No  Numbness/Tingling: No Aggravating factors: discomfort in her sling Relieving factors: sitting and at rest , ice  24-hour pain behavior: worse in PM  History of prior shoulder or neck/shoulder injury, pain, surgery, or therapy: Yes, hx of L shoulder arthroscopy 2011 Falls: Has patient fallen in last 6 months? No, Number of falls: N/A Dominant hand: right Imaging: No , no imaging since her surgery  Red flags (personal history of cancer, chills/fever, night sweats, nausea, vomiting, unrelenting pain): Negative   PRECAUTIONS: None   WEIGHT BEARING RESTRICTIONS: Yes NWB RUE   FALLS: Has patient fallen in last 6 months? 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 She reports significant pain and difficulty sleeping the last few weeks. Patient reports 30% global rating of improvement to date. She reports significant activity limitations. Patient reports sensation of  tension/"ball" along R upper trapezius region. She reports pain along R paracervical region. Patient reports pain affecting posterior shoulder/posterior cuff region. Patient reports only having paresthesias with arm in dependent position. Patient reports difficulty with cleaning, getting groceries, feeding animals (sheep, goats, chickens, ducks, pigs).   PAIN:  Are you having pain? 8/10 pain    OBJECTIVE: (objective measures completed at initial evaluation unless otherwise dated)     Patient Surveys  FOTO: 74, predicted outcome score of 60   Cognition Patient is oriented to person, place, and time.  Recent memory is intact.  Remote memory is intact.  Attention span and concentration are intact.  Expressive speech is intact.  Patient's fund of knowledge is within normal limits for educational level.                          Gross Musculoskeletal Assessment Tremor: None Bulk: Normal Tone: Normal     Posture Self-selected rounded shoulder posture, inc thoracic kyphosis; anteriorly tilted scapulae     PROM       PROM (Normal range in degrees) PROM 10/24/2022 PROM 12/21/22    Right Left Right Left  Shoulder        Flexion 120 deg   153   Extension        Abduction     113   External Rotation 30 deg   83   Internal Rotation 45 deg   70   Hands Behind Head        Hands Behind Back                 Elbow        Flexion WNL   WNL   Extension WNL   WNL   Pronation WNL   WNL   Supination WNL   WNL   (* = pain; Blank rows = not tested)     Shoulder AROM Flexion: R 131, L 150 Abduction: R 106, L 167 ER: R 75, L 95     LE MMT: MMT (out of 5) Right 10/24/2022 Left 10/24/2022 Right 12/21/22 Left 12/21/22  Shoulder     Flexion     3+* 4  Extension        Abduction     3+* 4  External rotation     4+ 4+  Internal rotation     4- 4+  Horizontal abduction        Horizontal adduction        Lower Trapezius        Rhomboids                 Elbow    Flexion     4+ 4+   Extension     4- 4+  Pronation        Supination                 (* = pain; Blank rows = not tested)   Sensation Grossly intact to light touch bilateral UE as determined by testing dermatomes C2-T2. Proprioception and hot/cold testing deferred on this date.   Reflexes Deferred   Palpation Location LEFT  RIGHT           Subocciptials      Cervical paraspinals      Upper Trapezius   1  Levator Scapulae   1  Rhomboid Major/Minor      Sternoclavicular joint   0  Acromioclavicular joint   1  Coracoid process   2  Long head of biceps   2  Supraspinatus   2  Infraspinatus      Subscapularis      Teres Minor      Teres Major      Pectoralis Major      Pectoralis Minor      Anterior Deltoid   1  Lateral Deltoid   1  Posterior Deltoid   1  Latissimus Dorsi      Sternocleidomastoid      (Blank rows = not tested) Graded on 0-4 scale (0 = no pain, 1 = pain, 2 = pain with wincing/grimacing/flinching, 3 = pain with withdrawal, 4 = unwilling to allow palpation), (Blank rows = not tested)       SPECIAL TESTS Deferred       TODAY'S TREATMENT     Manual Therapy - for shoulder ROM, prevention of shoulder stiffness post-operatively   STM R upper trap, R anterior and middle deltoid; x 10 minutes R shoulder PROM into flexion, scaption, and ER/IR within pt tolerance; x 2 minutes   *next visit* Gentle GHJ distraction, low-grade for pain relief; x 3 minutes Glenohumeral joint mobilizations, gr I-II for pain control, A-P and inferior; 2 x 30 sec each  Gentle STM R middle/anterior deltoid and biceps x 3 minutes     Trigger Point Dry Needling (TDN), unbilled Education performed with patient regarding potential benefit of TDN. Reviewed precautions and risks with patient. Reviewed special precautions/risks over lung fields which include pneumothorax. Reviewed signs and symptoms of pneumothorax and advised pt to go to ER immediately if these symptoms develop advise them of dry  needling treatment. Extensive time spent with pt to ensure full understanding of TDN risks. Pt provided verbal consent to treatment. TDN performed to  R upper trapezius and R posterior deltoid with 0.25 x 40 single needle placements with local twitch response (LTR). Pistoning technique utilized. Improved pain-free motion following intervention.      Therapeutic Exercise - for R shoulder ROM, periscapular isometrics     *GOAL UPDATE PERFORMED  Upper body ergometer, 2 minutes forward, 2 minutes  backward - for tissue warm-up to improve muscle performance, improved soft tissue mobility/extensibility   -subjective information gathered during this time   Supine AROM shoulder flexion; 1 x 10   PATIENT EDUCATION: HEP update and review. We reviewed current progress with PT, prognosis, plan of care. We discussed f/u with surgeon regarding pain management given significant time removed from operation. We discussed post-treatment expectations after today and use of ice/heat at home prn. We reviewed positioning strategies to help with comfort during lying in bed.     *next visit* Isometric at wall; shoulder flexion and ABD; x10, 5 sec hold  -modified force into wall to ensure no significant rise in NPRS Wand flexion AAROM, in standing; 2x10   *not today* Serratus punch with dowel, supine; 2x10 Standing, silver physioball rollout with bilat UE, ball on tabletop for forward flexion AAROM; x20 Pendulums with elbow supported; "rock the baby"; x20 CW and CCW Hands-clasped shoulder flexion AAROM, R hand supinated; 2x10 Pulleys, seated; forward flexion AAROM; x20  Wand ER with dowel; 1x10 Table slide, seated; x10 with pillow case under hand to allow for less resistance     MHP (unbilled) used post-treatment for analgesic effect and soft tissue relaxation, in sitting with R arm propped; x 5 minutes .        PATIENT EDUCATION:  Education details: see above for patient education details Person  educated: Patient and Spouse Education method: Explanation, Demonstration, and Handouts Education comprehension: verbalized understanding and returned demonstration     HOME EXERCISE PROGRAM:  Access Code: [email or web address] URL: [email or web address] Date: 12/21/2022 Prepared by: Consuela Mimes  Exercises - Flexion-Extension Shoulder Pendulum with Table Support  - 2 x daily - 7 x weekly - 2 sets - 10 reps - Seated Upper Trapezius Stretch  - 2 x daily - 7 x weekly - 3 sets - 30sec hold - Supine Shoulder Flexion AAROM with Hands Clasped  - 2 x daily - 7 x weekly - 2 sets - 10 reps - Seated Shoulder Flexion Towel Slide at Table Top  - 1 x daily - 7 x weekly - 2 sets - 10 reps - 3sec hold - Supine Shoulder External Rotation with Dowel  - 1 x daily - 7 x weekly - 2 sets - 10 reps - Supine Shoulder Flexion Extension Full Range AROM  - 2 x daily - 7 x weekly - 1-2 sets - 10 reps    ASSESSMENT:   CLINICAL IMPRESSION: Patient unfortunately has struggled with pain management to date. She benefited from [facility] utilized with Janet Berlin, PT. This was continued last visit. Pt was not in PT over last 2 weeks due to scheduling challenges. Pt reports high-level pain and difficulty sleeping. 8/10 pain today with 10/10 NPRS at worst. Given expected post-operative healing timeline, pain is higher than expected given that her surgery was performed on 10/13/22 (9 weeks post-op). We utilized dry needling today to treat affected soft tissue given significant treatment effect utilized from prior STM. Pt has made notable progress with ROM and MMTs. Unfortunately functional progress and pain have been largely limited. F/u with surgeon was recommended to discuss possible medical management options for ongoing pain. Pt has improved FOTO moderately, but has not yet met long-term goal.  Pt has remaining post-operative impairments including: R shoulder pain, edema, localized inflammation, decreased ROM, postural changes,  decreased R upper limb strength. Associated activity limitations in reaching, holding/carrying, lifting, self-care ADLs and household chores. Pt will benefit from continued skilled PT services to address deficits  and improve function/QoL   OBJECTIVE IMPAIRMENTS: decreased ROM, decreased strength, hypomobility, increased edema, impaired UE functional use, postural dysfunction, and pain.    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    REHAB POTENTIAL: Excellent   CLINICAL DECISION MAKING: Evolving/moderate complexity   EVALUATION COMPLEXITY: Moderate     GOALS: Goals reviewed with patient? Yes   SHORT TERM GOALS: Target date: 11/09/2022   Pt will be independent with HEP to improve mobility and decrease pain to improve pain-free function at home Baseline: 10/24/22: Baseline HEP reviewed and demonstrated.   12/21/22: Pt is compliant with HEP  Goal status: ACHIEVED     LONG TERM GOALS: Target date: 01/18/2023   Pt will increase FOTO to at least 60 to demonstrate significant improvement in function at home and work related to neck pain  Baseline: 10/24/22: 42.    12/21/22: 47/60 Goal status: IN PROGRESS   2.  Pt will decrease worst shoulder pain to no more than 2-3/10 on the NPRS in order to demonstrate clinically significant reduction in shoulder pain. Baseline: 10/24/22: 10/10 pain at worst.     12/21/22: 10/10 at worst.  Goal status: NOT MET   3.  Pt will have R shoulder AROM within at least 10 degrees of contralateral upper limb without reproduction of shoulder pain as needed for reaching, self-care ADLs, and household chore performance Baseline: 10/24/22: Decreased PROM, no AROM testing due to current  post-op timeline.    12/21/22: All motions improved, not within 10 deg of contralateral upper limb Goal status: IN PROGRESS/ON-GOING   4. Pt will increase strength for R shoulder to at least 4+/5 or greater grade in order to demonstrate improvement in strength and function as needed for lifting and carrying task Baseline: 10/24/22: Strength < 2 based on limited ROM (formal MMT deferred due to early post-op status).    12/21/22: Met for shoulder ER and elbow flexion; otherwise, remaining weakness less than 4+/5 (see chart above).   Goal status: ON-GOING     PLAN: PT FREQUENCY: 1-2x/week   PT DURATION: 6-8 weeks   PLANNED INTERVENTIONS: Therapeutic exercises, Therapeutic activity, Neuromuscular re-education,  Patient/Family education, Self Care, Joint mobilization, Joint manipulation, Dry Needling, Electrical stimulation, Cryotherapy, Moist heat, Manual therapy, and Re-evaluation.   PLAN FOR NEXT SESSION: Progressing to full AROM of shoulder as tolerated, progressive long-lever flexion; periscapular isotonics. STM and dry needling for TrP along R upper trapezius and R posterior deltoid/cuff prn. Continue with isometrics for RTC and deltoid; progress with isotonics/PRE as able.     Consuela Mimes, PT, DPT #[id number]  Gertie Exon, PT 12/21/2022, 9:44 AM

## 2022-12-27 NOTE — Therapy (Deleted)
OUTPATIENT PHYSICAL THERAPY TREATMENT    Patient Name: Anita Mendoza MRN: [id number] DOB:11/07/1970, 52 y.o., female Today's Date: 12/27/2022  PCP:  No PCP on file  REFERRING PROVIDER: Gayleen Orem, MD   END OF SESSION:     Past Medical History:  Diagnosis Date   Asthma 2014   Hypertension    Past Surgical History:  Procedure Laterality Date   ABDOMINAL HYSTERECTOMY     carpul tunnel Bilateral 2011   choleycystectomy N/A 2000   ROTATOR CUFF REPAIR Left 2012   TONSILLECTOMY N/A 1979   There are no problems to display for this patient.   REFERRING DIAG: M25.511 (ICD-10-CM) - Acute pain of right shoulder   THERAPY DIAG:  Acute pain of right shoulder  Stiffness of right shoulder, not elsewhere classified  Muscle weakness (generalized)  Rationale for Evaluation and Treatment Rehabilitation  PERTINENT HISTORY: Pt is a 52 year old female s/p Right shoulder diagnostic arthroscopy, debridement, cyst excision along upper border of subscapularis, and open biceps tenodesis.  Patient reports notable post-operative pain - she reports having pain at night at this time. She reports using Gabapentin for pain control; she is avoiding use of oxycodone. Pt has cold compression machine to use at home. Patient reports notable pain/swelling along R upper trapezius region. Pt reports noticing bruising along top of arm or along distal anterior arm after getting rid of her abduction pillow. Pt reports history of numbness from head of radius down to her hand - this has not been apparent following her surgery.    PAIN:  Pain Intensity: Present: 5/10, Best: 0/10, Worst: 10/10 Pain location: R UT, R paracervical region, R anterior shoulder and along ACJ region Pain Quality: aching  Radiating: No  Numbness/Tingling: No Aggravating factors: discomfort in her sling Relieving factors: sitting and at rest , ice  24-hour pain behavior: worse in PM  History of prior shoulder or neck/shoulder  injury, pain, surgery, or therapy: Yes, hx of L shoulder arthroscopy 2011 Falls: Has patient fallen in last 6 months? No, Number of falls: N/A Dominant hand: right Imaging: No , no imaging since her surgery  Red flags (personal history of cancer, chills/fever, night sweats, nausea, vomiting, unrelenting pain): Negative   PRECAUTIONS: None   WEIGHT BEARING RESTRICTIONS: Yes NWB RUE   FALLS: Has patient fallen in last 6 months? 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 She reports significant pain and difficulty sleeping the last few weeks. Patient reports 30% global rating of improvement to date. She reports significant activity limitations. Patient reports sensation of tension/"ball" along R upper trapezius region. She reports pain along R paracervical region. Patient reports pain affecting posterior shoulder/posterior cuff region. Patient reports only having paresthesias with arm in dependent position. Patient reports difficulty with cleaning, getting groceries, feeding animals (sheep, goats, chickens, ducks, pigs).   PAIN:  Are you having pain? 8/10 pain    OBJECTIVE: (objective measures completed at initial evaluation unless otherwise  dated)     Patient Surveys  FOTO: 74, predicted outcome score of 60   Cognition Patient is oriented to person, place, and time.  Recent memory is intact.  Remote memory is intact.  Attention span and concentration are intact.  Expressive speech is intact.  Patient's fund of knowledge is within normal limits for educational level.                          Gross Musculoskeletal Assessment Tremor: None Bulk: Normal Tone: Normal     Posture Self-selected rounded shoulder posture, inc thoracic kyphosis; anteriorly tilted scapulae     PROM       PROM (Normal range in degrees) PROM 10/24/2022 PROM 12/21/22    Right Left Right Left  Shoulder        Flexion 120 deg   153   Extension        Abduction     113   External Rotation 30 deg   83   Internal Rotation 45 deg   70   Hands Behind Head        Hands Behind Back                 Elbow        Flexion WNL   WNL   Extension WNL   WNL   Pronation WNL   WNL   Supination WNL   WNL   (* = pain; Blank rows = not tested)     Shoulder AROM Flexion: R 131, L 150 Abduction: R 106, L 167 ER: R 75, L 95     LE MMT: MMT (out of 5) Right 10/24/2022 Left 10/24/2022 Right 12/21/22 Left 12/21/22  Shoulder     Flexion     3+* 4  Extension        Abduction     3+* 4  External rotation     4+ 4+  Internal rotation     4- 4+  Horizontal abduction        Horizontal adduction        Lower Trapezius        Rhomboids                 Elbow    Flexion     4+ 4+  Extension     4- 4+  Pronation        Supination                 (* = pain; Blank rows = not tested)   Sensation Grossly intact to light touch bilateral UE as determined by testing dermatomes C2-T2. Proprioception and hot/cold testing deferred on this date.   Reflexes Deferred   Palpation Location LEFT  RIGHT           Subocciptials  Cervical paraspinals      Upper Trapezius   1  Levator Scapulae   1  Rhomboid Major/Minor      Sternoclavicular joint   0   Acromioclavicular joint   1  Coracoid process   2  Long head of biceps   2  Supraspinatus   2  Infraspinatus      Subscapularis      Teres Minor      Teres Major      Pectoralis Major      Pectoralis Minor      Anterior Deltoid   1  Lateral Deltoid   1  Posterior Deltoid   1  Latissimus Dorsi      Sternocleidomastoid      (Blank rows = not tested) Graded on 0-4 scale (0 = no pain, 1 = pain, 2 = pain with wincing/grimacing/flinching, 3 = pain with withdrawal, 4 = unwilling to allow palpation), (Blank rows = not tested)       SPECIAL TESTS Deferred       TODAY'S TREATMENT     Manual Therapy - for shoulder ROM, prevention of shoulder stiffness post-operatively   STM R upper trap, R anterior and middle deltoid; x 10 minutes R shoulder PROM into flexion, scaption, and ER/IR within pt tolerance; x 2 minutes   *next visit* Gentle GHJ distraction, low-grade for pain relief; x 3 minutes Glenohumeral joint mobilizations, gr I-II for pain control, A-P and inferior; 2 x 30 sec each  Gentle STM R middle/anterior deltoid and biceps x 3 minutes     Trigger Point Dry Needling (TDN), unbilled Education performed with patient regarding potential benefit of TDN. Reviewed precautions and risks with patient. Reviewed special precautions/risks over lung fields which include pneumothorax. Reviewed signs and symptoms of pneumothorax and advised pt to go to ER immediately if these symptoms develop advise them of dry needling treatment. Extensive time spent with pt to ensure full understanding of TDN risks. Pt provided verbal consent to treatment. TDN performed to  R upper trapezius and R posterior deltoid with 0.25 x 40 single needle placements with local twitch response (LTR). Pistoning technique utilized. Improved pain-free motion following intervention.      Therapeutic Exercise - for R shoulder ROM, periscapular isometrics     Upper body ergometer, 2 minutes forward, 2 minutes  backward - for tissue warm-up to improve muscle performance, improved soft tissue mobility/extensibility   -subjective information gathered during this time   Supine AROM shoulder flexion; 1 x 10  Isometric at wall; shoulder flexion and ABD; x10, 5 sec hold  -modified force into wall to ensure no significant rise in NPRS Wand flexion AAROM, in standing; 2x10   PATIENT EDUCATION: HEP update and review. We reviewed current progress with PT, prognosis, plan of care. We discussed f/u with surgeon regarding pain management given significant time removed from operation. We discussed post-treatment expectations after today and use of ice/heat at home prn. We reviewed positioning strategies to help with comfort during lying in bed.      *not today* Serratus punch with dowel, supine; 2x10 Standing, silver physioball rollout with bilat UE, ball on tabletop for forward flexion AAROM; x20 Pendulums with elbow supported; "rock the baby"; x20 CW and CCW Hands-clasped shoulder flexion AAROM, R hand supinated; 2x10 Pulleys, seated; forward flexion AAROM; x20  Wand ER with dowel; 1x10 Table slide, seated; x10 with pillow case under hand to allow for less resistance     MHP (unbilled) used post-treatment for  analgesic effect and soft tissue relaxation, in sitting with R arm propped; x 5 minutes .        PATIENT EDUCATION:  Education details: see above for patient education details Person educated: Patient and Spouse Education method: Explanation, Demonstration, and Handouts Education comprehension: verbalized understanding and returned demonstration     HOME EXERCISE PROGRAM:  Access Code: [email or web address] URL: [email or web address] Date: 12/21/2022 Prepared by: Consuela Mimes  Exercises - Flexion-Extension Shoulder Pendulum with Table Support  - 2 x daily - 7 x weekly - 2 sets - 10 reps - Seated Upper Trapezius Stretch  - 2 x daily - 7 x weekly - 3 sets - 30sec hold - Supine  Shoulder Flexion AAROM with Hands Clasped  - 2 x daily - 7 x weekly - 2 sets - 10 reps - Seated Shoulder Flexion Towel Slide at Table Top  - 1 x daily - 7 x weekly - 2 sets - 10 reps - 3sec hold - Supine Shoulder External Rotation with Dowel  - 1 x daily - 7 x weekly - 2 sets - 10 reps - Supine Shoulder Flexion Extension Full Range AROM  - 2 x daily - 7 x weekly - 1-2 sets - 10 reps    ASSESSMENT:   CLINICAL IMPRESSION: Patient unfortunately has struggled with pain management to date. She benefited from [facility] utilized with Janet Berlin, PT. This was continued last visit. Pt was not in PT over last 2 weeks due to scheduling challenges. Pt reports high-level pain and difficulty sleeping. 8/10 pain today with 10/10 NPRS at worst. Given expected post-operative healing timeline, pain is higher than expected given that her surgery was performed on 10/13/22 (9 weeks post-op). We utilized dry needling today to treat affected soft tissue given significant treatment effect utilized from prior STM. Pt has made notable progress with ROM and MMTs. Unfortunately functional progress and pain have been largely limited. F/u with surgeon was recommended to discuss possible medical management options for ongoing pain. Pt has improved FOTO moderately, but has not yet met long-term goal.  Pt has remaining post-operative impairments including: R shoulder pain, edema, localized inflammation, decreased ROM, postural changes, decreased R upper limb strength. Associated activity limitations in reaching, holding/carrying, lifting, self-care ADLs and household chores. Pt will benefit from continued skilled PT services to address deficits and improve function/QoL   OBJECTIVE IMPAIRMENTS: decreased ROM, decreased strength, hypomobility, increased edema, impaired UE functional use, postural dysfunction, and pain.    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    REHAB POTENTIAL: Excellent   CLINICAL DECISION MAKING: Evolving/moderate complexity   EVALUATION COMPLEXITY: Moderate     GOALS: Goals reviewed with patient? Yes   SHORT TERM GOALS: Target date: 11/09/2022   Pt will be independent with HEP to improve mobility and decrease pain to improve pain-free function at home Baseline: 10/24/22: Baseline HEP reviewed and demonstrated.   12/21/22: Pt is compliant with HEP  Goal status: ACHIEVED     LONG TERM GOALS: Target date: 01/18/2023   Pt will increase FOTO to at least 60 to demonstrate significant improvement in function at home and work related to neck pain  Baseline: 10/24/22: 42.    12/21/22: 47/60 Goal status: IN PROGRESS   2.  Pt will decrease worst shoulder pain  to no more than 2-3/10 on the NPRS in order to demonstrate clinically significant reduction in shoulder pain. Baseline: 10/24/22: 10/10 pain at worst.     12/21/22: 10/10 at worst.  Goal status: NOT MET   3.  Pt will have R shoulder AROM within at least 10 degrees of contralateral upper limb without reproduction of shoulder pain as needed for reaching, self-care ADLs, and household chore performance Baseline: 10/24/22: Decreased PROM, no AROM testing due to current post-op timeline.    12/21/22: All motions improved, not within 10 deg of contralateral upper limb Goal status: IN PROGRESS/ON-GOING   4. Pt will increase strength for R shoulder to at least 4+/5 or greater grade in order to demonstrate improvement in strength and function as needed for lifting and carrying task Baseline: 10/24/22: Strength < 2 based on limited ROM (formal MMT deferred due to early post-op status).    12/21/22: Met for shoulder ER and elbow flexion; otherwise, remaining weakness less than 4+/5 (see chart  above).   Goal status: ON-GOING     PLAN: PT FREQUENCY: 1-2x/week   PT DURATION: 6-8 weeks   PLANNED INTERVENTIONS: Therapeutic exercises, Therapeutic activity, Neuromuscular re-education,  Patient/Family education, Self Care, Joint mobilization, Joint manipulation, Dry Needling, Electrical stimulation, Cryotherapy, Moist heat, Manual therapy, and Re-evaluation.   PLAN FOR NEXT SESSION: Progressing to full AROM of shoulder as tolerated, progressive long-lever flexion; periscapular isotonics. STM and dry needling for TrP along R upper trapezius and R posterior deltoid/cuff prn. Continue with isometrics for RTC and deltoid; progress with isotonics/PRE as able.     Consuela Mimes, PT, DPT #[id number]  Gertie Exon, PT 12/27/2022, 6:19 PM

## 2022-12-28 ENCOUNTER — Ambulatory Visit: Payer: Medicaid Other | Admitting: Physical Therapy

## 2022-12-28 DIAGNOSIS — M6281 Muscle weakness (generalized): Secondary | ICD-10-CM

## 2022-12-28 DIAGNOSIS — M25611 Stiffness of right shoulder, not elsewhere classified: Secondary | ICD-10-CM

## 2022-12-28 DIAGNOSIS — M25511 Pain in right shoulder: Secondary | ICD-10-CM

## 2023-01-02 ENCOUNTER — Ambulatory Visit: Payer: Medicaid Other | Admitting: Physical Therapy

## 2023-01-04 ENCOUNTER — Ambulatory Visit: Payer: Medicaid Other | Admitting: Physical Therapy

## 2023-01-11 ENCOUNTER — Encounter: Payer: Self-pay | Admitting: Physical Therapy

## 2023-01-11 ENCOUNTER — Ambulatory Visit: Payer: Medicaid Other

## 2023-01-11 DIAGNOSIS — M25511 Pain in right shoulder: Secondary | ICD-10-CM

## 2023-01-11 DIAGNOSIS — M25611 Stiffness of right shoulder, not elsewhere classified: Secondary | ICD-10-CM

## 2023-01-11 DIAGNOSIS — M6281 Muscle weakness (generalized): Secondary | ICD-10-CM

## 2023-01-11 NOTE — Therapy (Signed)
OUTPATIENT PHYSICAL THERAPY TREATMENT    Patient Name: Anita Mendoza MRN: [id number] DOB:March 26, 1971, 52 y.o., female Today's Date: 01/11/2023  PCP:  No PCP on file  REFERRING PROVIDER: Gayleen Orem, MD   END OF SESSION:   PT End of Session - 01/11/23 0831     Visit Number 11    Number of Visits 25    Date for PT Re-Evaluation 01/16/23    Authorization Type [employer] 2024    Progress Note Due on Visit 10    PT Start Time 0830    PT Stop Time 0900    PT Time Calculation (min) 30 min    Activity Tolerance Patient tolerated treatment well    Behavior During Therapy [facility] for tasks assessed/performed             Past Medical History:  Diagnosis Date   Asthma 2014   Hypertension    Past Surgical History:  Procedure Laterality Date   ABDOMINAL HYSTERECTOMY     carpul tunnel Bilateral 2011   choleycystectomy N/A 2000   ROTATOR CUFF REPAIR Left 2012   TONSILLECTOMY N/A 1979   There are no problems to display for this patient.   REFERRING DIAG: M25.511 (ICD-10-CM) - Acute pain of right shoulder   THERAPY DIAG:  Acute pain of right shoulder  Stiffness of right shoulder, not elsewhere classified  Muscle weakness (generalized)  Rationale for Evaluation and Treatment Rehabilitation  PERTINENT HISTORY: Pt is a 52 year old female s/p Right shoulder diagnostic arthroscopy, debridement, cyst excision along upper border of subscapularis, and open biceps tenodesis.  Patient reports notable post-operative pain - she reports having pain at night at this time. She reports using Gabapentin for pain control; she is avoiding use of oxycodone. Pt has cold compression machine to use at home. Patient reports notable pain/swelling along R upper trapezius region. Pt reports noticing bruising along top of arm or along distal anterior arm after getting rid of her abduction pillow. Pt reports history of numbness from head of radius down to her hand - this has not been  apparent following her surgery.    PAIN:  Pain Intensity: Present: 5/10, Best: 0/10, Worst: 10/10 Pain location: R UT, R paracervical region, R anterior shoulder and along ACJ region Pain Quality: aching  Radiating: No  Numbness/Tingling: No Aggravating factors: discomfort in her sling Relieving factors: sitting and at rest , ice  24-hour pain behavior: worse in PM  History of prior shoulder or neck/shoulder injury, pain, surgery, or therapy: Yes, hx of L shoulder arthroscopy 2011 Falls: Has patient fallen in last 6 months? No, Number of falls: N/A Dominant hand: right Imaging: No , no imaging since her surgery  Red flags (personal history of cancer, chills/fever, night sweats, nausea, vomiting, unrelenting pain): Negative   PRECAUTIONS: None   WEIGHT BEARING RESTRICTIONS: Yes NWB RUE   FALLS: Has patient fallen in last 6 months? 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 Pain is improved to 6/10 more in the upper cervical region and R upper trap which she attributes to the steroid she was on from her sickness. Reports steroid was complete Monday and still has improved pain relief since then. Been compliant with Hep 2x/day. She reports sleeping  better.    PAIN:  Are you having pain? 6/10 pain    OBJECTIVE: (objective measures completed at initial evaluation unless otherwise dated)     Patient Surveys  FOTO: 41, predicted outcome score of 60   Cognition Patient is oriented to person, place, and time.  Recent memory is intact.  Remote memory is intact.  Attention span and concentration are intact.  Expressive speech is intact.  Patient's fund of knowledge is within normal limits for educational level.                          Gross Musculoskeletal Assessment Tremor: None Bulk: Normal Tone: Normal     Posture Self-selected rounded shoulder posture, inc thoracic kyphosis; anteriorly tilted scapulae     PROM       PROM (Normal range in degrees) PROM 10/24/2022 PROM 12/21/22    Right Left Right Left  Shoulder        Flexion 120 deg   153   Extension        Abduction     113   External Rotation 30 deg   83   Internal Rotation 45 deg   70   Hands Behind Head        Hands Behind Back                 Elbow        Flexion WNL   WNL   Extension WNL   WNL   Pronation WNL   WNL   Supination WNL   WNL   (* = pain; Blank rows = not tested)     Shoulder AROM Flexion: R 131, L 150 Abduction: R 106, L 167 ER: R 75, L 95     LE MMT: MMT (out of 5) Right 10/24/2022 Left 10/24/2022 Right 12/21/22 Left 12/21/22  Shoulder     Flexion     3+* 4  Extension        Abduction     3+* 4  External rotation     4+ 4+  Internal rotation     4- 4+  Horizontal abduction        Horizontal adduction        Lower Trapezius        Rhomboids                 Elbow    Flexion     4+ 4+  Extension     4- 4+  Pronation        Supination                 (* = pain; Blank rows = not tested)   Sensation Grossly intact to light touch bilateral UE as determined by testing dermatomes C2-T2. Proprioception and hot/cold testing deferred on this date.   Reflexes Deferred   Palpation Location LEFT  RIGHT            Subocciptials      Cervical paraspinals      Upper Trapezius   1  Levator Scapulae   1  Rhomboid Major/Minor  Sternoclavicular joint   0  Acromioclavicular joint   1  Coracoid process   2  Long head of biceps   2  Supraspinatus   2  Infraspinatus      Subscapularis      Teres Minor      Teres Major      Pectoralis Major      Pectoralis Minor      Anterior Deltoid   1  Lateral Deltoid   1  Posterior Deltoid   1  Latissimus Dorsi      Sternocleidomastoid      (Blank rows = not tested) Graded on 0-4 scale (0 = no pain, 1 = pain, 2 = pain with wincing/grimacing/flinching, 3 = pain with withdrawal, 4 = unwilling to allow palpation), (Blank rows = not tested)       SPECIAL TESTS Deferred       TODAY'S TREATMENT  Therapeutic Exercise - for R shoulder ROM, periscapular isometrics     R shoulder AROM (standing) in all planes standing to reassess mobility as pt has been absent ~3 weeks from PT:   R side remains limited in flexion and abduction compared to L side. Abduction > flexion.  Relies on postural adjustments to elevate overhead in both planes. Decreased AROM appreciated after PT cues to remain upright.   X12 flexion RUE   X12 abduction RUE   Standing AAROM wand flexion overhead for re-training scapulohumeral rhythm and scapular upward rotation: 2x12   L side lying R shoulder ER: 3x8  R shoulder CW/CCW rotations on rainbow ball on wall  at 90 deg flexion: 2x30 sec /direction  R shoulder serratus punches: 3x12, 2# DB. Mod multimodal cuing to maintain elbow extension with fair carryover.   Ice provided for 3 minutes. Unbilled. Education on maintain current HEP. Educated pt to update primary PT next week on pain response to today's session as steroid taper is complete. Educated if pt has positive response, primary PT will update her HEP as pt is still 13 weeks post op officially on phase 3 of protocol.   PATIENT EDUCATION: Education on maintain current HEP. Educated  pt to update primary PT next week on pain response to today's session as steroid taper is complete. Educated if pt has positive response, primary PT will update her HEP as pt is still 13 weeks post op officially on phase 3 of protocol.      PATIENT EDUCATION:  Education details: see above for patient education details Person educated: Patient and Spouse Education method: Explanation, Demonstration, and Handouts Education comprehension: verbalized understanding and returned demonstration     HOME EXERCISE PROGRAM:  Access Code: [email or web address] URL: [email or web address] Date: 12/21/2022 Prepared by: Consuela Mimes  Exercises - Flexion-Extension Shoulder Pendulum with Table Support  - 2 x daily - 7 x weekly - 2 sets - 10 reps - Seated Upper Trapezius Stretch  - 2 x daily - 7 x weekly - 3 sets - 30sec hold - Supine Shoulder Flexion AAROM with Hands Clasped  - 2 x daily - 7 x weekly - 2 sets - 10 reps - Seated Shoulder Flexion Towel Slide at Table Top  - 1 x daily - 7 x weekly - 2 sets - 10 reps - 3sec hold - Supine Shoulder External Rotation with Dowel  - 1 x daily - 7 x weekly - 2 sets - 10 reps - Supine Shoulder Flexion Extension Full Range AROM  - 2 x daily - 7 x weekly - 1-2 sets -  10 reps    ASSESSMENT:   CLINICAL IMPRESSION: Pt returning to PT after ~3 week hiatus from sickness. Pt's session overall limited today due to being late. Pt is 13 weeks post op and reports improved pain from oral steroid from acute illness. Pt tolerating progression of exercise today with AROM with gentle use of gravity and small DB weights for strengthening. Pt approaching end of POC and will require updated HEP and goal re-assessment next week if pt's pain remains better controlled.    OBJECTIVE IMPAIRMENTS: decreased ROM, decreased strength, hypomobility, increased edema, impaired UE functional use, postural dysfunction, and pain.    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    REHAB POTENTIAL: Excellent   CLINICAL DECISION MAKING: Evolving/moderate complexity   EVALUATION COMPLEXITY: Moderate     GOALS: Goals reviewed with patient? Yes   SHORT TERM GOALS: Target date: 11/09/2022   Pt will be independent with HEP to improve mobility and decrease pain to improve pain-free function at home Baseline: 10/24/22: Baseline HEP reviewed and demonstrated.   12/21/22: Pt is compliant with HEP  Goal status: ACHIEVED     LONG TERM GOALS: Target date: 01/18/2023   Pt will increase FOTO to at least 60 to demonstrate significant improvement in function at home and work related to neck pain  Baseline: 10/24/22: 42.    12/21/22: 47/60 Goal status: IN PROGRESS   2.  Pt will decrease worst shoulder pain to no more than 2-3/10 on the NPRS in order to demonstrate clinically significant reduction in shoulder pain. Baseline: 10/24/22: 10/10 pain at worst.     12/21/22: 10/10 at worst.  Goal status: NOT MET   3.  Pt will have R shoulder AROM within at least 10 degrees of contralateral upper limb without reproduction of shoulder pain as needed for reaching, self-care ADLs, and household chore performance Baseline: 10/24/22: Decreased PROM, no AROM testing due to current post-op timeline.    12/21/22: All motions improved, not within 10 deg of contralateral upper limb Goal status: IN PROGRESS/ON-GOING   4. Pt will increase strength for R shoulder to at least 4+/5 or greater grade in order to demonstrate improvement in strength and function as needed for lifting and carrying task Baseline: 10/24/22: Strength < 2 based on limited ROM (formal MMT deferred due to early post-op status).    12/21/22: Met for shoulder ER and  elbow flexion; otherwise, remaining weakness less than 4+/5 (see chart above).   Goal status: ON-GOING     PLAN: PT FREQUENCY: 1-2x/week   PT DURATION: 6-8 weeks   PLANNED INTERVENTIONS: Therapeutic exercises, Therapeutic activity, Neuromuscular re-education,  Patient/Family education, Self Care, Joint mobilization, Joint manipulation, Dry Needling, Electrical stimulation, Cryotherapy, Moist heat, Manual therapy, and Re-evaluation.   PLAN FOR NEXT SESSION: Progressing to full AROM of shoulder as tolerated, progressive long-lever flexion; periscapular isotonics. STM and dry needling for TrP along R upper trapezius and R posterior deltoid/cuff prn. Continue with isometrics for RTC and deltoid; progress with isotonics/PRE as able.     Delphia Grates. Fairly IV, PT, DPT Physical Therapist- [place]  [facility]  01/11/2023, 9:17 AM

## 2023-01-16 ENCOUNTER — Ambulatory Visit: Payer: Medicaid Other | Attending: Orthopedic Surgery

## 2023-01-16 DIAGNOSIS — M6281 Muscle weakness (generalized): Secondary | ICD-10-CM | POA: Insufficient documentation

## 2023-01-16 DIAGNOSIS — M25511 Pain in right shoulder: Secondary | ICD-10-CM | POA: Insufficient documentation

## 2023-01-16 DIAGNOSIS — M25611 Stiffness of right shoulder, not elsewhere classified: Secondary | ICD-10-CM | POA: Insufficient documentation

## 2023-01-16 NOTE — Therapy (Signed)
OUTPATIENT PHYSICAL THERAPY TREATMENT    Patient Name: Margaretha Canlas MRN: [id number] DOB:Aug 11, 1971, 52 y.o., female Today's Date: 01/16/2023  PCP:  No PCP on file  REFERRING PROVIDER: Gayleen Orem, MD   END OF SESSION:   PT End of Session - 01/16/23 0824     Visit Number 12    Number of Visits 25    Date for PT Re-Evaluation 03/01/23    Authorization Type [employer] 2024    Authorization Time Period 12/21/22-03/01/23    Progress Note Due on Visit 20    PT Start Time 0815    PT Stop Time 0855    PT Time Calculation (min) 40 min    Activity Tolerance Patient tolerated treatment well    Behavior During Therapy [facility] for tasks assessed/performed             Past Medical History:  Diagnosis Date   Asthma 2014   Hypertension    Past Surgical History:  Procedure Laterality Date   ABDOMINAL HYSTERECTOMY     carpul tunnel Bilateral 2011   choleycystectomy N/A 2000   ROTATOR CUFF REPAIR Left 2012   TONSILLECTOMY N/A 1979   There are no problems to display for this patient.   REFERRING DIAG: M25.511 (ICD-10-CM) - Acute pain of right shoulder   THERAPY DIAG:  Acute pain of right shoulder  Stiffness of right shoulder, not elsewhere classified  Muscle weakness (generalized)  Rationale for Evaluation and Treatment Rehabilitation  PERTINENT HISTORY: Pt is a 52 year old female s/p Right shoulder diagnostic arthroscopy, debridement, cyst excision along upper border of subscapularis, and open biceps tenodesis.  Patient reports notable post-operative pain - she reports having pain at night at this time. She reports using Gabapentin for pain control; she is avoiding use of oxycodone. Pt has cold compression machine to use at home. Patient reports notable pain/swelling along R upper trapezius region. Pt reports noticing bruising along top of arm or along distal anterior arm after getting rid of her abduction pillow. Pt reports history of numbness from head of radius  down to her hand - this has not been apparent following her surgery.    PAIN: 7/10 Rt upper trap area     PRECAUTIONS: None   WEIGHT BEARING RESTRICTIONS: Yes NWB RUE   FALLS: Has patient fallen in last 6 months? 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   PAIN:  Are you having pain? 7/10 pain    OBJECTIVE:  TODAY'S TREATMENT 01/16/23  Therapeutic Exercise   -AA/ROM on UBE x 4 minutes, 2 fwd, 2 back  - seated flexion table slides 1x15x5secH  - seated scaption table slides 1x15x5secH - shoulder ABDCT P/ROM walkaway with TRX strap 15x5secH  - L side lying R shoulder ER: 1x10    PATIENT EDUCATION:  Education details: see above for patient education details Person educated: Patient and Spouse Education method: Explanation, Demonstration, and Handouts Education comprehension: verbalized understanding and returned demonstration     HOME EXERCISE PROGRAM:  Access Code: [email or web address] URL: [email or web address] Date: 12/21/2022 Prepared by: Consuela Mimes  Exercises - Flexion-Extension Shoulder Pendulum with Table Support  - 2 x daily - 7 x weekly - 2 sets - 10 reps - Seated Upper Trapezius  Stretch  - 2 x daily - 7 x weekly - 3 sets - 30sec hold - Supine Shoulder Flexion AAROM with Hands Clasped  - 2 x daily - 7 x weekly - 2 sets - 10 reps - Seated Shoulder Flexion Towel Slide at Table Top  - 1 x daily - 7 x weekly - 2 sets - 10 reps - 3sec hold - Supine Shoulder External Rotation with Dowel  - 1 x daily - 7 x weekly - 2 sets - 10 reps - Supine Shoulder Flexion Extension Full Range AROM  - 2 x daily - 7 x weekly - 1-2 sets - 10 reps    ASSESSMENT:   CLINICAL IMPRESSION: ROM slowly improving but still very limited. Strength remains limited, not surprising given her postop restrictions. Pt having new Rt ulnar pain since weekend. Continued with interventions as previous planned. Will FU on new nerve symptoms if still persisting next visit.    OBJECTIVE IMPAIRMENTS: decreased ROM, decreased strength, hypomobility, increased edema, impaired UE functional use, postural dysfunction, and pain.    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    REHAB POTENTIAL: Excellent   CLINICAL DECISION MAKING: Evolving/moderate complexity   EVALUATION COMPLEXITY: Moderate     GOALS: Goals reviewed with patient? Yes   SHORT TERM GOALS: Target date: 11/09/2022   Pt will be independent with HEP to improve mobility and decrease pain to improve pain-free function at home Baseline: 10/24/22: Baseline HEP reviewed and demonstrated.   12/21/22: Pt is compliant with HEP  Goal status: ACHIEVED     LONG TERM GOALS: Target date: 01/18/2023   Pt will increase FOTO to at least 60 to demonstrate significant improvement in function at home and work related to neck pain  Baseline: 10/24/22: 42.    12/21/22: 47/60 Goal status:  IN PROGRESS   2.  Pt will decrease worst shoulder pain to no more than 2-3/10 on the NPRS in order to demonstrate clinically significant reduction in shoulder pain. Baseline: 10/24/22: 10/10 pain at worst.     12/21/22: 10/10 at worst.  Goal status: NOT MET   3.  Pt will have R shoulder AROM within at  least 10 degrees of contralateral upper limb without reproduction of shoulder pain as needed for reaching, self-care ADLs, and household chore performance Baseline: 10/24/22: Decreased PROM, no AROM testing due to current post-op timeline.    12/21/22: All motions improved, not within 10 deg of contralateral upper limb Goal status: IN PROGRESS/ON-GOING   4. Pt will increase strength for R shoulder to at least 4+/5 or greater grade in order to demonstrate improvement in strength and function as needed for lifting and carrying task Baseline: 10/24/22: Strength < 2 based on limited ROM (formal MMT deferred due to early post-op status).    12/21/22: Met for shoulder ER and elbow flexion; otherwise, remaining weakness less than 4+/5 (see chart above).   Goal status: ON-GOING     PLAN: PT FREQUENCY: 1-2x/week   PT DURATION: 6-8 weeks   PLANNED INTERVENTIONS: Therapeutic exercises, Therapeutic activity, Neuromuscular re-education,  Patient/Family education, Self Care, Joint mobilization, Joint manipulation, Dry Needling, Electrical stimulation, Cryotherapy, Moist heat, Manual therapy, and Re-evaluation.   PLAN FOR NEXT SESSION: Progressing to full AROM of shoulder as tolerated, progressive long-lever flexion; periscapular isotonics. STM and dry needling for TrP along R upper trapezius and R posterior deltoid/cuff prn. Continue with isometrics for RTC and deltoid; progress with isotonics/PRE as able.     8:27 AM, 01/16/23 Rosamaria Lints, PT, DPT Physical Therapist - [facility]  [phone] [facility])

## 2023-01-18 ENCOUNTER — Ambulatory Visit: Payer: Medicaid Other | Admitting: Physical Therapy

## 2023-01-18 DIAGNOSIS — M25511 Pain in right shoulder: Secondary | ICD-10-CM

## 2023-01-18 DIAGNOSIS — M25611 Stiffness of right shoulder, not elsewhere classified: Secondary | ICD-10-CM

## 2023-01-18 DIAGNOSIS — M6281 Muscle weakness (generalized): Secondary | ICD-10-CM

## 2023-01-18 NOTE — Therapy (Unsigned)
OUTPATIENT PHYSICAL THERAPY TREATMENT    Patient Name: Anita Mendoza MRN: [id number] DOB:24-Apr-1971, 52 y.o., female Today's Date: 01/18/2023  PCP:  No PCP on file  REFERRING PROVIDER: Gayleen Orem, MD   END OF SESSION:   PT End of Session - 01/18/23 0828     Visit Number 13    Number of Visits 25    Date for PT Re-Evaluation 03/01/23    Authorization Type [employer] 2024    Authorization Time Period 12/21/22-03/01/23    Progress Note Due on Visit 20    PT Start Time 0822    PT Stop Time 0902    PT Time Calculation (min) 40 min    Activity Tolerance Patient tolerated treatment well    Behavior During Therapy [facility] for tasks assessed/performed              Past Medical History:  Diagnosis Date   Asthma 2014   Hypertension    Past Surgical History:  Procedure Laterality Date   ABDOMINAL HYSTERECTOMY     carpul tunnel Bilateral 2011   choleycystectomy N/A 2000   ROTATOR CUFF REPAIR Left 2012   TONSILLECTOMY N/A 1979   There are no problems to display for this patient.   REFERRING DIAG: M25.511 (ICD-10-CM) - Acute pain of right shoulder   THERAPY DIAG:  Acute pain of right shoulder  Stiffness of right shoulder, not elsewhere classified  Muscle weakness (generalized)  Rationale for Evaluation and Treatment Rehabilitation  PERTINENT HISTORY: Pt is a 52 year old female s/p Right shoulder diagnostic arthroscopy, debridement, cyst excision along upper border of subscapularis, and open biceps tenodesis.  Patient reports notable post-operative pain - she reports having pain at night at this time. She reports using Gabapentin for pain control; she is avoiding use of oxycodone. Pt has cold compression machine to use at home. Patient reports notable pain/swelling along R upper trapezius region. Pt reports noticing bruising along top of arm or along distal anterior arm after getting rid of her abduction pillow. Pt reports history of numbness from head of  radius down to her hand - this has not been apparent following her surgery.    PAIN: 7/10 Rt upper trap area     PRECAUTIONS: None   WEIGHT BEARING RESTRICTIONS: Yes NWB RUE   FALLS: Has patient fallen in last 6 months? 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    SUBJECTIVE:  SUBJECTIVE STATEMENT:  Pt reports doing a lot of household work over weekend, ended up carrying to buckets of feed before she even thought about it and ended up flaring up electric-type pain along R elbow down to R hand. Next f/u with MD 02/06/23. Pt reports ongoing electric-type pain recently and ongoing pain affecting R upper trap/supraspinatus fossa region.   PAIN:  Are you having pain? 7/10 pain     OBJECTIVE:  TODAY'S TREATMENT 01/18/23   Manual Therapy - for shoulder ROM, prevention of shoulder stiffness post-operatively   STM R upper trap, R anterior and middle deltoid; x 10 minutes R shoulder PROM into flexion, scaption, and ER/IR within pt tolerance; x 2 minutes     Trigger Point Dry Needling (TDN), unbilled Education performed with patient regarding potential benefit and risks of TDN at previous follow-ups. Pt provided verbal consent to treatment. TDN performed to  R upper trapezius and R supraspinatus x 2 with 0.25 x 40 single needle placements with local twitch response (LTR). Pistoning technique utilized. Improved pain-free motion following intervention.         Therapeutic Exercise  -AA/ROM on UBE x 4 minutes, 2 fwd, 2 back  -Median nerve glide, 1 x 10, 1 sec hold, reviewed for HEP -Ulnar nerve glide; 1 x 10, 1 sec hold, reviewed for HEP -Standing wand flexion AAROM; 1x15 -Wall slide, flexion and abduction; x10, 5 sec hold   PATIENT EDUCATION: HEP update and review. We discussed expected post-operative progression of pain/motion/function and likely comorbid MSK conditions contributing to current condition.    *next visit*  - L side lying R shoulder ER: 1x10      PATIENT EDUCATION:  Education details: see above for patient education details Person educated: Patient and Spouse Education method: Explanation, Demonstration, and Handouts Education comprehension: verbalized understanding and returned demonstration     HOME EXERCISE PROGRAM:  Access Code: [email or web address] URL: [email or web address] Date: 12/21/2022 Prepared by: Consuela Mimes  Exercises - Flexion-Extension Shoulder Pendulum with Table Support  - 2 x daily - 7 x weekly - 2 sets - 10 reps - Seated Upper Trapezius Stretch  - 2 x daily - 7 x weekly - 3 sets - 30sec hold - Supine Shoulder Flexion AAROM with Hands Clasped  - 2 x daily - 7 x weekly - 2 sets - 10 reps - Seated Shoulder Flexion Towel Slide at Table Top  - 1 x daily - 7 x weekly - 2 sets - 10 reps - 3sec hold - Supine Shoulder External Rotation with Dowel  - 1 x daily - 7 x weekly - 2 sets - 10 reps - Supine Shoulder Flexion Extension Full Range AROM  - 2 x daily - 7 x weekly - 1-2 sets - 10 reps    ASSESSMENT:   CLINICAL IMPRESSION:  Patient has higher than expected pain at current post-operative time (at 12.5 weeks post-op). Pt has report of paresthesias and neuralgia-type symptoms following picking up bucket this previous weekend. Pt has responded well with DN for symptom modulation; this was utilized today with good symptomatic response. Pt demonstrates markedly improving forward elevation AAROM;  she has remaining pain with shoulder complex abduction A/AAROM. Pt is following up with MD in 3 weeks to discuss progress following her R shoulder arthroscopy. Patient has remaining deficits in ROM, mobility, and pain. Patient will benefit from continued skilled therapeutic intervention to address the above deficits as needed for improved function and QoL.     OBJECTIVE IMPAIRMENTS: decreased ROM, decreased strength,  hypomobility, increased edema, impaired UE functional use, postural dysfunction, and pain.    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    REHAB POTENTIAL: Excellent   CLINICAL DECISION MAKING: Evolving/moderate complexity   EVALUATION COMPLEXITY: Moderate     GOALS: Goals reviewed with patient? Yes   SHORT TERM GOALS: Target date: 11/09/2022   Pt will be independent with HEP to improve mobility and decrease pain to improve pain-free function at home Baseline: 10/24/22: Baseline HEP reviewed and demonstrated.   12/21/22: Pt is compliant with HEP  Goal status: ACHIEVED     LONG TERM GOALS: Target date: 01/18/2023   Pt will increase FOTO to at least 60 to demonstrate significant improvement in function at home and work related to neck pain  Baseline: 10/24/22: 42.    12/21/22: 47/60 Goal status: IN PROGRESS   2.  Pt will decrease worst shoulder pain to no more than 2-3/10 on the NPRS in order to demonstrate clinically significant reduction in shoulder pain. Baseline: 10/24/22: 10/10 pain at worst.     12/21/22: 10/10 at worst.  Goal status: NOT MET   3.  Pt will have R shoulder AROM within at least 10 degrees of contralateral upper limb without reproduction of shoulder pain as needed for  reaching, self-care ADLs, and household chore performance Baseline: 10/24/22: Decreased PROM, no AROM testing due to current post-op timeline.    12/21/22: All motions improved, not within 10 deg of contralateral upper limb Goal status: IN PROGRESS/ON-GOING   4. Pt will increase strength for R shoulder to at least 4+/5 or greater grade in order to demonstrate improvement in strength and function as needed for lifting and carrying task Baseline: 10/24/22: Strength < 2 based on limited ROM (formal MMT deferred due to early post-op status).    12/21/22: Met for shoulder ER and elbow flexion; otherwise, remaining weakness less than 4+/5 (see chart above).   Goal status: ON-GOING     PLAN: PT FREQUENCY: 1-2x/week   PT DURATION: 6-8 weeks   PLANNED INTERVENTIONS: Therapeutic exercises, Therapeutic activity, Neuromuscular re-education,  Patient/Family education, Self Care, Joint mobilization, Joint manipulation, Dry Needling, Electrical stimulation, Cryotherapy, Moist heat, Manual therapy, and Re-evaluation.   PLAN FOR NEXT SESSION: Progressing to full AROM of shoulder as tolerated, progressive long-lever flexion; periscapular isotonics. STM and dry needling for TrP along R upper trapezius and R posterior deltoid/cuff prn. Continue with isometrics for RTC and deltoid; progress with isotonics/PRE as able.     8:28 AM, 01/18/23 Consuela Mimes, PT, DPT [phone]

## 2023-01-19 ENCOUNTER — Encounter: Payer: Self-pay | Admitting: Physical Therapy

## 2023-01-23 ENCOUNTER — Ambulatory Visit: Payer: Medicaid Other | Admitting: Physical Therapy

## 2023-01-23 DIAGNOSIS — M25611 Stiffness of right shoulder, not elsewhere classified: Secondary | ICD-10-CM

## 2023-01-23 DIAGNOSIS — M25511 Pain in right shoulder: Secondary | ICD-10-CM | POA: Diagnosis not present

## 2023-01-23 DIAGNOSIS — M6281 Muscle weakness (generalized): Secondary | ICD-10-CM

## 2023-01-23 NOTE — Therapy (Signed)
OUTPATIENT PHYSICAL THERAPY TREATMENT    Patient Name: Anita Mendoza MRN: [id number] DOB:05-31-1971, 52 y.o., female Today's Date: 01/23/2023  PCP:  No PCP on file  REFERRING PROVIDER: Gayleen Orem, MD   END OF SESSION:   PT End of Session - 01/23/23 0821     Visit Number 14    Number of Visits 25    Date for PT Re-Evaluation 03/01/23    Authorization Type [employer] 2024    Authorization Time Period 12/21/22-03/01/23    Progress Note Due on Visit 20    PT Start Time 0821    PT Stop Time 0906    PT Time Calculation (min) 45 min    Activity Tolerance Patient tolerated treatment well    Behavior During Therapy [facility] for tasks assessed/performed             Past Medical History:  Diagnosis Date   Asthma 2014   Hypertension    Past Surgical History:  Procedure Laterality Date   ABDOMINAL HYSTERECTOMY     carpul tunnel Bilateral 2011   choleycystectomy N/A 2000   ROTATOR CUFF REPAIR Left 2012   TONSILLECTOMY N/A 1979   There are no problems to display for this patient.   REFERRING DIAG: M25.511 (ICD-10-CM) - Acute pain of right shoulder   THERAPY DIAG:  Acute pain of right shoulder  Stiffness of right shoulder, not elsewhere classified  Muscle weakness (generalized)  Rationale for Evaluation and Treatment Rehabilitation  PERTINENT HISTORY: Pt is a 52 year old female s/p Right shoulder diagnostic arthroscopy, debridement, cyst excision along upper border of subscapularis, and open biceps tenodesis.  Patient reports notable post-operative pain - she reports having pain at night at this time. She reports using Gabapentin for pain control; she is avoiding use of oxycodone. Pt has cold compression machine to use at home. Patient reports notable pain/swelling along R upper trapezius region. Pt reports noticing bruising along top of arm or along distal anterior arm after getting rid of her abduction pillow. Pt reports history of numbness from head of radius  down to her hand - this has not been apparent following her surgery.    PAIN: 7/10 Rt upper trap area     PRECAUTIONS: None   WEIGHT BEARING RESTRICTIONS: Yes NWB RUE   FALLS: Has patient fallen in last 6 months? 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    SUBJECTIVE:  SUBJECTIVE STATEMENT:  Pt reports doing well after dry needling last visit. She reports some soreness that day, but she reports doing well at this time in regard to R shoulder/R upper limb pain. She reports comorbid neck pain that is limiting her sleep at night. Patient reports no recent electric-type pain or paresthesias into R upper limb. She reports some numbness with lying on her R side and pain along middle deltoid region.   PAIN:  Are you having pain? No shoulder pain, comorbid neck pain     OBJECTIVE:  TODAY'S TREATMENT 01/23/23   Manual Therapy - for shoulder ROM, prevention of shoulder stiffness post-operatively   STM R upper trap, R anterior and middle deltoid; x 3 minutes R shoulder PROM into flexion, scaption, and ER/IR within pt tolerance; x 5 minutes Glenohumeral joint mobilization, inferior and A-P, gr I-II for pain control; 2 x 30 sec each   *not today* Trigger Point Dry Needling (TDN), unbilled Education performed with patient regarding potential benefit and risks of TDN at previous follow-ups. Pt provided verbal consent to treatment. TDN performed to  R upper  trapezius and R supraspinatus x 2 with 0.25 x 40 single needle placements with local twitch response (LTR). Pistoning technique utilized. Improved pain-free motion following intervention.        Therapeutic Exercise  -AA/ROM on UBE x 4 minutes, 2 fwd, 2 back   -MHP utilized along posterior C-spine/Uts for analgesic effect and soft tissue extensibility  -Wand AAROM for external rotaion at 90 deg  -Supine flexion AROM; 2x10 -L side lying R shoulder ER with 1-lb: 2x10  -increase resistance next visit -Wall slide, flexion and abduction; x10, 5 sec hold    *next visit* -Wall ABCs; A-Z capital letters   PATIENT EDUCATION: HEP update and review.    *not today* -Standing wand flexion AAROM; 1x15 -Median nerve glide, 1 x 10, 1 sec hold, reviewed for HEP -Ulnar nerve glide; 1 x 10, 1 sec hold, reviewed for HEP     PATIENT EDUCATION:  Education details: see above for patient education details Person educated: Patient and Spouse Education method: Explanation, Demonstration, and Handouts Education comprehension: verbalized understanding and returned demonstration     HOME EXERCISE PROGRAM:  Access Code: [email or web address] URL: [email or web address] Date: 01/23/2023 Prepared by: Consuela Mimes  Exercises - Shoulder Flexion Wall Slide with Towel  - 2 x daily - 7 x weekly - 2 sets - 10 reps - Standing Shoulder Abduction Wall Slide with Thumb Out  - 2 x daily - 7 x weekly - 2 sets - 10 reps - Seated Upper Trapezius Stretch  - 2 x daily - 7 x weekly - 3 sets - 30sec hold - Supine Shoulder Flexion Extension Full Range AROM  - 2 x daily - 7 x weekly - 1-2 sets - 10 reps - Supine Shoulder External Rotation with Dowel  - 2 x daily - 7 x weekly - 2 sets - 10 reps - Ulnar Nerve Flossing  - 2 x daily - 7 x weekly - 2 sets - 10 reps - 1sec hold - Median Nerve Flossing - Tray  - 2 x daily - 7 x weekly - 2 sets - 10 reps - 1sec hold    ASSESSMENT:   CLINICAL IMPRESSION:  Patient  demonstrates normalizing R shoulder ROM and tolerates PROM and gentle manual stretching relatively well this AM. She is still notably challenged with active long-lever shoulder flexion, especially with eccentric phase/returning arm to neutral position. We are able to  progress posterior cuff isotonics to include light resistance/small Dbell today. Pt fortunately has responded well with use of dry needling and is continuing with neurodynamics at home to address neuralgias/paresthesias experienced last week. Pt is following up with MD in 2 weeks to discuss progress following her R shoulder arthroscopy. Patient has remaining deficits in ROM, mobility, and pain. Patient will benefit from continued skilled therapeutic intervention to address the above deficits as needed for improved function and QoL.     OBJECTIVE IMPAIRMENTS: decreased ROM, decreased strength, hypomobility, increased edema, impaired UE functional use, postural dysfunction, and pain.    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    REHAB POTENTIAL: Excellent   CLINICAL DECISION MAKING: Evolving/moderate complexity   EVALUATION COMPLEXITY: Moderate     GOALS: Goals reviewed with patient? Yes   SHORT TERM GOALS: Target date: 11/09/2022   Pt will be independent with HEP to improve mobility and decrease pain to improve pain-free function at home Baseline: 10/24/22: Baseline HEP reviewed and demonstrated.   12/21/22: Pt is compliant with HEP  Goal status: ACHIEVED     LONG TERM GOALS: Target date: 01/18/2023   Pt will increase FOTO to at least 60 to demonstrate significant improvement in function at home and work related to neck pain  Baseline:  10/24/22: 42.    12/21/22: 47/60 Goal status: IN PROGRESS   2.  Pt will decrease worst shoulder pain to no more than 2-3/10 on the NPRS in order to demonstrate clinically significant reduction in shoulder pain. Baseline: 10/24/22: 10/10 pain at worst.     12/21/22: 10/10 at worst.  Goal status: NOT MET   3.  Pt will have R shoulder AROM within at least 10 degrees of contralateral upper limb without reproduction of shoulder pain as needed for reaching, self-care ADLs, and household chore performance Baseline: 10/24/22: Decreased PROM, no AROM testing due to current post-op timeline.    12/21/22: All motions improved, not within 10 deg of contralateral upper limb Goal status: IN PROGRESS/ON-GOING   4. Pt will increase strength for R shoulder to at least 4+/5 or greater grade in order to demonstrate improvement in strength and function as needed for lifting and carrying task Baseline: 10/24/22: Strength < 2 based on limited ROM (formal MMT deferred due to early post-op status).    12/21/22: Met for shoulder ER and elbow flexion; otherwise, remaining weakness less than 4+/5 (see chart above).   Goal status: ON-GOING     PLAN: PT FREQUENCY: 1-2x/week   PT DURATION: 6-8 weeks   PLANNED INTERVENTIONS: Therapeutic exercises, Therapeutic activity, Neuromuscular re-education,  Patient/Family education, Self Care, Joint mobilization, Joint manipulation, Dry Needling, Electrical stimulation, Cryotherapy, Moist heat, Manual therapy, and Re-evaluation.   PLAN FOR NEXT SESSION: Progressing to full AROM of shoulder as tolerated, progressive long-lever flexion; periscapular isotonics. STM and dry needling for TrP along R upper trapezius and R posterior deltoid/cuff prn. Continue with isometrics for RTC and deltoid; progress with isotonics/PRE as able.     9:06 AM, 01/23/23 Consuela Mimes, PT, DPT [phone]

## 2023-01-25 ENCOUNTER — Ambulatory Visit: Payer: Medicaid Other | Admitting: Physical Therapy

## 2023-01-30 ENCOUNTER — Encounter: Payer: Self-pay | Admitting: Physical Therapy

## 2023-01-30 ENCOUNTER — Ambulatory Visit: Payer: Medicaid Other | Admitting: Physical Therapy

## 2023-01-30 DIAGNOSIS — M25611 Stiffness of right shoulder, not elsewhere classified: Secondary | ICD-10-CM

## 2023-01-30 DIAGNOSIS — M25511 Pain in right shoulder: Secondary | ICD-10-CM | POA: Diagnosis not present

## 2023-01-30 DIAGNOSIS — M6281 Muscle weakness (generalized): Secondary | ICD-10-CM

## 2023-01-30 NOTE — Therapy (Signed)
OUTPATIENT PHYSICAL THERAPY TREATMENT    Patient Name: Anita Mendoza MRN: [id number] DOB:11/02/1970, 52 y.o., female Today's Date: 01/30/2023  PCP:  No PCP on file  REFERRING PROVIDER: Gayleen Orem, MD   END OF SESSION:   PT End of Session - 01/30/23 0815     Visit Number 15    Number of Visits 25    Date for PT Re-Evaluation 03/01/23    Authorization Type [employer] 2024    Authorization Time Period 12/21/22-03/01/23    Progress Note Due on Visit 20    PT Start Time 0815    PT Stop Time 0856    PT Time Calculation (min) 41 min    Activity Tolerance Patient tolerated treatment well    Behavior During Therapy [facility] for tasks assessed/performed              Past Medical History:  Diagnosis Date   Asthma 2014   Hypertension    Past Surgical History:  Procedure Laterality Date   ABDOMINAL HYSTERECTOMY     carpul tunnel Bilateral 2011   choleycystectomy N/A 2000   ROTATOR CUFF REPAIR Left 2012   TONSILLECTOMY N/A 1979   There are no problems to display for this patient.   REFERRING DIAG: M25.511 (ICD-10-CM) - Acute pain of right shoulder   THERAPY DIAG:  Acute pain of right shoulder  Stiffness of right shoulder, not elsewhere classified  Muscle weakness (generalized)  Rationale for Evaluation and Treatment Rehabilitation  PERTINENT HISTORY: Pt is a 52 year old female s/p Right shoulder diagnostic arthroscopy, debridement, cyst excision along upper border of subscapularis, and open biceps tenodesis.  Patient reports notable post-operative pain - she reports having pain at night at this time. She reports using Gabapentin for pain control; she is avoiding use of oxycodone. Pt has cold compression machine to use at home. Patient reports notable pain/swelling along R upper trapezius region. Pt reports noticing bruising along top of arm or along distal anterior arm after getting rid of her abduction pillow. Pt reports history of numbness from head of  radius down to her hand - this has not been apparent following her surgery.    PAIN: 7/10 Rt upper trap area     PRECAUTIONS: None   WEIGHT BEARING RESTRICTIONS: Yes NWB RUE   FALLS: Has patient fallen in last 6 months? 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     SUBJECTIVE:  SUBJECTIVE STATEMENT:  Pt reports being tired and having difficulties with emotional day on Father's Day. Patient reports some pain along bilat upper traps and some neck discomfort comorbidly. She feels that dry needling has helped significantly.   PAIN:  Are you having pain? No shoulder pain, comorbid neck pain      OBJECTIVE:  TODAY'S TREATMENT 01/30/23    Manual Therapy - for shoulder ROM, prevention of shoulder stiffness post-operatively   STM R upper trap, R anterior and middle deltoid; x 5 minutes R shoulder PROM into flexion, abduction, and ER/IR within pt tolerance; x 5 minutes Glenohumeral joint mobilization, inferior and A-P, gr I-II for pain control; 2 x 30 sec each    Trigger Point Dry Needling (TDN), unbilled Education performed with patient regarding potential benefit and risks of TDN at previous follow-ups. Pt provided verbal consent to treatment. TDN performed to  R supraspinatus x 2 with 0.25 x 40 single needle placements with local twitch response (LTR). Pistoning technique utilized. Improved pain-free motion following intervention.        Therapeutic  Exercise  -AA/ROM on UBE x 4 minutes, 2 fwd, 2 back   -Supine wand flexion AAROM; 1x20 -Wand AAROM for external rotation at 90 deg  -Supine flexion with 1-lb Dbell; 2x10 -Wall ABCs; A-Z capital letters  *next visit* -L shoulder ER with Red Tband; 2x10    PATIENT EDUCATION: HEP update to include addition of light 1-lb weight with shoulder flexion in supine.    *not today* -Wall slide, flexion and abduction; x10, 5 sec hold  -Median nerve glide, 1 x 10, 1 sec hold, reviewed for HEP -Ulnar nerve glide; 1 x 10, 1 sec hold, reviewed for HEP     PATIENT EDUCATION:  Education details: see above for patient education details Person educated: Patient and Spouse Education method: Explanation, Demonstration, and Handouts Education comprehension: verbalized understanding and returned demonstration     HOME EXERCISE PROGRAM:  Access Code: [email or web address] URL: [email or web address] Date: 01/23/2023 Prepared by: Consuela Mimes  Exercises - Shoulder Flexion Wall Slide with Towel  - 2 x daily - 7 x weekly - 2 sets - 10 reps - Standing Shoulder Abduction Wall Slide with Thumb Out  - 2 x daily - 7 x weekly - 2 sets - 10 reps - Seated Upper Trapezius Stretch  - 2 x daily - 7 x weekly - 3 sets - 30sec hold - Supine Shoulder Flexion Extension Full Range AROM  - 2 x daily - 7 x weekly - 1-2 sets - 10 reps - Supine Shoulder External Rotation with Dowel  - 2 x daily - 7 x weekly - 2 sets - 10 reps - Ulnar Nerve Flossing  - 2 x daily - 7 x weekly - 2 sets - 10 reps - 1sec hold - Median Nerve Flossing - Tray  - 2 x daily - 7 x weekly - 2 sets - 10 reps - 1sec hold    ASSESSMENT:   CLINICAL IMPRESSION:  Patient demonstrates good R shoulder PROM at this time with only mild end-range deficits with flexion and ER. She is able to access supine AROM into flexion without notable motion limitation, and we are able to add light weight to long-lever flexion in supine. Patient has participated  much better with PT after use of DN to modulate persistent upper quarter pain. Pt may have comorbid cervicalgia/cervicothoracic pain contributing to her current condition. In spite of notable psychosocial factors affecting her condition the last few days, pt is  able to markedly progress today with PRE for the long head of the biceps. Patient has remaining deficits in ROM, mobility, and pain. Patient will benefit from continued skilled therapeutic intervention to address the above deficits as needed for improved function and QoL.     OBJECTIVE IMPAIRMENTS: decreased ROM, decreased strength, hypomobility, increased edema, impaired UE functional use, postural dysfunction, and pain.    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    REHAB POTENTIAL: Excellent   CLINICAL DECISION MAKING: Evolving/moderate complexity   EVALUATION COMPLEXITY: Moderate     GOALS: Goals reviewed with patient? Yes   SHORT TERM GOALS: Target date: 11/09/2022   Pt will be independent with HEP to improve mobility and decrease pain to improve pain-free function at home Baseline: 10/24/22: Baseline HEP reviewed and demonstrated.   12/21/22: Pt is compliant with HEP  Goal status: ACHIEVED     LONG TERM GOALS: Target date: 01/18/2023   Pt will increase FOTO to at least 60 to demonstrate significant improvement in function at home and work related to neck pain  Baseline: 10/24/22: 42.    12/21/22: 47/60 Goal status: IN PROGRESS   2.  Pt will decrease worst shoulder pain to no more than 2-3/10 on the NPRS in order to demonstrate clinically significant reduction in shoulder pain. Baseline: 10/24/22: 10/10 pain at worst.     12/21/22: 10/10 at  worst.  Goal status: NOT MET   3.  Pt will have R shoulder AROM within at least 10 degrees of contralateral upper limb without reproduction of shoulder pain as needed for reaching, self-care ADLs, and household chore performance Baseline: 10/24/22: Decreased PROM, no AROM testing due to current post-op timeline.    12/21/22: All motions improved, not within 10 deg of contralateral upper limb Goal status: IN PROGRESS/ON-GOING   4. Pt will increase strength for R shoulder to at least 4+/5 or greater grade in order to demonstrate improvement in strength and function as needed for lifting and carrying task Baseline: 10/24/22: Strength < 2 based on limited ROM (formal MMT deferred due to early post-op status).    12/21/22: Met for shoulder ER and elbow flexion; otherwise, remaining weakness less than 4+/5 (see chart above).   Goal status: ON-GOING     PLAN: PT FREQUENCY: 1-2x/week   PT DURATION: 6-8 weeks   PLANNED INTERVENTIONS: Therapeutic exercises, Therapeutic activity, Neuromuscular re-education,  Patient/Family education, Self Care, Joint mobilization, Joint manipulation, Dry Needling, Electrical stimulation, Cryotherapy, Moist heat, Manual therapy, and Re-evaluation.   PLAN FOR NEXT SESSION: Progressing to full AROM of shoulder as tolerated, progressive long-lever flexion; periscapular isotonics. STM and dry needling for TrP along R upper trapezius and R posterior deltoid/cuff prn. Continue with isometrics for RTC and deltoid; progress with isotonics/PRE as able.     8:16 AM, 01/30/23 Consuela Mimes, PT, DPT [phone]

## 2023-02-01 ENCOUNTER — Ambulatory Visit: Payer: Medicaid Other | Admitting: Physical Therapy

## 2023-02-06 ENCOUNTER — Ambulatory Visit: Payer: Medicaid Other | Admitting: Physical Therapy

## 2023-02-06 DIAGNOSIS — M25511 Pain in right shoulder: Secondary | ICD-10-CM | POA: Diagnosis not present

## 2023-02-06 DIAGNOSIS — M6281 Muscle weakness (generalized): Secondary | ICD-10-CM

## 2023-02-06 DIAGNOSIS — M25611 Stiffness of right shoulder, not elsewhere classified: Secondary | ICD-10-CM

## 2023-02-06 NOTE — Therapy (Signed)
OUTPATIENT PHYSICAL THERAPY TREATMENT / GOAL UPDATE   Patient Name: Anita Mendoza MRN: [id number] DOB:Jun 26, 1971, 52 y.o., female Today's Date: 02/06/2023  PCP:  No PCP on file  REFERRING PROVIDER: Gayleen Orem, MD   END OF SESSION:   PT End of Session - 02/06/23 0819     Visit Number 16    Number of Visits 25    Date for PT Re-Evaluation 03/01/23    Authorization Type [employer] 2024    Authorization Time Period 12/21/22-03/01/23    Progress Note Due on Visit 20    PT Start Time 0819    PT Stop Time 0858    PT Time Calculation (min) 39 min    Activity Tolerance Patient tolerated treatment well    Behavior During Therapy [facility] for tasks assessed/performed             Past Medical History:  Diagnosis Date   Asthma 2014   Hypertension    Past Surgical History:  Procedure Laterality Date   ABDOMINAL HYSTERECTOMY     carpul tunnel Bilateral 2011   choleycystectomy N/A 2000   ROTATOR CUFF REPAIR Left 2012   TONSILLECTOMY N/A 1979   There are no problems to display for this patient.   REFERRING DIAG: M25.511 (ICD-10-CM) - Acute pain of right shoulder   THERAPY DIAG:  Acute pain of right shoulder  Stiffness of right shoulder, not elsewhere classified  Muscle weakness (generalized)  Rationale for Evaluation and Treatment Rehabilitation  PERTINENT HISTORY: Pt is a 52 year old female s/p Right shoulder diagnostic arthroscopy, debridement, cyst excision along upper border of subscapularis, and open biceps tenodesis.  Patient reports notable post-operative pain - she reports having pain at night at this time. She reports using Gabapentin for pain control; she is avoiding use of oxycodone. Pt has cold compression machine to use at home. Patient reports notable pain/swelling along R upper trapezius region. Pt reports noticing bruising along top of arm or along distal anterior arm after getting rid of her abduction pillow. Pt reports history of numbness from  head of radius down to her hand - this has not been apparent following her surgery.    PAIN: 7/10 Rt upper trap area     PRECAUTIONS: None   WEIGHT BEARING RESTRICTIONS: Yes NWB RUE   FALLS: Has patient fallen in last 6 months? 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     SUBJECTIVE:  SUBJECTIVE STATEMENT:  Pt reports pain affecting R posterior cuff region. Pt reports some pain with attempting to carry purse with her R arm. Patient reports pain affecting R posterior cuff and down to her R elbow along olecranon region. Patient reports 60% global rating of improvement to date. Patient reports difficulty with reaching overhead with R upper limb. Patent reports her L hand has to assist with numerous activities. Pt reports being weak and painful in R upper limb. Pt reports difficulty with reaching out to side with her R upper limb.   PAIN:  Are you having pain? 5-6/10 pain at arrival to PT     OBJECTIVE (objective measures completed at initial evaluation unless otherwise dated)       Patient Surveys  FOTO: 43, predicted outcome score of 60   Cognition Patient is oriented to person, place, and time.  Recent memory is intact.  Remote memory is intact.  Attention span and concentration are intact.  Expressive speech is intact.  Patient's fund of knowledge is within normal limits for educational level.                          Gross Musculoskeletal  Assessment Tremor: None Bulk: Normal Tone: Normal     Posture Self-selected rounded shoulder posture, inc thoracic kyphosis; anteriorly tilted scapulae     PROM              PROM (Normal range in degrees) PROM 10/24/2022 PROM 12/21/22 PROM 02/06/23    Right Left Right Left Right Left  Shoulder            Flexion 120 deg   153   155 172  Extension            Abduction     113   152 WNL  External Rotation 30 deg   83   82 WNL  Internal Rotation 45 deg   70   WNL WNL  Hands Behind Head            Hands Behind Back                         Elbow            Flexion WNL   WNL      Extension WNL   WNL      Pronation WNL   WNL      Supination WNL   WNL      (* = pain; Blank rows = not tested)     Shoulder AROM 12/21/22 Flexion: R 131, L 150 Abduction: R 106, L 167 ER: R 75, L 95  02/06/23 Flexion: R 141, L 152 Abduction: R 138, L 162 ER (arm at side): R 86, L 93 ER (at 90/90): R 69, L 89       LE MMT: MMT (out of 5) Right 10/24/2022 Left 10/24/2022 Right 12/21/22 Left 12/21/22 Right 02/06/23 Left 02/06/23  Shoulder         Flexion     3+* 4 3+ 4  Extension            Abduction     3+* 4 4-* 4  External rotation     4+ 4+ 4+ 4+  Internal rotation     4- 4+ 4* 4  Horizontal abduction            Horizontal adduction  Lower Trapezius            Rhomboids                         Elbow        Flexion     4+ 4+ 4+ 4+  Extension     4- 4+ 4 4+  Pronation            Supination                         (* = pain; Blank rows = not tested)   Sensation Grossly intact to light touch bilateral UE as determined by testing dermatomes C2-T2. Proprioception and hot/cold testing deferred on this date.   Reflexes Deferred   Palpation Location LEFT  RIGHT           Subocciptials      Cervical paraspinals      Upper Trapezius   1  Levator Scapulae   1  Rhomboid Major/Minor      Sternoclavicular joint   0  Acromioclavicular joint   1  Coracoid process   2  Long  head of biceps   2  Supraspinatus   2  Infraspinatus      Subscapularis      Teres Minor      Teres Major      Pectoralis Major      Pectoralis Minor      Anterior Deltoid   1  Lateral Deltoid   1  Posterior Deltoid   1  Latissimus Dorsi      Sternocleidomastoid      (Blank rows = not tested) Graded on 0-4 scale (0 = no pain, 1 = pain, 2 = pain with wincing/grimacing/flinching, 3 = pain with withdrawal, 4 = unwilling to allow palpation), (Blank rows = not tested)             TODAY'S TREATMENT 02/06/23    Manual Therapy - for shoulder ROM, prevention of shoulder stiffness post-operatively   STM R upper trap, R anterior and middle deltoid; x 2 minutes R shoulder PROM into flexion, abduction, and ER/IR within pt tolerance; x 5 minutes Glenohumeral joint mobilization, inferior and A-P, gr I-II for pain control; 2 x 30 sec each    Therapeutic Exercise  -AA/ROM on UBE x 4 minutes, 2 fwd, 2 back    *GOAL UPDATE PERFORMED   -Wall ball stabilization; 2 x 20 CW/CCW -Standing wand flexion AAROM; 1x20  -Wand AAROM for external rotation at 90 deg; reviewed  PATIENT EDUCATION: Discussed current progress, comorbid MSK conditions contributing to current condition, prognosis, plan of care   *next visit* -L shoulder ER with Red Tband; 2x10 -Supine flexion with 1-lb Dbell; 2x10 -Wall ABCs; A-Z capital letters  *not today* -Wall slide, flexion and abduction; x10, 5 sec hold  -Median nerve glide, 1 x 10, 1 sec hold, reviewed for HEP -Ulnar nerve glide; 1 x 10, 1 sec hold, reviewed for HEP     Cold pack (unbilled) - for anti-inflammatory and analgesic effect as needed for reduced pain and improved ability to participate in active PT intervention, along R shoulder in sitting, x 5 minutes      PATIENT EDUCATION:  Education details: see above for patient education details Person educated: Patient and Spouse Education method: Explanation, Demonstration, and  Handouts Education comprehension: verbalized understanding and returned demonstration     HOME EXERCISE PROGRAM:  Access Code: [email or web address] URL: [email or web address] Date: 01/23/2023 Prepared by: Consuela Mimes  Exercises - Shoulder Flexion Wall Slide with Towel  - 2 x daily - 7 x weekly - 2 sets - 10 reps - Standing Shoulder Abduction Wall Slide with Thumb Out  - 2 x daily - 7 x weekly - 2 sets - 10 reps - Seated Upper Trapezius Stretch  - 2 x daily - 7 x weekly - 3 sets - 30sec hold - Supine Shoulder Flexion Extension Full Range AROM  - 2 x daily - 7 x weekly - 1-2 sets - 10 reps - Supine Shoulder External Rotation with Dowel  - 2 x daily - 7 x weekly - 2 sets - 10 reps - Ulnar Nerve Flossing  - 2 x daily - 7 x weekly - 2 sets - 10 reps - 1sec hold - Median Nerve Flossing - Tray  - 2 x daily - 7 x weekly - 2 sets - 10 reps - 1sec hold    ASSESSMENT:   CLINICAL IMPRESSION:  Patient has notably improved ROM, though still has deficits actively and passively with R upper limb compared to LUE. Patient has minimally improved strength; modest improvement in IR and abduction MMTs. FOTO has not improved remarkably. NPRS at worst has improved by 2 points. She has ongoing deficits with reaching and overhead work as well as carrying tasks. Pt is following up with surgeon today to discuss post-op condition. Pt is having ongoing upper limb pain and decreased functional UE use further post-operatively than expected. Pt may have comorbid cervicalgia/cervicothoracic pain contributing to her current condition; she also has new referral for low back pain which will need to be addressed once pt is nearing end of post-op rehab. Patient has remaining deficits in R shoulder active and passive ROM, mobility, strength, and pain. Patient will benefit from continued skilled therapeutic intervention to address the above deficits as needed for improved function and QoL.     OBJECTIVE IMPAIRMENTS:  decreased ROM, decreased strength, hypomobility, increased edema, impaired UE functional use, postural dysfunction, and pain.    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    REHAB POTENTIAL: Excellent   CLINICAL DECISION MAKING: Evolving/moderate complexity   EVALUATION COMPLEXITY: Moderate     GOALS: Goals reviewed with patient? Yes   SHORT TERM GOALS: Target date: 11/09/2022   Pt will be independent with HEP to improve mobility and decrease pain to improve pain-free function at home Baseline: 10/24/22: Baseline HEP reviewed and demonstrated.   12/21/22: Pt is compliant with HEP  Goal status: ACHIEVED     LONG TERM GOALS: Target date: 01/18/2023   Pt will increase FOTO to at least 60 to demonstrate significant improvement in function at home and work related to neck pain  Baseline: 10/24/22: 42.    12/21/22: 47/60.   02/06/23: 45/60  Goal status: IN PROGRESS   2.  Pt will decrease worst shoulder pain to no more than 2-3/10 on the NPRS in order to demonstrate clinically significant reduction in shoulder pain. Baseline: 10/24/22: 10/10 pain at worst.     12/21/22: 10/10 at worst.    02/06/23: 8/10 at worst in AM Goal status: IN PROGRESS    3.  Pt will have R shoulder AROM within at least 10 degrees  of contralateral upper limb without reproduction of shoulder pain as needed for reaching, self-care ADLs, and household chore performance Baseline: 10/24/22: Decreased PROM, no AROM testing due to current post-op timeline.    12/21/22: All motions improved, not within 10 deg of contralateral upper limb.  02/06/23: Met for ER with arm at side, in progress for flexion and abduction. Goal status: IN PROGRESS/ON-GOING    4. Pt will increase strength for R shoulder to at least 4+/5 or greater grade in order to demonstrate improvement in strength and function as needed for lifting and carrying task Baseline: 10/24/22: Strength < 2 based on limited ROM (formal MMT deferred due to early post-op status).    12/21/22: Met for shoulder ER and elbow flexion; otherwise, remaining weakness less than 4+/5 (see chart above).    02/06/23: Met for ER and elbow flexion,.   Goal status: ON-GOING     PLAN: PT FREQUENCY: 1-2x/week   PT DURATION: 4 weeks   PLANNED INTERVENTIONS: Therapeutic exercises, Therapeutic activity, Neuromuscular re-education,  Patient/Family education, Self Care, Joint mobilization, Joint manipulation, Dry Needling, Electrical stimulation, Cryotherapy, Moist heat, Manual therapy, and Re-evaluation.   PLAN FOR NEXT SESSION: Progressing to full AROM of shoulder as tolerated, progressive long-lever flexion; periscapular isotonics. STM and dry needling for TrP along R upper trapezius and R posterior deltoid/cuff prn. Continue with isometrics for RTC and deltoid; progress with isotonics/PRE as able.  Pt following up with surgeon today 02/06/23.     8:19 AM, 02/06/23 Consuela Mimes, PT, DPT [phone]

## 2023-02-08 ENCOUNTER — Ambulatory Visit: Payer: Medicaid Other | Admitting: Physical Therapy

## 2023-02-14 ENCOUNTER — Ambulatory Visit: Payer: Medicaid Other | Attending: Orthopedic Surgery | Admitting: Physical Therapy

## 2023-02-14 DIAGNOSIS — M25611 Stiffness of right shoulder, not elsewhere classified: Secondary | ICD-10-CM | POA: Diagnosis present

## 2023-02-14 DIAGNOSIS — M25511 Pain in right shoulder: Secondary | ICD-10-CM | POA: Diagnosis present

## 2023-02-14 DIAGNOSIS — M6281 Muscle weakness (generalized): Secondary | ICD-10-CM | POA: Diagnosis present

## 2023-02-14 NOTE — Therapy (Signed)
OUTPATIENT PHYSICAL THERAPY TREATMENT    Patient Name: Anita Mendoza MRN: [id number] DOB:Nov 03, 1970, 52 y.o., female Today's Date: 02/14/2023  PCP:  No PCP on file  REFERRING PROVIDER: Gayleen Orem, MD   END OF SESSION:   PT End of Session - 02/14/23 1429     Visit Number 17    Number of Visits 25    Date for PT Re-Evaluation 03/01/23    Authorization Type [employer] 2024    Authorization Time Period 12/21/22-03/01/23    Progress Note Due on Visit 20    PT Start Time 1431    PT Stop Time 1513    PT Time Calculation (min) 42 min    Activity Tolerance Patient tolerated treatment well    Behavior During Therapy [facility] for tasks assessed/performed              Past Medical History:  Diagnosis Date   Asthma 2014   Hypertension    Past Surgical History:  Procedure Laterality Date   ABDOMINAL HYSTERECTOMY     carpul tunnel Bilateral 2011   choleycystectomy N/A 2000   ROTATOR CUFF REPAIR Left 2012   TONSILLECTOMY N/A 1979   There are no problems to display for this patient.   REFERRING DIAG: M25.511 (ICD-10-CM) - Acute pain of right shoulder   THERAPY DIAG:  Acute pain of right shoulder  Stiffness of right shoulder, not elsewhere classified  Muscle weakness (generalized)  Rationale for Evaluation and Treatment Rehabilitation  PERTINENT HISTORY: Pt is a 52 year old female s/p Right shoulder diagnostic arthroscopy, debridement, cyst excision along upper border of subscapularis, and open biceps tenodesis.  Patient reports notable post-operative pain - she reports having pain at night at this time. She reports using Gabapentin for pain control; she is avoiding use of oxycodone. Pt has cold compression machine to use at home. Patient reports notable pain/swelling along R upper trapezius region. Pt reports noticing bruising along top of arm or along distal anterior arm after getting rid of her abduction pillow. Pt reports history of numbness from head of  radius down to her hand - this has not been apparent following her surgery.    PAIN: 7/10 Rt upper trap area     PRECAUTIONS: None   WEIGHT BEARING RESTRICTIONS: Yes NWB RUE   FALLS: Has patient fallen in last 6 months? 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     SUBJECTIVE:  SUBJECTIVE STATEMENT:  Pt had f/u with surgeon, and he is considering hydrodilation for ongoing chronic R shoulder pain and mobility deficits following arthroscopy on 10/13/22. Pt will be undergoing ultrasound first to check for soft tissue condition/scar tissue. Pt reports difficulty with getting to sleep and states she cannot roll onto her R side.   PAIN:  Are you having pain? 6/10 pain at arrival to PT     OBJECTIVE (objective measures completed at initial evaluation unless otherwise dated)       Patient Surveys  FOTO: 36, predicted outcome score of 60   Cognition Patient is oriented to person, place, and time.  Recent memory is intact.  Remote memory is intact.  Attention span and concentration are intact.  Expressive speech is intact.  Patient's fund of knowledge is within normal limits for educational level.                          Gross Musculoskeletal Assessment Tremor: None Bulk: Normal Tone: Normal     Posture Self-selected rounded shoulder posture, inc thoracic kyphosis; anteriorly tilted scapulae     PROM              PROM (Normal range in degrees)  PROM 10/24/2022 PROM 12/21/22 PROM 02/06/23    Right Left Right Left Right Left  Shoulder            Flexion 120 deg   153   155 172  Extension            Abduction     113   152 WNL  External Rotation 30 deg   83   82 WNL  Internal Rotation 45 deg   70   WNL WNL  Hands Behind Head            Hands Behind Back                         Elbow            Flexion WNL   WNL      Extension WNL   WNL      Pronation WNL   WNL      Supination WNL   WNL      (* = pain; Blank rows = not tested)     Shoulder AROM 12/21/22 Flexion: R 131, L 150 Abduction: R 106, L 167 ER: R 75, L 95  02/06/23 Flexion: R 141, L 152 Abduction: R 138, L 162 ER (arm at side): R 86, L 93 ER (at 90/90): R 69, L 89       LE MMT: MMT (out of 5) Right 10/24/2022 Left 10/24/2022 Right 12/21/22 Left 12/21/22 Right 02/06/23 Left 02/06/23  Shoulder         Flexion     3+* 4 3+ 4  Extension            Abduction     3+* 4 4-* 4  External rotation     4+ 4+ 4+ 4+  Internal rotation     4- 4+ 4* 4  Horizontal abduction            Horizontal adduction            Lower Trapezius            Rhomboids  Elbow        Flexion     4+ 4+ 4+ 4+  Extension     4- 4+ 4 4+  Pronation            Supination                         (* = pain; Blank rows = not tested)   Sensation Grossly intact to light touch bilateral UE as determined by testing dermatomes C2-T2. Proprioception and hot/cold testing deferred on this date.   Reflexes Deferred   Palpation Location LEFT  RIGHT           Subocciptials      Cervical paraspinals      Upper Trapezius   1  Levator Scapulae   1  Rhomboid Major/Minor      Sternoclavicular joint   0  Acromioclavicular joint   1  Coracoid process   2  Long head of biceps   2  Supraspinatus   2  Infraspinatus      Subscapularis      Teres Minor      Teres Major      Pectoralis Major      Pectoralis Minor      Anterior Deltoid   1  Lateral Deltoid   1  Posterior  Deltoid   1  Latissimus Dorsi      Sternocleidomastoid      (Blank rows = not tested) Graded on 0-4 scale (0 = no pain, 1 = pain, 2 = pain with wincing/grimacing/flinching, 3 = pain with withdrawal, 4 = unwilling to allow palpation), (Blank rows = not tested)             TODAY'S TREATMENT 02/14/23    Manual Therapy - for shoulder ROM, prevention of shoulder stiffness post-operatively   STM R anterior and middle deltoid; x 2 minutes R shoulder PROM into flexion, abduction, and ER/IR within pt tolerance; x 8 minutes Glenohumeral joint mobilization, inferior and A-P, gr I-II for pain control; 2 x 30 sec each    Therapeutic Exercise  -AA/ROM on UBE x 4 minutes, 2 fwd, 2 back    -Supine flexion with 2-lb Dbell for 1x10 and 1-lb Dbell for 1x15 -Wall ball stabilization; 2 x 20 CW/CCW -Standing ball roll up wall; yellow physioball; x10   -L shoulder ER with Red Tband; 2x10  PATIENT EDUCATION: Discussed current POC given context of MD's recommendations, conserving visits under current insurance plan, and considering secondary referral for low back pain; we discussed tapering visits to 1x/week during late-phase rehab.    *next visit* -Wall ABCs; A-Z capital letters   *not today* -Wand AAROM for external rotation at 90 deg; reviewed -Standing wand flexion AAROM; 1x20 -Wall slide, flexion and abduction; x10, 5 sec hold  -Median nerve glide, 1 x 10, 1 sec hold, reviewed for HEP -Ulnar nerve glide; 1 x 10, 1 sec hold, reviewed for HEP    *declined* Cold pack (unbilled) - for anti-inflammatory and analgesic effect as needed for reduced pain and improved ability to participate in active PT intervention, along R shoulder in sitting, x 5 minutes      PATIENT EDUCATION:  Education details: see above for patient education details Person educated: Patient and Spouse Education method: Explanation, Demonstration, and Handouts Education comprehension: verbalized understanding  and returned demonstration     HOME EXERCISE PROGRAM:  Access Code: [email or web address] URL: [email or web address] Date: 01/23/2023 Prepared by: Consuela Mimes  Exercises - Shoulder Flexion Wall Slide with Towel  - 2 x daily - 7 x weekly - 2 sets - 10 reps - Standing Shoulder Abduction Wall Slide with Thumb Out  - 2 x daily - 7 x weekly - 2 sets - 10 reps - Seated Upper Trapezius Stretch  - 2 x daily - 7 x weekly - 3 sets - 30sec hold - Supine Shoulder Flexion Extension Full Range AROM  - 2 x daily - 7 x weekly - 1-2 sets - 10 reps - Supine Shoulder External Rotation with Dowel  - 2 x daily - 7 x weekly - 2 sets - 10 reps - Ulnar Nerve Flossing  - 2 x daily - 7 x weekly - 2 sets - 10 reps - 1sec hold - Median Nerve Flossing - Tray  - 2 x daily - 7 x weekly - 2 sets - 10 reps - 1sec hold    ASSESSMENT:   CLINICAL IMPRESSION:  Patient has ongoing significant pain in spite of significant time removed from her R shoulder arthroscopy. PROM end-feel is largely empty/pain-limited and pt is progressing slowly with resistance drills at this time. Pt has responded well with adjunct use of DN for pain control, but this has largely targeted periscapular musculature and may be associated with comorbid cervicothoracic condition. Pt's surgeon has suggested hydrodilation, and pt is awaiting ultrasound for R shoulder prior to moving forward with this procedure. Patient has remaining deficits in R shoulder active and passive ROM, mobility, strength, and pain. Patient will benefit from continued skilled therapeutic intervention to address the above deficits as needed for improved function and QoL.     OBJECTIVE IMPAIRMENTS: decreased ROM, decreased strength, hypomobility, increased edema, impaired UE functional use, postural dysfunction, and pain.    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    REHAB POTENTIAL: Excellent   CLINICAL DECISION MAKING: Evolving/moderate complexity   EVALUATION COMPLEXITY: Moderate     GOALS: Goals reviewed with patient? Yes   SHORT TERM GOALS: Target date: 11/09/2022   Pt will be independent with HEP to improve mobility and decrease pain to improve pain-free function at home Baseline: 10/24/22: Baseline HEP reviewed and demonstrated.   12/21/22: Pt is compliant with HEP  Goal status: ACHIEVED     LONG TERM GOALS: Target date: 01/18/2023   Pt will increase FOTO to at least 60 to demonstrate significant improvement in function at home and work related to neck pain  Baseline: 10/24/22: 42.    12/21/22: 47/60.   02/06/23: 45/60  Goal status: IN PROGRESS   2.  Pt will decrease worst shoulder pain to no more than 2-3/10 on the NPRS in order to demonstrate clinically significant reduction in shoulder pain. Baseline: 10/24/22: 10/10 pain at worst.     12/21/22: 10/10 at worst.    02/06/23: 8/10 at worst in AM Goal status: IN PROGRESS    3.  Pt will have R shoulder AROM within at least 10 degrees of contralateral upper limb without reproduction of shoulder pain as needed for reaching, self-care ADLs, and household chore performance Baseline: 10/24/22: Decreased PROM, no AROM testing due to current post-op timeline.    12/21/22: All motions improved, not within 10 deg of contralateral upper limb.  02/06/23: Met for ER with  arm at side, in progress for flexion and abduction. Goal status: IN PROGRESS/ON-GOING   4. Pt will increase strength for R shoulder to at least 4+/5 or greater grade in order to demonstrate improvement in strength and function as needed for lifting and carrying task Baseline: 10/24/22: Strength < 2 based on limited ROM (formal MMT deferred due to early post-op  status).    12/21/22: Met for shoulder ER and elbow flexion; otherwise, remaining weakness less than 4+/5 (see chart above).    02/06/23: Met for ER and elbow flexion,.   Goal status: ON-GOING     PLAN: PT FREQUENCY: 1-2x/week   PT DURATION: 4 weeks   PLANNED INTERVENTIONS: Therapeutic exercises, Therapeutic activity, Neuromuscular re-education,  Patient/Family education, Self Care, Joint mobilization, Joint manipulation, Dry Needling, Electrical stimulation, Cryotherapy, Moist heat, Manual therapy, and Re-evaluation.   PLAN FOR NEXT SESSION: Progressing to full AROM of shoulder as tolerated, progressive long-lever flexion; periscapular isotonics. STM and dry needling for TrP along R upper trapezius and R posterior deltoid/cuff prn. Continue with isometrics for RTC and deltoid; progress with isotonics/PRE as able.     2:36 PM, 02/14/23 Consuela Mimes, PT, DPT [phone]

## 2023-02-20 ENCOUNTER — Encounter: Payer: Self-pay | Admitting: Physical Therapy

## 2023-02-20 ENCOUNTER — Ambulatory Visit: Payer: Medicaid Other | Admitting: Physical Therapy

## 2023-02-20 NOTE — Therapy (Deleted)
OUTPATIENT PHYSICAL THERAPY TREATMENT    Patient Name: Anita Mendoza MRN: [id number] DOB:February 24, 1971, 52 y.o., female Today's Date: 02/20/2023  PCP:  No PCP on file  REFERRING PROVIDER: Gayleen Orem, MD   END OF SESSION:      Past Medical History:  Diagnosis Date   Asthma 2014   Hypertension    Past Surgical History:  Procedure Laterality Date   ABDOMINAL HYSTERECTOMY     carpul tunnel Bilateral 2011   choleycystectomy N/A 2000   ROTATOR CUFF REPAIR Left 2012   TONSILLECTOMY N/A 1979   There are no problems to display for this patient.   REFERRING DIAG: M25.511 (ICD-10-CM) - Acute pain of right shoulder   THERAPY DIAG:  Acute pain of right shoulder  Stiffness of right shoulder, not elsewhere classified  Muscle weakness (generalized)  Rationale for Evaluation and Treatment Rehabilitation  PERTINENT HISTORY: Pt is a 52 year old female s/p Right shoulder diagnostic arthroscopy, debridement, cyst excision along upper border of subscapularis, and open biceps tenodesis.  Patient reports notable post-operative pain - she reports having pain at night at this time. She reports using Gabapentin for pain control; she is avoiding use of oxycodone. Pt has cold compression machine to use at home. Patient reports notable pain/swelling along R upper trapezius region. Pt reports noticing bruising along top of arm or along distal anterior arm after getting rid of her abduction pillow. Pt reports history of numbness from head of radius down to her hand - this has not been apparent following her surgery.    PAIN: 7/10 Rt upper trap area     PRECAUTIONS: None   WEIGHT BEARING RESTRICTIONS: Yes NWB RUE   FALLS: Has patient fallen in last 6 months? 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     SUBJECTIVE:                                                                                                                                                                                      SUBJECTIVE STATEMENT:  Pt had f/u with surgeon, and he is considering hydrodilation for ongoing chronic R shoulder pain and mobility deficits following arthroscopy on 10/13/22. Pt will be undergoing ultrasound first to check for soft tissue condition/scar tissue. Pt reports difficulty with getting to sleep and states  she cannot roll onto her R side.   PAIN:  Are you having pain? 6/10 pain at arrival to PT     OBJECTIVE (objective measures completed at initial evaluation unless otherwise dated)       Patient Surveys  FOTO: 95, predicted outcome score of 60   Cognition Patient is oriented to person, place, and time.  Recent memory is intact.  Remote memory is intact.  Attention span and concentration are intact.  Expressive speech is intact.  Patient's fund of knowledge is within normal limits for educational level.                          Gross Musculoskeletal Assessment Tremor: None Bulk: Normal Tone: Normal     Posture Self-selected rounded shoulder posture, inc thoracic kyphosis; anteriorly tilted scapulae     PROM              PROM (Normal range in degrees) PROM 10/24/2022 PROM 12/21/22 PROM 02/06/23    Right Left Right Left Right Left  Shoulder            Flexion 120 deg   153   155 172  Extension            Abduction     113   152 WNL  External Rotation 30 deg   83   82 WNL  Internal Rotation 45 deg   70   WNL WNL  Hands Behind Head            Hands Behind Back                         Elbow            Flexion WNL   WNL      Extension WNL   WNL      Pronation WNL   WNL      Supination WNL   WNL       (* = pain; Blank rows = not tested)     Shoulder AROM 12/21/22 Flexion: R 131, L 150 Abduction: R 106, L 167 ER: R 75, L 95  02/06/23 Flexion: R 141, L 152 Abduction: R 138, L 162 ER (arm at side): R 86, L 93 ER (at 90/90): R 69, L 89       LE MMT: MMT (out of 5) Right 10/24/2022 Left 10/24/2022 Right 12/21/22 Left 12/21/22 Right 02/06/23 Left 02/06/23  Shoulder         Flexion     3+* 4 3+ 4  Extension            Abduction     3+* 4 4-* 4  External rotation     4+ 4+ 4+ 4+  Internal rotation     4- 4+ 4* 4  Horizontal abduction            Horizontal adduction            Lower Trapezius            Rhomboids                         Elbow        Flexion     4+ 4+ 4+ 4+  Extension     4- 4+ 4 4+  Pronation            Supination                         (* =  pain; Blank rows = not tested)   Sensation Grossly intact to light touch bilateral UE as determined by testing dermatomes C2-T2. Proprioception and hot/cold testing deferred on this date.   Reflexes Deferred   Palpation Location LEFT  RIGHT           Subocciptials      Cervical paraspinals      Upper Trapezius   1  Levator Scapulae   1  Rhomboid Major/Minor      Sternoclavicular joint   0  Acromioclavicular joint   1  Coracoid process   2  Long head of biceps   2  Supraspinatus   2  Infraspinatus      Subscapularis      Teres Minor      Teres Major      Pectoralis Major      Pectoralis Minor      Anterior Deltoid   1  Lateral Deltoid   1  Posterior Deltoid   1  Latissimus Dorsi      Sternocleidomastoid      (Blank rows = not tested) Graded on 0-4 scale (0 = no pain, 1 = pain, 2 = pain with wincing/grimacing/flinching, 3 = pain with withdrawal, 4 = unwilling to allow palpation), (Blank rows = not tested)             TODAY'S TREATMENT 02/20/23    Manual Therapy - for shoulder ROM, prevention of shoulder stiffness post-operatively   STM R anterior and middle deltoid; x 2 minutes R  shoulder PROM into flexion, abduction, and ER/IR within pt tolerance; x 8 minutes Glenohumeral joint mobilization, inferior and A-P, gr I-II for pain control; 2 x 30 sec each    Therapeutic Exercise  -AA/ROM on UBE x 4 minutes, 2 fwd, 2 back    -Supine flexion with 2-lb Dbell for 1x10 and 1-lb Dbell for 1x15 -Wall ball stabilization; 2 x 20 CW/CCW -Standing ball roll up wall; yellow physioball; x10   -L shoulder ER with Red Tband; 2x10  PATIENT EDUCATION: Discussed current POC given context of MD's recommendations, conserving visits under current insurance plan, and considering secondary referral for low back pain; we discussed tapering visits to 1x/week during late-phase rehab.    *next visit* -Wall ABCs; A-Z capital letters   *not today* -Wand AAROM for external rotation at 90 deg; reviewed -Standing wand flexion AAROM; 1x20 -Wall slide, flexion and abduction; x10, 5 sec hold  -Median nerve glide, 1 x 10, 1 sec hold, reviewed for HEP -Ulnar nerve glide; 1 x 10, 1 sec hold, reviewed for HEP    *declined* Cold pack (unbilled) - for anti-inflammatory and analgesic effect as needed for reduced pain and improved ability to participate in active PT intervention, along R shoulder in sitting, x 5 minutes      PATIENT EDUCATION:  Education details: see above for patient education details Person educated: Patient and Spouse Education method: Explanation, Demonstration, and Handouts Education comprehension: verbalized understanding and returned demonstration     HOME EXERCISE PROGRAM:  Access Code: [email or web address] URL: [email or web address] Date: 01/23/2023 Prepared by: Consuela Mimes  Exercises - Shoulder Flexion Wall Slide with Towel  - 2 x daily - 7 x weekly - 2 sets - 10 reps - Standing Shoulder Abduction Wall Slide with Thumb Out  - 2 x daily - 7 x weekly - 2 sets - 10 reps - Seated Upper Trapezius Stretch  - 2 x daily - 7 x weekly - 3 sets - 30sec hold -  Supine Shoulder Flexion Extension Full Range AROM  - 2 x daily - 7 x weekly - 1-2 sets - 10 reps - Supine Shoulder External Rotation with Dowel  - 2 x daily - 7 x weekly - 2 sets - 10 reps - Ulnar Nerve Flossing  - 2 x daily - 7 x weekly - 2 sets - 10 reps - 1sec hold - Median Nerve Flossing - Tray  - 2 x daily - 7 x weekly - 2 sets - 10 reps - 1sec hold    ASSESSMENT:   CLINICAL IMPRESSION:  Patient has ongoing significant pain in spite of significant time removed from her R shoulder arthroscopy. PROM end-feel is largely empty/pain-limited and pt is progressing slowly with resistance drills at this time. Pt has responded well with adjunct use of DN for pain control, but this has largely targeted periscapular musculature and may be associated with comorbid cervicothoracic condition. Pt's surgeon has suggested hydrodilation, and pt is awaiting ultrasound for R shoulder prior to moving forward with this procedure. Patient has remaining deficits in R shoulder active and passive ROM, mobility, strength, and pain. Patient will benefit from continued skilled therapeutic intervention to address the above deficits as needed for improved function and QoL.     OBJECTIVE IMPAIRMENTS: decreased ROM, decreased strength, hypomobility, increased edema, impaired UE functional use, postural dysfunction, and pain.    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    REHAB POTENTIAL: Excellent   CLINICAL DECISION MAKING: Evolving/moderate complexity   EVALUATION COMPLEXITY: Moderate     GOALS: Goals reviewed with patient? Yes   SHORT TERM GOALS: Target date: 11/09/2022   Pt will be independent with HEP to improve mobility  and decrease pain to improve pain-free function at home Baseline: 10/24/22: Baseline HEP reviewed and demonstrated.   12/21/22: Pt is compliant with HEP  Goal status: ACHIEVED     LONG TERM GOALS: Target date: 01/18/2023   Pt will increase FOTO to at least 60 to demonstrate significant improvement in function at home and work related to neck pain  Baseline: 10/24/22: 42.    12/21/22: 47/60.   02/06/23: 45/60  Goal status: IN PROGRESS   2.  Pt will decrease worst shoulder pain to no more than 2-3/10 on the NPRS in order to demonstrate clinically significant reduction in shoulder pain. Baseline: 10/24/22: 10/10 pain at worst.     12/21/22: 10/10 at worst.    02/06/23: 8/10 at worst in AM Goal status: IN PROGRESS    3.  Pt will have R shoulder AROM within at least 10 degrees of contralateral upper limb without reproduction of shoulder pain as needed for reaching, self-care ADLs, and household chore performance Baseline: 10/24/22: Decreased PROM, no AROM testing due to current post-op timeline.    12/21/22: All motions improved, not within 10 deg of contralateral upper limb.  02/06/23: Met for ER with arm at side, in progress for flexion and abduction. Goal status: IN PROGRESS/ON-GOING   4. Pt will increase strength for R shoulder to at least 4+/5 or greater grade in order to demonstrate improvement in strength and function as needed for lifting and carrying task Baseline: 10/24/22: Strength < 2 based on limited ROM (formal MMT deferred due to early post-op status).    12/21/22: Met  for shoulder ER and elbow flexion; otherwise, remaining weakness less than 4+/5 (see chart above).    02/06/23: Met for ER and elbow flexion,.   Goal status: ON-GOING     PLAN: PT FREQUENCY: 1-2x/week   PT DURATION: 4 weeks   PLANNED INTERVENTIONS: Therapeutic exercises, Therapeutic activity, Neuromuscular re-education,  Patient/Family education, Self Care, Joint mobilization, Joint manipulation, Dry Needling, Electrical stimulation,  Cryotherapy, Moist heat, Manual therapy, and Re-evaluation.   PLAN FOR NEXT SESSION: Progressing to full AROM of shoulder as tolerated, progressive long-lever flexion; periscapular isotonics. STM and dry needling for TrP along R upper trapezius and R posterior deltoid/cuff prn. Continue with isometrics for RTC and deltoid; progress with isotonics/PRE as able.     7:29 AM, 02/20/23 Consuela Mimes, PT, DPT [phone]

## 2023-02-22 ENCOUNTER — Ambulatory Visit: Payer: Medicaid Other | Admitting: Physical Therapy

## 2023-02-22 DIAGNOSIS — M6281 Muscle weakness (generalized): Secondary | ICD-10-CM

## 2023-02-22 DIAGNOSIS — M25611 Stiffness of right shoulder, not elsewhere classified: Secondary | ICD-10-CM

## 2023-02-22 DIAGNOSIS — M25511 Pain in right shoulder: Secondary | ICD-10-CM

## 2023-02-22 NOTE — Therapy (Signed)
OUTPATIENT PHYSICAL THERAPY TREATMENT    Patient Name: Anita Mendoza MRN: [id number] DOB:01/23/1971, 52 y.o., female Today's Date: 02/22/2023  PCP:  No PCP on file  REFERRING PROVIDER: Gayleen Orem, MD   END OF SESSION:   PT End of Session - 02/22/23 1015     Visit Number 18    Number of Visits 25    Date for PT Re-Evaluation 03/01/23    Authorization Type [employer] 2024    Authorization Time Period 12/21/22-03/01/23    Progress Note Due on Visit 20    PT Start Time 0957    PT Stop Time 1036    PT Time Calculation (min) 39 min    Activity Tolerance Patient tolerated treatment well    Behavior During Therapy [facility] for tasks assessed/performed               Past Medical History:  Diagnosis Date   Asthma 2014   Hypertension    Past Surgical History:  Procedure Laterality Date   ABDOMINAL HYSTERECTOMY     carpul tunnel Bilateral 2011   choleycystectomy N/A 2000   ROTATOR CUFF REPAIR Left 2012   TONSILLECTOMY N/A 1979   There are no problems to display for this patient.   REFERRING DIAG: M25.511 (ICD-10-CM) - Acute pain of right shoulder   THERAPY DIAG:  Acute pain of right shoulder  Stiffness of right shoulder, not elsewhere classified  Muscle weakness (generalized)  Rationale for Evaluation and Treatment Rehabilitation  PERTINENT HISTORY: Pt is a 52 year old female s/p Right shoulder diagnostic arthroscopy, debridement, cyst excision along upper border of subscapularis, and open biceps tenodesis.  Patient reports notable post-operative pain - she reports having pain at night at this time. She reports using Gabapentin for pain control; she is avoiding use of oxycodone. Pt has cold compression machine to use at home. Patient reports notable pain/swelling along R upper trapezius region. Pt reports noticing bruising along top of arm or along distal anterior arm after getting rid of her abduction pillow. Pt reports history of numbness from head of  radius down to her hand - this has not been apparent following her surgery.    PAIN: 7/10 Rt upper trap area     PRECAUTIONS: None   WEIGHT BEARING RESTRICTIONS: Yes NWB RUE   FALLS: Has patient fallen in last 6 months? 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     SUBJECTIVE:  SUBJECTIVE STATEMENT:  Pt is awaiting ultrasound 03/16/23 to check on scar tissue in R shoulder. She reports moderate pain affecting R upper trap and R middle deltoid region at this time.  PAIN:  Are you having pain? 5/10 pain at arrival to PT     OBJECTIVE (objective measures completed at initial evaluation unless otherwise dated)       Patient Surveys  FOTO: 23, predicted outcome score of 60   Cognition Patient is oriented to person, place, and time.  Recent memory is intact.  Remote memory is intact.  Attention span and concentration are intact.  Expressive speech is intact.  Patient's fund of knowledge is within normal limits for educational level.                          Gross Musculoskeletal Assessment Tremor: None Bulk: Normal Tone: Normal     Posture Self-selected rounded shoulder posture, inc thoracic kyphosis; anteriorly tilted scapulae     PROM              PROM (Normal range in degrees) PROM 10/24/2022 PROM 12/21/22 PROM 02/06/23    Right Left Right Left Right Left  Shoulder            Flexion 120 deg   153   155 172  Extension             Abduction     113   152 WNL  External Rotation 30 deg   83   82 WNL  Internal Rotation 45 deg   70   WNL WNL  Hands Behind Head            Hands Behind Back                         Elbow            Flexion WNL   WNL      Extension WNL   WNL      Pronation WNL   WNL      Supination WNL   WNL      (* = pain; Blank rows = not tested)     Shoulder AROM 12/21/22 Flexion: R 131, L 150 Abduction: R 106, L 167 ER: R 75, L 95  02/06/23 Flexion: R 141, L 152 Abduction: R 138, L 162 ER (arm at side): R 86, L 93 ER (at 90/90): R 69, L 89       LE MMT: MMT (out of 5) Right 10/24/2022 Left 10/24/2022 Right 12/21/22 Left 12/21/22 Right 02/06/23 Left 02/06/23  Shoulder         Flexion     3+* 4 3+ 4  Extension            Abduction     3+* 4 4-* 4  External rotation     4+ 4+ 4+ 4+  Internal rotation     4- 4+ 4* 4  Horizontal abduction            Horizontal adduction            Lower Trapezius            Rhomboids                         Elbow        Flexion     4+ 4+ 4+ 4+  Extension  4- 4+ 4 4+  Pronation            Supination                         (* = pain; Blank rows = not tested)   Sensation Grossly intact to light touch bilateral UE as determined by testing dermatomes C2-T2. Proprioception and hot/cold testing deferred on this date.   Reflexes Deferred   Palpation Location LEFT  RIGHT           Subocciptials      Cervical paraspinals      Upper Trapezius   1  Levator Scapulae   1  Rhomboid Major/Minor      Sternoclavicular joint   0  Acromioclavicular joint   1  Coracoid process   2  Long head of biceps   2  Supraspinatus   2  Infraspinatus      Subscapularis      Teres Minor      Teres Major      Pectoralis Major      Pectoralis Minor      Anterior Deltoid   1  Lateral Deltoid   1  Posterior Deltoid   1  Latissimus Dorsi      Sternocleidomastoid      (Blank rows = not tested) Graded on 0-4 scale (0 = no pain, 1 = pain, 2 = pain with  wincing/grimacing/flinching, 3 = pain with withdrawal, 4 = unwilling to allow palpation), (Blank rows = not tested)        TODAY'S TREATMENT 02/22/23    Manual Therapy - for shoulder ROM, prevention of shoulder stiffness post-operatively   STM R anterior and middle deltoid, pec major; x 5 minutes R shoulder PROM into flexion, abduction, and ER/IR within pt tolerance; x 5 minutes  *not today* Glenohumeral joint mobilization, inferior and A-P, gr I-II for pain control; 2 x 30 sec each    Therapeutic Exercise  -AA/ROM on UBE x 4 minutes, 2 fwd, 2 back    -Supine flexion with 2-lb Dbell for 1x8 and 1-lb Dbell for 1x15  -L shoulder ER and IR with Red Tband; 2x10  -demo and verbal cueing for eccentric control, exercise technique  -Wall ABCs; A-Z capital letters  PATIENT EDUCATION: HEP updated and reviewed. We discussed tapering of visits at this time to conserve PT visits following potential hydrodilation and for back pain referral.     *next visit* -Wall ball stabilization; 2 x 20 CW/CCW -Standing ball roll up wall; yellow physioball; x10    *not today* -Wand AAROM for external rotation at 90 deg; reviewed -Standing wand flexion AAROM; 1x20 -Wall slide, flexion and abduction; x10, 5 sec hold  -Median nerve glide, 1 x 10, 1 sec hold, reviewed for HEP -Ulnar nerve glide; 1 x 10, 1 sec hold, reviewed for HEP    *declined* Cold pack (unbilled) - for anti-inflammatory and analgesic effect as needed for reduced pain and improved ability to participate in active PT intervention, along R shoulder in sitting, x 5 minutes      PATIENT EDUCATION:  Education details: see above for patient education details Person educated: Patient and Spouse Education method: Explanation, Demonstration, and Handouts Education comprehension: verbalized understanding and returned demonstration     HOME EXERCISE PROGRAM:  Access Code: [email or web address] URL: [email or web address] Date:  01/23/2023 Prepared by: Consuela Mimes  Exercises - Shoulder Flexion Wall Slide with Towel  - 2 x daily - 7  x weekly - 2 sets - 10 reps - Standing Shoulder Abduction Wall Slide with Thumb Out  - 2 x daily - 7 x weekly - 2 sets - 10 reps - Seated Upper Trapezius Stretch  - 2 x daily - 7 x weekly - 3 sets - 30sec hold - Supine Shoulder Flexion Extension Full Range AROM  - 2 x daily - 7 x weekly - 1-2 sets - 10 reps - Supine Shoulder External Rotation with Dowel  - 2 x daily - 7 x weekly - 2 sets - 10 reps - Ulnar Nerve Flossing  - 2 x daily - 7 x weekly - 2 sets - 10 reps - 1sec hold - Median Nerve Flossing - Tray  - 2 x daily - 7 x weekly - 2 sets - 10 reps - 1sec hold    ASSESSMENT:   CLINICAL IMPRESSION:  Patient tolerates RTC isotonics and multi-planar AROM at wall (ABCs/wall slide) well this AM. Her HEP was updated to continue work on shoulder mobility, LHB progressive strengthening, and stabilization. Pt has experienced persistent pain following arthroscopy on 10/13/22. Pt has mild motion deficits with end-range ER and forward elevation with largely empty end-feel (pain-limited). Her plan of care will need to be modified based on potential need for PT following hydrodilation (pending diagnostic ultrasound) and for her secondary referral for back pain. Patient has remaining deficits in R shoulder active and passive ROM, mobility, strength, and pain. Patient will benefit from continued skilled therapeutic intervention to address the above deficits as needed for improved function and QoL.     OBJECTIVE IMPAIRMENTS: decreased ROM, decreased strength, hypomobility, increased edema, impaired UE functional use, postural dysfunction, and pain.    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    REHAB POTENTIAL: Excellent   CLINICAL DECISION MAKING: Evolving/moderate complexity   EVALUATION COMPLEXITY: Moderate     GOALS: Goals reviewed with patient? Yes   SHORT TERM GOALS: Target date: 11/09/2022   Pt will be independent with HEP to improve mobility and decrease pain to improve pain-free function at home Baseline: 10/24/22: Baseline HEP reviewed and demonstrated.   12/21/22: Pt is compliant with HEP  Goal status: ACHIEVED     LONG TERM GOALS: Target date: 01/18/2023   Pt will increase FOTO to at least 60 to demonstrate significant improvement in function at home and work related to neck pain  Baseline: 10/24/22: 42.    12/21/22: 47/60.   02/06/23: 45/60  Goal status: IN PROGRESS   2.  Pt will decrease worst shoulder pain to no more than 2-3/10 on the NPRS in order to demonstrate clinically significant reduction in shoulder pain. Baseline: 10/24/22: 10/10 pain at worst.     12/21/22: 10/10 at worst.    02/06/23: 8/10 at worst in AM Goal status: IN PROGRESS    3.  Pt will have R shoulder AROM within at least 10 degrees of contralateral upper limb without reproduction of shoulder pain as needed for reaching, self-care ADLs, and household chore performance Baseline: 10/24/22: Decreased PROM, no AROM testing due to current post-op timeline.    12/21/22: All motions improved, not within 10 deg of contralateral upper limb.  02/06/23: Met for ER with arm at side, in progress for flexion and abduction. Goal status: IN  PROGRESS/ON-GOING   4. Pt will increase strength for R shoulder to at least 4+/5 or greater grade in order to demonstrate improvement in strength and function as needed for lifting and carrying task Baseline: 10/24/22: Strength < 2 based on limited ROM (formal MMT deferred due to early post-op status).    12/21/22: Met for shoulder ER and elbow flexion; otherwise, remaining weakness less than 4+/5 (see  chart above).    02/06/23: Met for ER and elbow flexion,.   Goal status: ON-GOING     PLAN: PT FREQUENCY: 1-2x/week   PT DURATION: 4 weeks   PLANNED INTERVENTIONS: Therapeutic exercises, Therapeutic activity, Neuromuscular re-education,  Patient/Family education, Self Care, Joint mobilization, Joint manipulation, Dry Needling, Electrical stimulation, Cryotherapy, Moist heat, Manual therapy, and Re-evaluation.   PLAN FOR NEXT SESSION: Progressing to full AROM of shoulder as tolerated, progressive long-lever flexion; periscapular isotonics. STM and dry needling for TrP along R upper trapezius and R posterior deltoid/cuff prn. Continue with isometrics for RTC and deltoid; progress with isotonics/PRE as able.     12:32 PM, 02/22/23 Consuela Mimes, PT, DPT [phone]

## 2023-02-25 ENCOUNTER — Encounter: Payer: Self-pay | Admitting: Physical Therapy

## 2023-02-27 ENCOUNTER — Ambulatory Visit: Payer: Medicaid Other | Admitting: Physical Therapy

## 2023-02-27 ENCOUNTER — Encounter: Payer: Self-pay | Admitting: Physical Therapy

## 2023-02-27 DIAGNOSIS — M6281 Muscle weakness (generalized): Secondary | ICD-10-CM

## 2023-02-27 DIAGNOSIS — M25611 Stiffness of right shoulder, not elsewhere classified: Secondary | ICD-10-CM

## 2023-02-27 DIAGNOSIS — M25511 Pain in right shoulder: Secondary | ICD-10-CM | POA: Diagnosis not present

## 2023-02-27 NOTE — Therapy (Unsigned)
OUTPATIENT PHYSICAL THERAPY TREATMENT    Patient Name: Anita Mendoza MRN: [id number] DOB:[birth year], 52 y.o., female Today's Date: 02/27/2023  PCP:  No PCP on file  REFERRING PROVIDER: Gayleen Orem, MD   END OF SESSION:   PT End of Session - 02/27/23 1036     Visit Number 19    Number of Visits 25    Date for PT Re-Evaluation 03/01/23    Authorization Type [employer] 2024    Authorization Time Period 12/21/22-03/01/23    Progress Note Due on Visit 20    PT Start Time 1033    PT Stop Time 1120    PT Time Calculation (min) 47 min    Activity Tolerance Patient tolerated treatment well    Behavior During Therapy [facility] for tasks assessed/performed              Past Medical History:  Diagnosis Date   Asthma 2014   Hypertension    Past Surgical History:  Procedure Laterality Date   ABDOMINAL HYSTERECTOMY     carpul tunnel Bilateral 2011   choleycystectomy N/A 2000   ROTATOR CUFF REPAIR Left 2012   TONSILLECTOMY N/A 1979   There are no problems to display for this patient.   REFERRING DIAG: M25.511 (ICD-10-CM) - Acute pain of right shoulder   THERAPY DIAG:  Acute pain of right shoulder  Stiffness of right shoulder, not elsewhere classified  Muscle weakness (generalized)  Rationale for Evaluation and Treatment Rehabilitation  PERTINENT HISTORY: Pt is a 52 year old female s/p Right shoulder diagnostic arthroscopy, debridement, cyst excision along upper border of subscapularis, and open biceps tenodesis.  Patient reports notable post-operative pain - she reports having pain at night at this time. She reports using Gabapentin for pain control; she is avoiding use of oxycodone. Pt has cold compression machine to use at home. Patient reports notable pain/swelling along R upper trapezius region. Pt reports noticing bruising along top of arm or along distal anterior arm after getting rid of her abduction pillow. Pt reports history of numbness from head of  radius down to her hand - this has not been apparent following her surgery.    PAIN: 7/10 Rt upper trap area     PRECAUTIONS: None   WEIGHT BEARING RESTRICTIONS: Yes NWB RUE   FALLS: Has patient fallen in last 6 months? 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     SUBJECTIVE:  SUBJECTIVE STATEMENT:  Pt reports significant pain with attempted long-lever flexion with 2-lb Dbell. Patient reports she has needed more help with grooming and dressing due to pain and mobility deficits with L upper limb; she states she has needed more help over the previous month. Patient reports tolerating RTC isotonics well at home since last visit. She reports compliance with HEP.   PAIN:  Are you having pain? 8/10 pain at arrival to PT     OBJECTIVE (objective measures completed at initial evaluation unless otherwise dated)       Patient Surveys  FOTO: 71, predicted outcome score of 60   Cognition Patient is oriented to person, place, and time.  Recent memory is intact.  Remote memory is intact.  Attention span and concentration are intact.  Expressive speech is intact.  Patient's fund of knowledge is within normal limits for educational level.                          Gross Musculoskeletal Assessment Tremor: None Bulk: Normal Tone: Normal     Posture Self-selected rounded shoulder posture, inc thoracic kyphosis; anteriorly tilted scapulae     PROM               PROM (Normal range in degrees) PROM 10/24/2022 PROM 12/21/22 PROM 02/06/23    Right Left Right Left Right Left  Shoulder            Flexion 120 deg   153   155 172  Extension            Abduction     113   152 WNL  External Rotation 30 deg   83   82 WNL  Internal Rotation 45 deg   70   WNL WNL  Hands Behind Head            Hands Behind Back                         Elbow            Flexion WNL   WNL      Extension WNL   WNL      Pronation WNL   WNL      Supination WNL   WNL      (* = pain; Blank rows = not tested)     Shoulder AROM 12/21/22 Flexion: R 131, L 150 Abduction: R 106, L 167 ER: R 75, L 95  02/06/23 Flexion: R 141, L 152 Abduction: R 138, L 162 ER (arm at side): R 86, L 93 ER (at 90/90): R 69, L 89       LE MMT: MMT (out of 5) Right 10/24/2022 Left 10/24/2022 Right 12/21/22 Left 12/21/22 Right 02/06/23 Left 02/06/23  Shoulder         Flexion     3+* 4 3+ 4  Extension            Abduction     3+* 4 4-* 4  External rotation     4+ 4+ 4+ 4+  Internal rotation     4- 4+ 4* 4  Horizontal abduction            Horizontal adduction            Lower Trapezius            Rhomboids  Elbow        Flexion     4+ 4+ 4+ 4+  Extension     4- 4+ 4 4+  Pronation            Supination                         (* = pain; Blank rows = not tested)   Sensation Grossly intact to light touch bilateral UE as determined by testing dermatomes C2-T2. Proprioception and hot/cold testing deferred on this date.   Reflexes Deferred   Palpation Location LEFT  RIGHT           Subocciptials      Cervical paraspinals      Upper Trapezius   1  Levator Scapulae   1  Rhomboid Major/Minor      Sternoclavicular joint   0  Acromioclavicular joint   1  Coracoid process   2  Long head of biceps   2  Supraspinatus   2  Infraspinatus      Subscapularis      Teres Minor      Teres Major      Pectoralis Major      Pectoralis Minor      Anterior Deltoid    1  Lateral Deltoid   1  Posterior Deltoid   1  Latissimus Dorsi      Sternocleidomastoid      (Blank rows = not tested) Graded on 0-4 scale (0 = no pain, 1 = pain, 2 = pain with wincing/grimacing/flinching, 3 = pain with withdrawal, 4 = unwilling to allow palpation), (Blank rows = not tested)        TODAY'S TREATMENT 02/27/23    Manual Therapy - for shoulder ROM, prevention of shoulder stiffness post-operatively   STM R anterior and middle deltoid, pec major; x 5 minutes R shoulder PROM into flexion, abduction, and ER/IR within pt tolerance; x 5 minutes  *not today* Glenohumeral joint mobilization, inferior and A-P, gr I-II for pain control; 2 x 30 sec each    Trigger Point Dry Needling (TDN), unbilled Education performed with patient regarding potential benefit of TDN. Reviewed precautions and risks with patient. Reviewed special precautions/risks over lung fields which include pneumothorax. Reviewed signs and symptoms of pneumothorax and advised pt to go to ER immediately if these symptoms develop advise them of dry needling treatment. Extensive time spent with pt to ensure full understanding of TDN risks. Pt provided verbal consent to treatment. TDN performed to R middle deltoid and R upper trapezius x 2 with 0.25 x 40 single needle placements with local twitch response (LTR). Pistoning technique utilized. Improved pain-free motion following intervention.     Therapeutic Exercise  -AA/ROM on UBE x 4 minutes, 2 fwd, 2 back   Rhythmic stabilization, shoulder at 100 deg flexion in supine; x 2 minutes  -Supine flexion with 1-lb Dbell; 2x15  -Standing ball roll up wall; yellow physioball; x10   -Wall ball stabilization; 2 x 20 CW/CCW, rainbow ball (unweighted)  -L shoulder ER and IR with Red Tband; reviewed    PATIENT EDUCATION: HEP reviewed. We discussed following up with physician regarding other possible contributors to upper quarter pain.     *not today* -Wall  ABCs; A-Z capital letters -Wand AAROM for external rotation at 90 deg; reviewed -Standing wand flexion AAROM; 1x20 -Wall slide, flexion and abduction; x10, 5 sec hold  -Median nerve glide, 1 x 10, 1  sec hold, reviewed for HEP -Ulnar nerve glide; 1 x 10, 1 sec hold, reviewed for HEP    Cold pack (unbilled) - utilized post-treatment today for anti-inflammatory and analgesic effect as needed for reduced pain and improved ability to participate in active PT intervention, along R shoulder in sitting, x 5 minutes      PATIENT EDUCATION:  Education details: see above for patient education details Person educated: Patient and Spouse Education method: Explanation, Demonstration, and Handouts Education comprehension: verbalized understanding and returned demonstration     HOME EXERCISE PROGRAM:  Access Code: [email or web address] URL: [email or web address] Date: 01/23/2023 Prepared by: Consuela Mimes  Exercises - Shoulder Flexion Wall Slide with Towel  - 2 x daily - 7 x weekly - 2 sets - 10 reps - Standing Shoulder Abduction Wall Slide with Thumb Out  - 2 x daily - 7 x weekly - 2 sets - 10 reps - Seated Upper Trapezius Stretch  - 2 x daily - 7 x weekly - 3 sets - 30sec hold - Supine Shoulder Flexion Extension Full Range AROM  - 2 x daily - 7 x weekly - 1-2 sets - 10 reps - Supine Shoulder External Rotation with Dowel  - 2 x daily - 7 x weekly - 2 sets - 10 reps - Ulnar Nerve Flossing  - 2 x daily - 7 x weekly - 2 sets - 10 reps - 1sec hold - Median Nerve Flossing - Tray  - 2 x daily - 7 x weekly - 2 sets - 10 reps - 1sec hold    ASSESSMENT:   CLINICAL IMPRESSION:  Patient reports significant pain with attempted supine flexion with 2-lb Dbell. Pt has been significantly limited with pain over the previous month secondary to persistent R upper quarter and R shoulder/upper arm pain. Pt is awaiting ultrasound and potential hydrodilation to treat scar tissue in R shoulder. There is some  concern for comorbid contributors to pain including cervical spine referred pain or chronic pain syndrome. Pt is over 4 months post-operative and is expected to return to most activities in this time frame. Pt does demonstrate minimal motion loss with PROM, but she needs further work on strengthening, stabilization, and recovery of function. Patient has remaining deficits in R shoulder active and passive ROM, mobility, strength, and pain. Patient will benefit from continued skilled therapeutic intervention to address the above deficits as needed for improved function and QoL.     OBJECTIVE IMPAIRMENTS: decreased ROM, decreased strength, hypomobility, increased edema, impaired UE functional use, postural dysfunction, and pain.    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    REHAB POTENTIAL: Excellent   CLINICAL DECISION MAKING: Evolving/moderate complexity   EVALUATION COMPLEXITY: Moderate     GOALS: Goals reviewed with patient? Yes   SHORT TERM GOALS: Target date: 11/09/2022   Pt will be independent with HEP to improve mobility and decrease pain to improve pain-free function at home Baseline: 10/24/22: Baseline HEP reviewed and demonstrated.   12/21/22: Pt is compliant with HEP  Goal status: ACHIEVED     LONG TERM GOALS: Target date: 01/18/2023   Pt will increase FOTO to at least 60 to demonstrate significant improvement in function at home and work related to neck pain  Baseline: 10/24/22: 42.    12/21/22: 47/60.  02/06/23: 45/60  Goal status: IN PROGRESS   2.  Pt will decrease worst shoulder pain to no more than 2-3/10 on the NPRS in order to demonstrate clinically significant reduction in shoulder  pain. Baseline: 10/24/22: 10/10 pain at worst.     12/21/22: 10/10 at worst.    02/06/23: 8/10 at worst in AM Goal status: IN PROGRESS    3.  Pt will have R shoulder AROM within at least 10 degrees of contralateral upper limb without reproduction of shoulder pain as needed for reaching, self-care ADLs, and household chore performance Baseline: 10/24/22: Decreased PROM, no AROM testing due to current post-op timeline.    12/21/22: All motions improved, not within 10 deg of contralateral upper limb.  02/06/23: Met for ER with arm at side, in progress for flexion and abduction. Goal status: IN PROGRESS/ON-GOING   4. Pt will increase strength for R shoulder to at least 4+/5 or greater grade in order to demonstrate improvement in strength and function as needed for lifting and carrying task Baseline: 10/24/22: Strength < 2 based on limited ROM (formal MMT deferred due to early post-op status).    12/21/22: Met for shoulder ER and elbow flexion; otherwise, remaining weakness less than 4+/5 (see chart above).    02/06/23: Met for ER and elbow flexion,.   Goal status: ON-GOING     PLAN: PT FREQUENCY: 1-2x/week   PT DURATION: 4 weeks   PLANNED INTERVENTIONS: Therapeutic exercises, Therapeutic activity, Neuromuscular re-education,  Patient/Family education, Self Care, Joint mobilization, Joint manipulation, Dry Needling, Electrical stimulation, Cryotherapy, Moist heat, Manual therapy, and Re-evaluation.   PLAN FOR NEXT SESSION: Progressing to full AROM of shoulder as tolerated, progressive long-lever flexion; periscapular isotonics. STM and dry needling for TrP along R upper trapezius and R posterior deltoid/cuff prn. Continue with isometrics for RTC and deltoid; progress with isotonics/PRE as able.     12:43 PM, 02/27/23 Consuela Mimes, PT, DPT [phone]

## 2023-03-01 ENCOUNTER — Ambulatory Visit: Payer: Medicaid Other | Admitting: Physical Therapy

## 2023-03-06 ENCOUNTER — Ambulatory Visit: Payer: Medicaid Other | Admitting: Physical Therapy

## 2023-03-06 NOTE — Therapy (Deleted)
OUTPATIENT PHYSICAL THERAPY TREATMENT    Patient Name: Anita Mendoza MRN: [id number] DOB:12-Sep-1970, 52 y.o., female Today's Date: 03/06/2023  PCP:  No PCP on file  REFERRING PROVIDER: Gayleen Orem, MD   END OF SESSION:      Past Medical History:  Diagnosis Date   Asthma 2014   Hypertension    Past Surgical History:  Procedure Laterality Date   ABDOMINAL HYSTERECTOMY     carpul tunnel Bilateral 2011   choleycystectomy N/A 2000   ROTATOR CUFF REPAIR Left 2012   TONSILLECTOMY N/A 1979   There are no problems to display for this patient.   REFERRING DIAG: M25.511 (ICD-10-CM) - Acute pain of right shoulder   THERAPY DIAG:  Acute pain of right shoulder  Stiffness of right shoulder, not elsewhere classified  Muscle weakness (generalized)  Rationale for Evaluation and Treatment Rehabilitation  PERTINENT HISTORY: Pt is a 52 year old female s/p Right shoulder diagnostic arthroscopy, debridement, cyst excision along upper border of subscapularis, and open biceps tenodesis.  Patient reports notable post-operative pain - she reports having pain at night at this time. She reports using Gabapentin for pain control; she is avoiding use of oxycodone. Pt has cold compression machine to use at home. Patient reports notable pain/swelling along R upper trapezius region. Pt reports noticing bruising along top of arm or along distal anterior arm after getting rid of her abduction pillow. Pt reports history of numbness from head of radius down to her hand - this has not been apparent following her surgery.    PAIN: 7/10 Rt upper trap area     PRECAUTIONS: None   WEIGHT BEARING RESTRICTIONS: Yes NWB RUE   FALLS: Has patient fallen in last 6 months? 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     SUBJECTIVE:                                                                                                                                                                                      SUBJECTIVE STATEMENT:  Pt reports significant pain with attempted long-lever flexion with 2-lb Dbell. Patient reports she has needed more help with grooming and dressing due to pain and mobility deficits with L upper limb; she states she has needed more help over the previous month. Patient reports  tolerating RTC isotonics well at home since last visit. She reports compliance with HEP.   PAIN:  Are you having pain? 8/10 pain at arrival to PT     OBJECTIVE (objective measures completed at initial evaluation unless otherwise dated)       Patient Surveys  FOTO: 30, predicted outcome score of 60   Cognition Patient is oriented to person, place, and time.  Recent memory is intact.  Remote memory is intact.  Attention span and concentration are intact.  Expressive speech is intact.  Patient's fund of knowledge is within normal limits for educational level.                          Gross Musculoskeletal Assessment Tremor: None Bulk: Normal Tone: Normal     Posture Self-selected rounded shoulder posture, inc thoracic kyphosis; anteriorly tilted scapulae     PROM              PROM (Normal range in degrees) PROM 10/24/2022 PROM 12/21/22 PROM 02/06/23    Right Left Right Left Right Left  Shoulder            Flexion 120 deg   153   155 172  Extension            Abduction     113   152 WNL  External Rotation 30 deg   83   82 WNL  Internal Rotation 45 deg   70   WNL WNL  Hands Behind Head            Hands Behind Back                         Elbow            Flexion WNL   WNL      Extension WNL   WNL      Pronation WNL    WNL      Supination WNL   WNL      (* = pain; Blank rows = not tested)     Shoulder AROM 12/21/22 Flexion: R 131, L 150 Abduction: R 106, L 167 ER: R 75, L 95  02/06/23 Flexion: R 141, L 152 Abduction: R 138, L 162 ER (arm at side): R 86, L 93 ER (at 90/90): R 69, L 89       LE MMT: MMT (out of 5) Right 10/24/2022 Left 10/24/2022 Right 12/21/22 Left 12/21/22 Right 02/06/23 Left 02/06/23  Shoulder         Flexion     3+* 4 3+ 4  Extension            Abduction     3+* 4 4-* 4  External rotation     4+ 4+ 4+ 4+  Internal rotation     4- 4+ 4* 4  Horizontal abduction            Horizontal adduction            Lower Trapezius            Rhomboids                         Elbow        Flexion     4+ 4+ 4+ 4+  Extension     4- 4+ 4 4+  Pronation  Supination                         (* = pain; Blank rows = not tested)   Sensation Grossly intact to light touch bilateral UE as determined by testing dermatomes C2-T2. Proprioception and hot/cold testing deferred on this date.   Reflexes Deferred   Palpation Location LEFT  RIGHT           Subocciptials      Cervical paraspinals      Upper Trapezius   1  Levator Scapulae   1  Rhomboid Major/Minor      Sternoclavicular joint   0  Acromioclavicular joint   1  Coracoid process   2  Long head of biceps   2  Supraspinatus   2  Infraspinatus      Subscapularis      Teres Minor      Teres Major      Pectoralis Major      Pectoralis Minor      Anterior Deltoid   1  Lateral Deltoid   1  Posterior Deltoid   1  Latissimus Dorsi      Sternocleidomastoid      (Blank rows = not tested) Graded on 0-4 scale (0 = no pain, 1 = pain, 2 = pain with wincing/grimacing/flinching, 3 = pain with withdrawal, 4 = unwilling to allow palpation), (Blank rows = not tested)        TODAY'S TREATMENT 03/06/23    Manual Therapy - for shoulder ROM, prevention of shoulder stiffness post-operatively   STM R anterior and middle  deltoid, pec major; x 5 minutes R shoulder PROM into flexion, abduction, and ER/IR within pt tolerance; x 5 minutes  *not today* Glenohumeral joint mobilization, inferior and A-P, gr I-II for pain control; 2 x 30 sec each    Trigger Point Dry Needling (TDN), unbilled Education performed with patient regarding potential benefit of TDN. Reviewed precautions and risks with patient. Reviewed special precautions/risks over lung fields which include pneumothorax. Reviewed signs and symptoms of pneumothorax and advised pt to go to ER immediately if these symptoms develop advise them of dry needling treatment. Extensive time spent with pt to ensure full understanding of TDN risks. Pt provided verbal consent to treatment. TDN performed to R middle deltoid and R upper trapezius x 2 with 0.25 x 40 single needle placements with local twitch response (LTR). Pistoning technique utilized. Improved pain-free motion following intervention.     Therapeutic Exercise  -AA/ROM on UBE x 4 minutes, 2 fwd, 2 back   Rhythmic stabilization, shoulder at 100 deg flexion in supine; x 2 minutes  -Supine flexion with 1-lb Dbell; 2x15  -Standing ball roll up wall; yellow physioball; x10   -Wall ball stabilization; 2 x 20 CW/CCW, rainbow ball (unweighted)  -L shoulder ER and IR with Red Tband; reviewed    PATIENT EDUCATION: HEP reviewed. We discussed following up with physician regarding other possible contributors to upper quarter pain.     *not today* -Wall ABCs; A-Z capital letters -Wand AAROM for external rotation at 90 deg; reviewed -Standing wand flexion AAROM; 1x20 -Wall slide, flexion and abduction; x10, 5 sec hold  -Median nerve glide, 1 x 10, 1 sec hold, reviewed for HEP -Ulnar nerve glide; 1 x 10, 1 sec hold, reviewed for HEP    Cold pack (unbilled) - utilized post-treatment today for anti-inflammatory and analgesic effect as needed for reduced pain and improved ability to  participate in active  PT intervention, along R shoulder in sitting, x 5 minutes      PATIENT EDUCATION:  Education details: see above for patient education details Person educated: Patient and Spouse Education method: Explanation, Demonstration, and Handouts Education comprehension: verbalized understanding and returned demonstration     HOME EXERCISE PROGRAM:  Access Code: [email or web address] URL: [email or web address] Date: 01/23/2023 Prepared by: Consuela Mimes  Exercises - Shoulder Flexion Wall Slide with Towel  - 2 x daily - 7 x weekly - 2 sets - 10 reps - Standing Shoulder Abduction Wall Slide with Thumb Out  - 2 x daily - 7 x weekly - 2 sets - 10 reps - Seated Upper Trapezius Stretch  - 2 x daily - 7 x weekly - 3 sets - 30sec hold - Supine Shoulder Flexion Extension Full Range AROM  - 2 x daily - 7 x weekly - 1-2 sets - 10 reps - Supine Shoulder External Rotation with Dowel  - 2 x daily - 7 x weekly - 2 sets - 10 reps - Ulnar Nerve Flossing  - 2 x daily - 7 x weekly - 2 sets - 10 reps - 1sec hold - Median Nerve Flossing - Tray  - 2 x daily - 7 x weekly - 2 sets - 10 reps - 1sec hold    ASSESSMENT:   CLINICAL IMPRESSION:  Patient reports significant pain with attempted supine flexion with 2-lb Dbell. Pt has been significantly limited with pain over the previous month secondary to persistent R upper quarter and R shoulder/upper arm pain. Pt is awaiting ultrasound and potential hydrodilation to treat scar tissue in R shoulder. There is some concern for comorbid contributors to pain including cervical spine referred pain or chronic pain syndrome. Pt is over 4 months post-operative and is expected to return to most activities in this time frame. Pt does demonstrate minimal motion loss with PROM, but she needs further work on strengthening, stabilization, and recovery of function. Patient has remaining deficits in R shoulder active and passive ROM, mobility, strength, and pain. Patient will benefit  from continued skilled therapeutic intervention to address the above deficits as needed for improved function and QoL.     OBJECTIVE IMPAIRMENTS: decreased ROM, decreased strength, hypomobility, increased edema, impaired UE functional use, postural dysfunction, and pain.    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    REHAB POTENTIAL: Excellent   CLINICAL DECISION MAKING: Evolving/moderate complexity   EVALUATION COMPLEXITY: Moderate     GOALS: Goals reviewed with patient? Yes   SHORT TERM GOALS: Target date: 11/09/2022   Pt will be independent with HEP to improve mobility and decrease pain to improve pain-free function at home Baseline: 10/24/22: Baseline HEP reviewed and demonstrated.   12/21/22: Pt is compliant with HEP  Goal status: ACHIEVED     LONG TERM GOALS: Target date: 01/18/2023   Pt will increase FOTO to at least 60 to demonstrate significant improvement in function at home and work related to neck pain  Baseline: 10/24/22: 42.    12/21/22: 47/60.   02/06/23: 45/60  Goal status: IN PROGRESS   2.  Pt will decrease worst shoulder pain to no more than 2-3/10 on the NPRS in order to demonstrate clinically significant reduction in shoulder pain. Baseline: 10/24/22: 10/10  pain at worst.     12/21/22: 10/10 at worst.    02/06/23: 8/10 at worst in AM Goal status: IN PROGRESS    3.  Pt will have R shoulder AROM within at least 10 degrees of contralateral upper limb without reproduction of shoulder pain as needed for reaching, self-care ADLs, and household chore performance Baseline: 10/24/22: Decreased PROM, no AROM testing due to current post-op timeline.    12/21/22: All motions improved, not within 10  deg of contralateral upper limb.  02/06/23: Met for ER with arm at side, in progress for flexion and abduction. Goal status: IN PROGRESS/ON-GOING   4. Pt will increase strength for R shoulder to at least 4+/5 or greater grade in order to demonstrate improvement in strength and function as needed for lifting and carrying task Baseline: 10/24/22: Strength < 2 based on limited ROM (formal MMT deferred due to early post-op status).    12/21/22: Met for shoulder ER and elbow flexion; otherwise, remaining weakness less than 4+/5 (see chart above).    02/06/23: Met for ER and elbow flexion,.   Goal status: ON-GOING     PLAN: PT FREQUENCY: 1-2x/week   PT DURATION: 4 weeks   PLANNED INTERVENTIONS: Therapeutic exercises, Therapeutic activity, Neuromuscular re-education,  Patient/Family education, Self Care, Joint mobilization, Joint manipulation, Dry Needling, Electrical stimulation, Cryotherapy, Moist heat, Manual therapy, and Re-evaluation.   PLAN FOR NEXT SESSION: Progressing to full AROM of shoulder as tolerated, progressive long-lever flexion; periscapular isotonics. STM and dry needling for TrP along R upper trapezius and R posterior deltoid/cuff prn. Continue with isometrics for RTC and deltoid; progress with isotonics/PRE as able.     8:48 AM, 03/06/23 Consuela Mimes, PT, DPT [phone]

## 2023-03-08 ENCOUNTER — Encounter: Payer: Medicaid Other | Admitting: Physical Therapy

## 2023-03-08 ENCOUNTER — Ambulatory Visit: Payer: Medicaid Other | Admitting: Physical Therapy

## 2023-03-08 DIAGNOSIS — M25511 Pain in right shoulder: Secondary | ICD-10-CM | POA: Diagnosis not present

## 2023-03-08 DIAGNOSIS — M25611 Stiffness of right shoulder, not elsewhere classified: Secondary | ICD-10-CM

## 2023-03-08 DIAGNOSIS — M6281 Muscle weakness (generalized): Secondary | ICD-10-CM

## 2023-03-08 NOTE — Therapy (Signed)
OUTPATIENT PHYSICAL THERAPY TREATMENT AND PROGRESS NOTE/RE-CERTIFICATION   Dates of reporting period  02/06/23   to   03/08/23    Patient Name: Anita Mendoza MRN: [id number] DOB:February 04, 1971, 52 y.o., female Today's Date: 03/08/2023   END OF SESSION:   PT End of Session - 03/10/23 1016     Visit Number 20    Number of Visits 25    Date for PT Re-Evaluation 03/01/23    Authorization Type [employer] 2024    Authorization Time Period 12/21/22-03/01/23    Progress Note Due on Visit 20    PT Start Time 1332    PT Stop Time 1415    PT Time Calculation (min) 43 min    Activity Tolerance Patient tolerated treatment well    Behavior During Therapy [facility] for tasks assessed/performed               Past Medical History:  Diagnosis Date   Asthma 2014   Hypertension    Past Surgical History:  Procedure Laterality Date   ABDOMINAL HYSTERECTOMY     carpul tunnel Bilateral 2011   choleycystectomy N/A 2000   ROTATOR CUFF REPAIR Left 2012   TONSILLECTOMY N/A 1979   There are no problems to display for this patient.  PCP:  No PCP on file  REFERRING PROVIDER: Gayleen Orem, MD   REFERRING DIAG: M25.511 (ICD-10-CM) - Acute pain of right shoulder   THERAPY DIAG:  Acute pain of right shoulder - Plan: PT plan of care cert/re-cert  Stiffness of right shoulder, not elsewhere classified - Plan: PT plan of care cert/re-cert  Muscle weakness (generalized) - Plan: PT plan of care cert/re-cert  Rationale for Evaluation and Treatment Rehabilitation  PERTINENT HISTORY: Pt is a 52 year old female s/p Right shoulder diagnostic arthroscopy, debridement, cyst excision along upper border of subscapularis, and open biceps tenodesis.  Patient reports notable post-operative pain - she reports having pain at night at this time. She reports using Gabapentin for pain control; she is avoiding use of oxycodone. Pt has cold compression machine to use at home. Patient reports notable  pain/swelling along R upper trapezius region. Pt reports noticing bruising along top of arm or along distal anterior arm after getting rid of her abduction pillow. Pt reports history of numbness from head of radius down to her hand - this has not been apparent following her surgery.    PAIN: 7/10 Rt upper trap area     PRECAUTIONS: None   WEIGHT BEARING RESTRICTIONS: Yes NWB RUE   FALLS: Has patient fallen in last 6 months? 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     SUBJECTIVE:  SUBJECTIVE STATEMENT:  Pt reports notable improvement in shoulder pain over the previous week. She reports good response to DN last visit. She reports faith and prayer contributing significantly to her improvement. She reports tolerating self-care activities and reaching better. Pt reports completing supine shoulder flexion with Dbell with 1-lb weight up to 3 sets of 15 and tolerating this well. Pt reports compliance with HEP otherwise.   PAIN:  Are you having pain? Minimal R arm/shoulder pain at arrival     OBJECTIVE (objective measures completed at initial evaluation unless otherwise dated)       Patient Surveys  FOTO: 73, predicted outcome score of 60   Cognition Patient is oriented to person, place, and time.  Recent memory is intact.  Remote memory is intact.  Attention span and concentration are intact.  Expressive speech is intact.  Patient's fund of knowledge is  within normal limits for educational level.                          Gross Musculoskeletal Assessment Tremor: None Bulk: Normal Tone: Normal     Posture Self-selected rounded shoulder posture, inc thoracic kyphosis; anteriorly tilted scapulae     PROM              PROM (Normal range in degrees) PROM 10/24/2022 PROM 12/21/22 PROM 02/06/23    Right Left Right Left Right Left  Shoulder            Flexion 120 deg   153   155 172  Extension            Abduction     113   152 WNL  External Rotation 30 deg   83   82 WNL  Internal Rotation 45 deg   70   WNL WNL  Hands Behind Head            Hands Behind Back                         Elbow            Flexion WNL   WNL      Extension WNL   WNL      Pronation WNL   WNL      Supination WNL   WNL      (* = pain; Blank rows = not tested)     Shoulder AROM 12/21/22 Flexion: R 131, L 150 Abduction: R 106, L 167 ER: R 75, L 95  02/06/23 Flexion: R 141, L 152 Abduction: R 138, L 162 ER (arm at side): R 86, L 93 ER (at 90/90): R 69, L 89    03/08/23 Flexion: R 139, L 152 Abduction: R 139, L 162 ER (arm at side): R 88, L 93 ER (at 90/90): R 70, L 89 Functional ER: R T2, L T5 Functional IR: R T12, L T9    LE MMT: MMT (out of 5) Right 10/24/2022 Left 10/24/2022 Right 12/21/22 Left 12/21/22 Right 02/06/23 Left 02/06/23 Right 03/08/23 Left 03/08/23  Shoulder           Flexion     3+* 4 3+ 4 4-* 4+  Extension              Abduction     3+* 4 4-* 4 4-* 4*  External rotation     4+ 4+ 4+ 4+ 4+ 4  Internal rotation  4- 4+ 4* 4 4+ 4  Horizontal abduction              Horizontal adduction              Lower Trapezius              Rhomboids                             Elbow          Flexion     4+ 4+ 4+ 4+ 4+ 4+  Extension     4- 4+ 4 4+ 4 5  Pronation              Supination                             (* = pain; Blank rows = not tested)   Sensation Grossly intact to light touch bilateral UE as determined by testing  dermatomes C2-T2. Proprioception and hot/cold testing deferred on this date.   Reflexes Deferred   Palpation Location LEFT  RIGHT           Subocciptials      Cervical paraspinals      Upper Trapezius   1  Levator Scapulae   1  Rhomboid Major/Minor      Sternoclavicular joint   0  Acromioclavicular joint   1  Coracoid process   2  Long head of biceps   2  Supraspinatus   2  Infraspinatus      Subscapularis      Teres Minor      Teres Major      Pectoralis Major      Pectoralis Minor      Anterior Deltoid   1  Lateral Deltoid   1  Posterior Deltoid   1  Latissimus Dorsi      Sternocleidomastoid      (Blank rows = not tested) Graded on 0-4 scale (0 = no pain, 1 = pain, 2 = pain with wincing/grimacing/flinching, 3 = pain with withdrawal, 4 = unwilling to allow palpation), (Blank rows = not tested)        TODAY'S TREATMENT 03/10/23    Manual Therapy - for shoulder ROM, prevention of shoulder stiffness post-operatively   STM R anterior and middle deltoid, pec major; x 5 minutes R shoulder PROM into flexion, abduction, and ER/IR within pt tolerance; x 5 minutes Glenohumeral joint mobilization, inferior and A-P, gr I-II for pain control; 2 x 30 sec each    Therapeutic Exercise  -AA/ROM on UBE x 4 minutes, 2 fwd, 2 back    *GOAL UPDATE PERFORMED   -Rhythmic stabilization, shoulder at 100 deg flexion in supine; x 2 minutes  -Standing shoulder flexion with 1-lb Dbell, in standing; 1x10  -difficulty with maintaining eccentric phase with elbow fully extended  -Serratus slide with foam roller; 2x8   PATIENT EDUCATION: HEP reviewed. We discussed ongoing progression of LHB strengthening as tolerated and discussed continued POC. We discussed progress to date and anticipated tapering of visits prior to completing case for R shoulder.     *not today* -L shoulder ER and IR with Red Tband; reviewed -Wall ball stabilization; 2 x 20 CW/CCW, rainbow ball  (unweighted) -Wall ABCs; A-Z capital letters -Wand AAROM for external rotation at 90 deg; reviewed -Standing wand flexion AAROM; 1x20 -Wall slide, flexion and abduction; x10,  5 sec hold  -Median nerve glide, 1 x 10, 1 sec hold, reviewed for HEP -Ulnar nerve glide; 1 x 10, 1 sec hold, reviewed for HEP        PATIENT EDUCATION:  Education details: see above for patient education details Person educated: Patient and Spouse Education method: Explanation, Demonstration, and Handouts Education comprehension: verbalized understanding and returned demonstration     HOME EXERCISE PROGRAM:  Access Code: [email or web address] URL: [email or web address] Date: 01/23/2023 Prepared by: Consuela Mimes  Exercises - Shoulder Flexion Wall Slide with Towel  - 2 x daily - 7 x weekly - 2 sets - 10 reps - Standing Shoulder Abduction Wall Slide with Thumb Out  - 2 x daily - 7 x weekly - 2 sets - 10 reps - Seated Upper Trapezius Stretch  - 2 x daily - 7 x weekly - 3 sets - 30sec hold - Supine Shoulder Flexion Extension Full Range AROM  - 2 x daily - 7 x weekly - 1-2 sets - 10 reps - Supine Shoulder External Rotation with Dowel  - 2 x daily - 7 x weekly - 2 sets - 10 reps - Ulnar Nerve Flossing  - 2 x daily - 7 x weekly - 2 sets - 10 reps - 1sec hold - Median Nerve Flossing - Tray  - 2 x daily - 7 x weekly - 2 sets - 10 reps - 1sec hold    ASSESSMENT:   CLINICAL IMPRESSION:  Patient has progressed slowly following R shoulder arthroscopy with subscapularis cyst excision, biceps tenodesis, and debridement. Pt has experienced notable episodes of R shoulder pain and impaired functional UE use even up to 3.5-4 months post-operatively, beyond expected healing timeline. Pt has comorbid low back pain and may have central/peripheral sensitization contributing to her current condition given persisting pain. Pt reports notable improvement in pain status over the previous week and denies pain at arrival; she is  very devout in her faith and does attribute prayer and her faith to notable improvement in her condition. Pt has not met long-term goals to date and has minimal change in AROM noted today. PROM has been grossly WFL during manual therapy. Pt has fair improvement in strength compared to last goal update. She has good ER/IR strength and elbow flexion strength. Pt is continuing follow-up with referring physician and may undergo hydrodilation pending diagnostic ultrasound for her R shoulder. Patient has remaining deficits in R shoulder active and passive ROM, mobility, strength, and pain. Patient will benefit from continued skilled therapeutic intervention to address the above deficits as needed for improved function and QoL.     OBJECTIVE IMPAIRMENTS: decreased ROM, decreased strength, hypomobility, increased edema, impaired UE functional use, postural dysfunction, and pain.    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    REHAB POTENTIAL: Excellent   CLINICAL DECISION MAKING: Evolving/moderate complexity   EVALUATION COMPLEXITY: Moderate     GOALS: Goals reviewed with patient? Yes   SHORT TERM GOALS: Target date: 11/09/2022   Pt will be independent with HEP to improve mobility and decrease pain to improve pain-free function at home Baseline: 10/24/22: Baseline HEP reviewed and demonstrated.   12/21/22: Pt is compliant with HEP  Goal status: ACHIEVED     LONG TERM GOALS: Target date: 01/18/2023   Pt  will increase FOTO to at least 60 to demonstrate significant improvement in function at home and work related to neck pain  Baseline: 10/24/22: 42.    12/21/22: 47/60.   02/06/23: 45/60.   03/08/23: 44/60. Goal status:  IN PROGRESS   2.  Pt will decrease worst shoulder pain to no more than 2-3/10 on the NPRS in order to demonstrate clinically significant reduction in shoulder pain. Baseline: 10/24/22: 10/10 pain at worst.     12/21/22: 10/10 at worst.    02/06/23: 8/10 at worst in AM.   03/08/23: 7/10 at worst over previous week, 4/10 at worst this week.  Goal status: IN PROGRESS    3.  Pt will have R shoulder AROM within at least 10 degrees of contralateral upper limb without reproduction of shoulder pain as needed for reaching, self-care ADLs, and household chore performance Baseline: 10/24/22: Decreased PROM, no AROM testing due to current post-op timeline.    12/21/22: All motions improved, not within 10 deg of contralateral upper limb.  02/06/23: Met for ER with arm at side, in progress for flexion and abduction.    03/08/23: Not yet met for flexion, abduction, or ER at 90/90.  Goal status: NOT MET   4. Pt will increase strength for R shoulder to at least 4+/5 or greater grade in order to demonstrate improvement in strength and function as needed for lifting and carrying task Baseline: 10/24/22: Strength < 2 based on limited ROM (formal MMT deferred due to early post-op status).    12/21/22: Met for shoulder ER and elbow flexion; otherwise, remaining weakness less than 4+/5 (see chart above).    02/06/23: Met for ER and elbow flexion.   03/08/23: Met for ER/IR, and elbow flexion; not met for shoulder flexion and ABD.  Goal status: ON-GOING     PLAN: PT FREQUENCY: 1x/week   PT DURATION: 4 weeks   PLANNED INTERVENTIONS: Therapeutic exercises, Therapeutic activity, Neuromuscular re-education,  Patient/Family education, Self Care, Joint mobilization, Joint manipulation, Dry Needling, Electrical stimulation, Cryotherapy, Moist heat, Manual therapy, and Re-evaluation.   PLAN FOR NEXT SESSION: Focus on restoration of shoulder ROM, progressive strengthening for long-lever flexion of shoulder and ability to perform compound  lifting and reaching patterns, progressive stabilization work. Focus on progressing HEP and limiting number of PT visits. Pt will need to initiate new POC focused on low back once she is able to continue with home-based program for her shoulder.    11:06 AM, 03/10/23 Consuela Mimes, PT, DPT [phone]

## 2023-03-10 ENCOUNTER — Encounter: Payer: Self-pay | Admitting: Physical Therapy

## 2023-03-13 ENCOUNTER — Ambulatory Visit: Payer: Medicaid Other | Admitting: Physical Therapy

## 2023-03-13 ENCOUNTER — Encounter: Payer: Self-pay | Admitting: Physical Therapy

## 2023-03-13 DIAGNOSIS — M25511 Pain in right shoulder: Secondary | ICD-10-CM

## 2023-03-13 DIAGNOSIS — M25611 Stiffness of right shoulder, not elsewhere classified: Secondary | ICD-10-CM

## 2023-03-13 DIAGNOSIS — M6281 Muscle weakness (generalized): Secondary | ICD-10-CM

## 2023-03-13 NOTE — Therapy (Signed)
OUTPATIENT PHYSICAL THERAPY TREATMENT    Patient Name: Anita Mendoza MRN: [id number] DOB:[birth year], 52 y.o., female Today's Date: 03/13/2023   END OF SESSION:   PT End of Session - 03/13/23 1038     Visit Number 21    Number of Visits 25    Date for PT Re-Evaluation 03/01/23    Authorization Type [employer] 2024    Authorization Time Period 12/21/22-03/01/23    Progress Note Due on Visit 20    PT Start Time 1034    PT Stop Time 1113    PT Time Calculation (min) 39 min    Activity Tolerance Patient tolerated treatment well    Behavior During Therapy [facility] for tasks assessed/performed             Past Medical History:  Diagnosis Date   Asthma 2014   Hypertension    Past Surgical History:  Procedure Laterality Date   ABDOMINAL HYSTERECTOMY     carpul tunnel Bilateral 2011   choleycystectomy N/A 2000   ROTATOR CUFF REPAIR Left 2012   TONSILLECTOMY N/A 1979   There are no problems to display for this patient.  PCP:  No PCP on file  REFERRING PROVIDER: Gayleen Orem, MD   REFERRING DIAG: M25.511 (ICD-10-CM) - Acute pain of right shoulder   THERAPY DIAG:  Acute pain of right shoulder  Stiffness of right shoulder, not elsewhere classified  Muscle weakness (generalized)  Rationale for Evaluation and Treatment Rehabilitation  PERTINENT HISTORY: Pt is a 52 year old female s/p Right shoulder diagnostic arthroscopy, debridement, cyst excision along upper border of subscapularis, and open biceps tenodesis.  Patient reports notable post-operative pain - she reports having pain at night at this time. She reports using Gabapentin for pain control; she is avoiding use of oxycodone. Pt has cold compression machine to use at home. Patient reports notable pain/swelling along R upper trapezius region. Pt reports noticing bruising along top of arm or along distal anterior arm after getting rid of her abduction pillow. Pt reports history of numbness from head of  radius down to her hand - this has not been apparent following her surgery.    PAIN: 7/10 Rt upper trap area     PRECAUTIONS: None   WEIGHT BEARING RESTRICTIONS: Yes NWB RUE   FALLS: Has patient fallen in last 6 months? 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     SUBJECTIVE:  SUBJECTIVE STATEMENT:  Pt reports notable improvement in shoulder pain over the previous week. She reports good response to DN last visit. She reports faith and prayer contributing significantly to her improvement. She reports tolerating self-care activities and reaching better. Pt reports completing supine shoulder flexion with Dbell with 1-lb weight up to 3 sets of 15 and tolerating this well. Pt reports compliance with HEP otherwise.   PAIN:  Are you having pain? Minimal R arm/shoulder pain at arrival     OBJECTIVE (objective measures completed at initial evaluation unless otherwise dated)       Patient Surveys  FOTO: 44, predicted outcome score of 60   Cognition Patient is oriented to person, place, and time.  Recent memory is intact.  Remote memory is intact.  Attention span and concentration are intact.  Expressive speech is intact.  Patient's fund of knowledge is within normal limits for educational level.                          Gross Musculoskeletal Assessment Tremor: None Bulk: Normal Tone: Normal     Posture Self-selected rounded shoulder posture, inc  thoracic kyphosis; anteriorly tilted scapulae     PROM              PROM (Normal range in degrees) PROM 10/24/2022 PROM 12/21/22 PROM 02/06/23    Right Left Right Left Right Left  Shoulder            Flexion 120 deg   153   155 172  Extension            Abduction     113   152 WNL  External Rotation 30 deg   83   82 WNL  Internal Rotation 45 deg   70   WNL WNL  Hands Behind Head            Hands Behind Back                         Elbow            Flexion WNL   WNL      Extension WNL   WNL      Pronation WNL   WNL      Supination WNL   WNL      (* = pain; Blank rows = not tested)     Shoulder AROM 12/21/22 Flexion: R 131, L 150 Abduction: R 106, L 167 ER: R 75, L 95  02/06/23 Flexion: R 141, L 152 Abduction: R 138, L 162 ER (arm at side): R 86, L 93 ER (at 90/90): R 69, L 89    03/08/23 Flexion: R 139, L 152 Abduction: R 139, L 162 ER (arm at side): R 88, L 93 ER (at 90/90): R 70, L 89 Functional ER: R T2, L T5 Functional IR: R T12, L T9    LE MMT: MMT (out of 5) Right 10/24/2022 Left 10/24/2022 Right 12/21/22 Left 12/21/22 Right 02/06/23 Left 02/06/23 Right 03/08/23 Left 03/08/23  Shoulder           Flexion     3+* 4 3+ 4 4-* 4+  Extension              Abduction     3+* 4 4-* 4 4-* 4*  External rotation     4+ 4+ 4+ 4+ 4+ 4  Internal rotation  4- 4+ 4* 4 4+ 4  Horizontal abduction              Horizontal adduction              Lower Trapezius              Rhomboids                             Elbow          Flexion     4+ 4+ 4+ 4+ 4+ 4+  Extension     4- 4+ 4 4+ 4 5  Pronation              Supination                             (* = pain; Blank rows = not tested)   Sensation Grossly intact to light touch bilateral UE as determined by testing dermatomes C2-T2. Proprioception and hot/cold testing deferred on this date.   Reflexes Deferred   Palpation Location LEFT  RIGHT           Subocciptials      Cervical paraspinals      Upper Trapezius   1   Levator Scapulae   1  Rhomboid Major/Minor      Sternoclavicular joint   0  Acromioclavicular joint   1  Coracoid process   2  Long head of biceps   2  Supraspinatus   2  Infraspinatus      Subscapularis      Teres Minor      Teres Major      Pectoralis Major      Pectoralis Minor      Anterior Deltoid   1  Lateral Deltoid   1  Posterior Deltoid   1  Latissimus Dorsi      Sternocleidomastoid      (Blank rows = not tested) Graded on 0-4 scale (0 = no pain, 1 = pain, 2 = pain with wincing/grimacing/flinching, 3 = pain with withdrawal, 4 = unwilling to allow palpation), (Blank rows = not tested)        TODAY'S TREATMENT 03/13/23    Manual Therapy - for shoulder ROM, prevention of shoulder stiffness post-operatively   R shoulder PROM into flexion, abduction, and ER/IR within pt tolerance with gentle overpressure as tolerated; x 5 minutes   *not today* STM R anterior and middle deltoid, pec major; x 5 minutes Glenohumeral joint mobilization, inferior and A-P, gr I-II for pain control; 2 x 30 sec each    Therapeutic Exercise  -AA/ROM on UBE x 4 minutes, 2 fwd, 2 back    -Rhythmic stabilization, shoulder at 100 deg flexion in supine; 2 x 1 minutes  -Standing shoulder flexion AROM, in standing; 2x10  -Wall ball stabilization; 2 x 20 CW/CCW, 2.2-lb ball   -Serratus slide with foam roller; 2x8  -L shoulder ER  Red Tband; 2x10  -L shoulder flexion reactive isometric, forward walkout with Red Tband; 2x10 fwd/bwd  -L shoulder bilateral Tband row; Blue Tband; 2x15, 3 sec hold  Relatively easy for patient    PATIENT EDUCATION: HEP reviewed. We discussed ongoing progression of LHB strengthening as tolerated and discussed continued POC.     *not today* -Wall ABCs; A-Z capital letters -Wand AAROM for external rotation at 90 deg; reviewed -Standing wand flexion AAROM; 1x20 -  Wall slide, flexion and abduction; x10, 5 sec hold  -Median nerve glide, 1 x 10, 1 sec hold,  reviewed for HEP -Ulnar nerve glide; 1 x 10, 1 sec hold, reviewed for HEP        PATIENT EDUCATION:  Education details: see above for patient education details Person educated: Patient and Spouse Education method: Explanation, Demonstration, and Handouts Education comprehension: verbalized understanding and returned demonstration     HOME EXERCISE PROGRAM:  Access Code: [email or web address] URL: [email or web address] Date: 01/23/2023 Prepared by: Consuela Mimes  Exercises - Shoulder Flexion Wall Slide with Towel  - 2 x daily - 7 x weekly - 2 sets - 10 reps - Standing Shoulder Abduction Wall Slide with Thumb Out  - 2 x daily - 7 x weekly - 2 sets - 10 reps - Seated Upper Trapezius Stretch  - 2 x daily - 7 x weekly - 3 sets - 30sec hold - Supine Shoulder Flexion Extension Full Range AROM  - 2 x daily - 7 x weekly - 1-2 sets - 10 reps - Supine Shoulder External Rotation with Dowel  - 2 x daily - 7 x weekly - 2 sets - 10 reps - Ulnar Nerve Flossing  - 2 x daily - 7 x weekly - 2 sets - 10 reps - 1sec hold - Median Nerve Flossing - Tray  - 2 x daily - 7 x weekly - 2 sets - 10 reps - 1sec hold    ASSESSMENT:   CLINICAL IMPRESSION:  Patient demonstrates ongoing AROM deficit in standing largely attributed to weakness with long-arm shoulder flexion. She has ongoing limitation with reaching and lifting any significant weight with R arm. Pt is experiencing comorbid L shoulder pain due to her L arm having to complete most activities with ongoing R arm functional deficits. Patient's post-op rehab has been prolonged due to persistent pain and difficulty with activity tolerance. Pain is fortunately minimal for R shoulder/arm at this time. Pt is continuing follow-up with referring physician and may undergo hydrodilation pending diagnostic ultrasound for her R shoulder. Patient has remaining deficits in R shoulder active and passive ROM, mobility, strength, and pain. Patient will benefit from  continued skilled therapeutic intervention to address the above deficits as needed for improved function and QoL.     OBJECTIVE IMPAIRMENTS: decreased ROM, decreased strength, hypomobility, increased edema, impaired UE functional use, postural dysfunction, and pain.    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    REHAB POTENTIAL: Excellent   CLINICAL DECISION MAKING: Evolving/moderate complexity   EVALUATION COMPLEXITY: Moderate     GOALS: Goals reviewed with patient? Yes   SHORT TERM GOALS: Target date: 11/09/2022   Pt will be independent with HEP to improve mobility and decrease pain to improve pain-free function at home Baseline: 10/24/22: Baseline HEP reviewed and demonstrated.   12/21/22: Pt is compliant with HEP  Goal status: ACHIEVED     LONG TERM GOALS: Target date: 01/18/2023   Pt will increase FOTO to at least 60 to demonstrate significant improvement in function at home and work related to neck pain  Baseline: 10/24/22: 42.    12/21/22: 47/60.   02/06/23: 45/60.   03/08/23: 44/60. Goal status: IN PROGRESS   2.  Pt will decrease worst shoulder pain to no more  than 2-3/10 on the NPRS in order to demonstrate clinically significant reduction in shoulder pain. Baseline: 10/24/22: 10/10 pain at worst.     12/21/22: 10/10 at worst.    02/06/23: 8/10 at worst in AM.   03/08/23: 7/10 at worst over previous week, 4/10 at worst this week.  Goal status: IN PROGRESS    3.  Pt will have R shoulder AROM within at least 10 degrees of contralateral upper limb without reproduction of shoulder pain as needed for reaching, self-care ADLs, and household chore performance Baseline: 10/24/22: Decreased PROM, no AROM  testing due to current post-op timeline.    12/21/22: All motions improved, not within 10 deg of contralateral upper limb.  02/06/23: Met for ER with arm at side, in progress for flexion and abduction.    03/08/23: Not yet met for flexion, abduction, or ER at 90/90.  Goal status: NOT MET   4. Pt will increase strength for R shoulder to at least 4+/5 or greater grade in order to demonstrate improvement in strength and function as needed for lifting and carrying task Baseline: 10/24/22: Strength < 2 based on limited ROM (formal MMT deferred due to early post-op status).    12/21/22: Met for shoulder ER and elbow flexion; otherwise, remaining weakness less than 4+/5 (see chart above).    02/06/23: Met for ER and elbow flexion.   03/08/23: Met for ER/IR, and elbow flexion; not met for shoulder flexion and ABD.  Goal status: ON-GOING     PLAN: PT FREQUENCY: 1x/week   PT DURATION: 4 weeks   PLANNED INTERVENTIONS: Therapeutic exercises, Therapeutic activity, Neuromuscular re-education,  Patient/Family education, Self Care, Joint mobilization, Joint manipulation, Dry Needling, Electrical stimulation, Cryotherapy, Moist heat, Manual therapy, and Re-evaluation.   PLAN FOR NEXT SESSION: Focus on restoration of shoulder ROM, progressive strengthening for long-lever flexion of shoulder and ability to perform compound lifting and reaching patterns, progressive stabilization work. Focus on progressing HEP and limiting number of PT visits. Pt will need to initiate new POC focused on low back once she is able to continue with home-based program for her shoulder.    11:14 AM, 03/13/23 Consuela Mimes, PT, DPT [phone]

## 2023-03-14 ENCOUNTER — Encounter (INDEPENDENT_AMBULATORY_CARE_PROVIDER_SITE_OTHER): Payer: Self-pay | Admitting: *Deleted

## 2023-03-15 ENCOUNTER — Encounter: Payer: Medicaid Other | Admitting: Physical Therapy

## 2023-03-20 ENCOUNTER — Ambulatory Visit: Payer: Medicaid Other | Admitting: Physical Therapy

## 2023-03-20 NOTE — Therapy (Deleted)
OUTPATIENT PHYSICAL THERAPY TREATMENT    Patient Name: Anita Mendoza MRN: [id number] DOB:[birth year], 52 y.o., female Today's Date: 03/20/2023   END OF SESSION:     Past Medical History:  Diagnosis Date   Asthma 2014   Hypertension    Past Surgical History:  Procedure Laterality Date   ABDOMINAL HYSTERECTOMY     carpul tunnel Bilateral 2011   choleycystectomy N/A 2000   ROTATOR CUFF REPAIR Left 2012   TONSILLECTOMY N/A 1979   There are no problems to display for this patient.  PCP:  No PCP on file  REFERRING PROVIDER: Gayleen Orem, MD   REFERRING DIAG: M25.511 (ICD-10-CM) - Acute pain of right shoulder   THERAPY DIAG:  Acute pain of right shoulder  Stiffness of right shoulder, not elsewhere classified  Muscle weakness (generalized)  Rationale for Evaluation and Treatment Rehabilitation  PERTINENT HISTORY: Pt is a 52 year old female s/p Right shoulder diagnostic arthroscopy, debridement, cyst excision along upper border of subscapularis, and open biceps tenodesis.  Patient reports notable post-operative pain - she reports having pain at night at this time. She reports using Gabapentin for pain control; she is avoiding use of oxycodone. Pt has cold compression machine to use at home. Patient reports notable pain/swelling along R upper trapezius region. Pt reports noticing bruising along top of arm or along distal anterior arm after getting rid of her abduction pillow. Pt reports history of numbness from head of radius down to her hand - this has not been apparent following her surgery.    PAIN: 7/10 Rt upper trap area     PRECAUTIONS: None   WEIGHT BEARING RESTRICTIONS: Yes NWB RUE   FALLS: Has patient fallen in last 6 months? 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     SUBJECTIVE:                                                                                                                                                                                      SUBJECTIVE STATEMENT:  Pt reports notable improvement in shoulder pain over the previous week. She reports good response to DN last visit. She reports faith and prayer contributing significantly to her improvement. She reports tolerating self-care activities and reaching better. Pt reports completing supine shoulder flexion with Dbell with  1-lb weight up to 3 sets of 15 and tolerating this well. Pt reports compliance with HEP otherwise.   PAIN:  Are you having pain? Minimal R arm/shoulder pain at arrival     OBJECTIVE (objective measures completed at initial evaluation unless otherwise dated)       Patient Surveys  FOTO: 4, predicted outcome score of 60   Cognition Patient is oriented to person, place, and time.  Recent memory is intact.  Remote memory is intact.  Attention span and concentration are intact.  Expressive speech is intact.  Patient's fund of knowledge is within normal limits for educational level.                          Gross Musculoskeletal Assessment Tremor: None Bulk: Normal Tone: Normal     Posture Self-selected rounded shoulder posture, inc thoracic kyphosis; anteriorly tilted scapulae     PROM              PROM (Normal range in degrees) PROM 10/24/2022 PROM 12/21/22 PROM 02/06/23    Right Left Right Left Right Left  Shoulder            Flexion 120 deg   153   155 172  Extension            Abduction     113   152 WNL  External Rotation 30 deg   83   82 WNL  Internal Rotation 45 deg   70   WNL WNL  Hands Behind Head            Hands Behind Back                         Elbow             Flexion WNL   WNL      Extension WNL   WNL      Pronation WNL   WNL      Supination WNL   WNL      (* = pain; Blank rows = not tested)     Shoulder AROM 12/21/22 Flexion: R 131, L 150 Abduction: R 106, L 167 ER: R 75, L 95  02/06/23 Flexion: R 141, L 152 Abduction: R 138, L 162 ER (arm at side): R 86, L 93 ER (at 90/90): R 69, L 89    03/08/23 Flexion: R 139, L 152 Abduction: R 139, L 162 ER (arm at side): R 88, L 93 ER (at 90/90): R 70, L 89 Functional ER: R T2, L T5 Functional IR: R T12, L T9    LE MMT: MMT (out of 5) Right 10/24/2022 Left 10/24/2022 Right 12/21/22 Left 12/21/22 Right 02/06/23 Left 02/06/23 Right 03/08/23 Left 03/08/23  Shoulder           Flexion     3+* 4 3+ 4 4-* 4+  Extension              Abduction     3+* 4 4-* 4 4-* 4*  External rotation     4+ 4+ 4+ 4+ 4+ 4  Internal rotation     4- 4+ 4* 4 4+ 4  Horizontal abduction              Horizontal adduction              Lower Trapezius  Rhomboids                             Elbow          Flexion     4+ 4+ 4+ 4+ 4+ 4+  Extension     4- 4+ 4 4+ 4 5  Pronation              Supination                             (* = pain; Blank rows = not tested)   Sensation Grossly intact to light touch bilateral UE as determined by testing dermatomes C2-T2. Proprioception and hot/cold testing deferred on this date.   Reflexes Deferred   Palpation Location LEFT  RIGHT           Subocciptials      Cervical paraspinals      Upper Trapezius   1  Levator Scapulae   1  Rhomboid Major/Minor      Sternoclavicular joint   0  Acromioclavicular joint   1  Coracoid process   2  Long head of biceps   2  Supraspinatus   2  Infraspinatus      Subscapularis      Teres Minor      Teres Major      Pectoralis Major      Pectoralis Minor      Anterior Deltoid   1  Lateral Deltoid   1  Posterior Deltoid   1  Latissimus Dorsi      Sternocleidomastoid      (Blank rows = not tested) Graded on 0-4  scale (0 = no pain, 1 = pain, 2 = pain with wincing/grimacing/flinching, 3 = pain with withdrawal, 4 = unwilling to allow palpation), (Blank rows = not tested)        TODAY'S TREATMENT 03/20/23    Manual Therapy - for shoulder ROM, prevention of shoulder stiffness post-operatively   R shoulder PROM into flexion, abduction, and ER/IR within pt tolerance with gentle overpressure as tolerated; x 5 minutes   *not today* STM R anterior and middle deltoid, pec major; x 5 minutes Glenohumeral joint mobilization, inferior and A-P, gr I-II for pain control; 2 x 30 sec each    Therapeutic Exercise  -AA/ROM on UBE x 4 minutes, 2 fwd, 2 back    -Rhythmic stabilization, shoulder at 100 deg flexion in supine; 2 x 1 minutes  -Standing shoulder flexion AROM, in standing; 2x10  -Wall ball stabilization; 2 x 20 CW/CCW, 2.2-lb ball   -Serratus slide with foam roller; 2x8  -L shoulder ER  Red Tband; 2x10  -L shoulder flexion reactive isometric, forward walkout with Red Tband; 2x10 fwd/bwd  -L shoulder bilateral Tband row; Blue Tband; 2x15, 3 sec hold  Relatively easy for patient    PATIENT EDUCATION: HEP reviewed. We discussed ongoing progression of LHB strengthening as tolerated and discussed continued POC.     *not today* -Wall ABCs; A-Z capital letters -Wand AAROM for external rotation at 90 deg; reviewed -Standing wand flexion AAROM; 1x20 -Wall slide, flexion and abduction; x10, 5 sec hold  -Median nerve glide, 1 x 10, 1 sec hold, reviewed for HEP -Ulnar nerve glide; 1 x 10, 1 sec hold, reviewed for HEP        PATIENT EDUCATION:  Education details: see above for patient education details  Person educated: Patient and Spouse Education method: Explanation, Demonstration, and Handouts Education comprehension: verbalized understanding and returned demonstration     HOME EXERCISE PROGRAM:  Access Code: [email or web address] URL: [email or web address] Date:  01/23/2023 Prepared by: Consuela Mimes  Exercises - Shoulder Flexion Wall Slide with Towel  - 2 x daily - 7 x weekly - 2 sets - 10 reps - Standing Shoulder Abduction Wall Slide with Thumb Out  - 2 x daily - 7 x weekly - 2 sets - 10 reps - Seated Upper Trapezius Stretch  - 2 x daily - 7 x weekly - 3 sets - 30sec hold - Supine Shoulder Flexion Extension Full Range AROM  - 2 x daily - 7 x weekly - 1-2 sets - 10 reps - Supine Shoulder External Rotation with Dowel  - 2 x daily - 7 x weekly - 2 sets - 10 reps - Ulnar Nerve Flossing  - 2 x daily - 7 x weekly - 2 sets - 10 reps - 1sec hold - Median Nerve Flossing - Tray  - 2 x daily - 7 x weekly - 2 sets - 10 reps - 1sec hold    ASSESSMENT:   CLINICAL IMPRESSION:  Patient demonstrates ongoing AROM deficit in standing largely attributed to weakness with long-arm shoulder flexion. She has ongoing limitation with reaching and lifting any significant weight with R arm. Pt is experiencing comorbid L shoulder pain due to her L arm having to complete most activities with ongoing R arm functional deficits. Patient's post-op rehab has been prolonged due to persistent pain and difficulty with activity tolerance. Pain is fortunately minimal for R shoulder/arm at this time. Pt is continuing follow-up with referring physician and may undergo hydrodilation pending diagnostic ultrasound for her R shoulder. Patient has remaining deficits in R shoulder active and passive ROM, mobility, strength, and pain. Patient will benefit from continued skilled therapeutic intervention to address the above deficits as needed for improved function and QoL.     OBJECTIVE IMPAIRMENTS: decreased ROM, decreased strength, hypomobility, increased edema, impaired UE functional use, postural dysfunction, and pain.    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    REHAB POTENTIAL: Excellent   CLINICAL DECISION MAKING: Evolving/moderate complexity   EVALUATION COMPLEXITY: Moderate     GOALS: Goals reviewed with patient? Yes   SHORT TERM GOALS: Target date: 11/09/2022   Pt will be independent with HEP to improve mobility and decrease pain to improve pain-free function at home Baseline: 10/24/22: Baseline HEP reviewed and demonstrated.   12/21/22: Pt is compliant with HEP  Goal status: ACHIEVED     LONG TERM GOALS: Target date: 01/18/2023   Pt will increase FOTO to at least 60 to demonstrate significant improvement in function at home and work related to neck pain  Baseline: 10/24/22: 42.    12/21/22: 47/60.   02/06/23: 45/60.   03/08/23: 44/60. Goal status: IN PROGRESS   2.  Pt will decrease worst shoulder pain to no more than 2-3/10 on the NPRS in order to demonstrate clinically significant reduction in shoulder pain. Baseline: 10/24/22: 10/10 pain at worst.     12/21/22: 10/10 at worst.    02/06/23: 8/10 at worst in AM.   03/08/23: 7/10 at worst over previous week, 4/10 at worst this week.  Goal status: IN PROGRESS    3.  Pt will have R shoulder AROM within at least 10 degrees of contralateral upper limb without reproduction of shoulder pain as needed for reaching, self-care ADLs, and household chore performance Baseline: 10/24/22: Decreased PROM, no AROM testing due to current post-op timeline.    12/21/22: All motions improved, not within 10 deg of contralateral upper limb.  02/06/23: Met for ER with arm at side, in progress for flexion and abduction.    03/08/23: Not yet met for flexion, abduction, or ER at 90/90.  Goal status: NOT MET   4. Pt will increase strength for R shoulder to at least 4+/5 or greater grade in order to demonstrate improvement in strength and function as needed for  lifting and carrying task Baseline: 10/24/22: Strength < 2 based on limited ROM (formal MMT deferred due to early post-op status).    12/21/22: Met for shoulder ER and elbow flexion; otherwise, remaining weakness less than 4+/5 (see chart above).    02/06/23: Met for ER and elbow flexion.   03/08/23: Met for ER/IR, and elbow flexion; not met for shoulder flexion and ABD.  Goal status: ON-GOING     PLAN: PT FREQUENCY: 1x/week   PT DURATION: 4 weeks   PLANNED INTERVENTIONS: Therapeutic exercises, Therapeutic activity, Neuromuscular re-education,  Patient/Family education, Self Care, Joint mobilization, Joint manipulation, Dry Needling, Electrical stimulation, Cryotherapy, Moist heat, Manual therapy, and Re-evaluation.   PLAN FOR NEXT SESSION: Focus on restoration of shoulder ROM, progressive strengthening for long-lever flexion of shoulder and ability to perform compound lifting and reaching patterns, progressive stabilization work. Focus on progressing HEP and limiting number of PT visits. Pt will need to initiate new POC focused on low back once she is able to continue with home-based program for her shoulder.    10:09 AM, 03/20/23 Consuela Mimes, PT, DPT [phone]

## 2023-03-22 ENCOUNTER — Encounter: Payer: Medicaid Other | Admitting: Physical Therapy

## 2023-03-27 ENCOUNTER — Encounter: Payer: Medicaid Other | Admitting: Physical Therapy

## 2023-03-29 ENCOUNTER — Encounter: Payer: Medicaid Other | Admitting: Physical Therapy

## 2023-05-01 ENCOUNTER — Ambulatory Visit: Payer: Medicaid Other | Attending: Orthopedic Surgery | Admitting: Physical Therapy

## 2023-05-01 ENCOUNTER — Encounter: Payer: Self-pay | Admitting: Physical Therapy

## 2023-05-01 DIAGNOSIS — M6281 Muscle weakness (generalized): Secondary | ICD-10-CM | POA: Diagnosis present

## 2023-05-01 DIAGNOSIS — M25511 Pain in right shoulder: Secondary | ICD-10-CM | POA: Insufficient documentation

## 2023-05-01 DIAGNOSIS — M25611 Stiffness of right shoulder, not elsewhere classified: Secondary | ICD-10-CM | POA: Diagnosis present

## 2023-05-01 NOTE — Therapy (Unsigned)
OUTPATIENT PHYSICAL THERAPY SHOULDER/ELBOW EVALUATION  Patient Name: Anita Mendoza MRN: [id number] DOB:[birth year], 52 y.o., female Today's Date: 05/01/2023  END OF SESSION:   Past Medical History:  Diagnosis Date   Asthma 2014   Hypertension    Past Surgical History:  Procedure Laterality Date   ABDOMINAL HYSTERECTOMY     carpul tunnel Bilateral 2011   choleycystectomy N/A 2000   ROTATOR CUFF REPAIR Left 2012   TONSILLECTOMY N/A 1979   There are no problems to display for this patient.   PCP: Laqueta Due, MD  REFERRING PROVIDER: Maurice March. Alveria Apley, MD  REFERRING DIAG: [phone] (ICD-10-CM) - Other specified postprocedural states   RATIONALE FOR EVALUATION AND TREATMENT: Rehabilitation  THERAPY DIAG: No diagnosis found.  ONSET DATE: s/p Right shoulder diagnostic arthroscopy, debridement, cyst excision along upper border of subscapularis, and open biceps tenodesis 10/13/22  FOLLOW-UP APPT SCHEDULED WITH REFERRING PROVIDER: Yes , ####   SUBJECTIVE:                                                                                                                                                                                         SUBJECTIVE STATEMENT:  Pt is a 52 year old female s/p Right shoulder diagnostic arthroscopy, debridement, cyst excision along upper border of subscapularis, and open biceps tenodesis 10/13/22. S/p injection 04/06/23 R subacromial bursa.   PERTINENT HISTORY: Pt is a 52 year old female s/p Right shoulder diagnostic arthroscopy, debridement, cyst excision along upper border of subscapularis, and open biceps tenodesis 10/13/22. S/p injection 04/06/23 R subacromial bursa.   Patient currently reports that injection notably helped with her pain. Pt reports limited use of her R hand at this time. Patient reports activity limitations with dressing at this time due to R shoulder mobility deficits and pain.    PAIN:  Pain Intensity: Present: 0/10,  Best: 0/10, Worst: /10 Pain location: anterior arm down bicipital groove, anterior/middle deltoid region  Pain Quality:  "somebody pulling/stretching shoulder"   Radiating: Yes , to R upper arm  Numbness/Tingling: No Focal Weakness: Yes; difficulty gripping/holding items  Aggravating factors: elevate/move arm overhead, driving/turning steering wheel Relieving factors: hand in lap/arm in guarded position, minimal use 24-hour pain behavior: None History of prior shoulder or neck/shoulder injury, pain, surgery, or therapy: Yes, R shoulder arthroscopy 10/13/22; Hx of neck pain, but no treatment/procedures or specific injuries or neck Sx  Falls: Has patient fallen in last 6 months? No, Number of falls: N/A Dominant hand: right  Imaging: Yes ; ultrasound demonstrating intact RTC on 04/06/23.   Red flags (personal history of cancer, chills/fever, night sweats, nausea, vomiting, unrelenting pain, unexplained weight gain/loss): Negative  PRECAUTIONS: None  WEIGHT  BEARING RESTRICTIONS: No  FALLS: Has patient fallen in last 6 months? 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  Recent memory is intact.  Remote memory is intact.  Attention span and concentration are intact.  Expressive speech is intact.  Patient's fund of knowledge is within normal limits for educational level.    Gross Musculoskeletal Assessment Tremor: None Bulk: Normal Tone: Normal   Posture Inc thoracic kyphosis, rounded scapulae and internally rotated shoulders  Cervical  Screen AROM: WFL and painless with overpressure in all planes Spurlings A (ipsilateral lateral flexion/axial compression): R: {NEGATIVE/POSITIVE WGN:56213} L: {NEGATIVE/POSITIVE YQM:57846} Spurlings B (ipsilateral lateral flexion/contralateral rotation/axial compression): R: {NEGATIVE/POSITIVE NGE:95284} L: {NEGATIVE/POSITIVE XLK:44010} Repeated movement: No centralization or peripheralization with protraction or retraction Hoffman Sign (cervical cord compression): R: {NEGATIVE/POSITIVE UVO:53664} L: {NEGATIVE/POSITIVE QIH:47425} ULTT Median: R: {NEGATIVE/POSITIVE ZDG:38756} L: {NEGATIVE/POSITIVE EPP:29518} ULTT Ulnar: R: {NEGATIVE/POSITIVE ACZ:66063} L: {NEGATIVE/POSITIVE KZS:01093} ULTT Radial: R: {NEGATIVE/POSITIVE ATF:57322} L: {NEGATIVE/POSITIVE GUR:42706}  AROM AROM (Normal range in degrees) AROM  Cervical  Flexion (50) 40  Extension (80) 50  Right lateral flexion (45) 22 "tight"   Left lateral flexion (45) 27 "tight"  Right rotation (85) 45  Left rotation (85) 66   Right Left  Shoulder 78 90*  Flexion    Extension    Abduction 106 30*  External Rotation 88 (arm at side) 82 (arm at side)  Internal Rotation    Hands Behind Head C7 T4  Hands Behind Back L1 L1      Elbow    Flexion    Extension    Pronation    Supination    (* = pain; Blank rows = not tested)  UE MMT: MMT (out of 5) Right Left       Shoulder   Flexion 4* 4*  Extension    Abduction 4* 3+*  External rotation 4+ 4+  Internal rotation 4* 4*  Horizontal abduction    Horizontal adduction    Lower Trapezius    Rhomboids        Elbow  Flexion 5 4*  Extension 4* 5  Pronation    Supination        Wrist  Flexion    Extension    Radial deviation    Ulnar deviation        MCP  Flexion    Extension    Abduction    Adduction    (* = pain; Blank rows = not tested)  Sensation Deferred  Reflexes Deferred  Palpation Location LEFT  RIGHT           Subocciptials    Cervical paraspinals     Upper Trapezius    Levator Scapulae    Rhomboid Major/Minor    Sternoclavicular joint    Acromioclavicular joint    Coracoid process    Long head of biceps    Supraspinatus    Infraspinatus    Subscapularis    Teres Minor    Teres Major    Pectoralis Major    Pectoralis Minor    Anterior Deltoid    Lateral Deltoid  Posterior Deltoid    Latissimus Dorsi    Sternocleidomastoid    (Blank rows = not tested) Graded on 0-4 scale (0 = no pain, 1 = pain, 2 = pain with wincing/grimacing/flinching, 3 = pain with withdrawal, 4 = unwilling to allow palpation), (Blank rows = not tested)   Accessory Motions/Glides Glenohumeral: Posterior: R: {normal/abnormal/not examined:14677} L: {normal/abnormal/not examined:14677} Inferior: R: {normal/abnormal/not examined:14677} L: {normal/abnormal/not examined:14677} Anterior: R: {normal/abnormal/not examined:14677} L: {normal/abnormal/not examined:14677}  Acromioclavicular:  Posterior: R: {normal/abnormal/not examined:14677} L: {normal/abnormal/not examined:14677} Anterior: R: {normal/abnormal/not examined:14677} L: {normal/abnormal/not examined:14677}  Sternoclavicular: Posterior: R: {normal/abnormal/not examined:14677} L: {normal/abnormal/not examined:14677} Anterior: R: {normal/abnormal/not examined:14677} L: {normal/abnormal/not examined:14677} Superior: R: {normal/abnormal/not examined:14677} L: {normal/abnormal/not examined:14677} Inferior: R: {normal/abnormal/not examined:14677} L: {normal/abnormal/not examined:14677}  Scapulothoracic: Distraction: R: {normal/abnormal/not examined:14677} L: {normal/abnormal/not examined:14677} Medial: R: {normal/abnormal/not examined:14677} L: {normal/abnormal/not examined:14677} Lateral: R: {normal/abnormal/not examined:14677} L: {normal/abnormal/not examined:14677} Inferior: R: {normal/abnormal/not examined:14677} L: {normal/abnormal/not examined:14677} Superior: R: {normal/abnormal/not  examined:14677} L: {normal/abnormal/not examined:14677} Upward Rotation: R: {normal/abnormal/not examined:14677} L: {normal/abnormal/not examined:14677} Downward Rotation: R: {normal/abnormal/not examined:14677} L: {normal/abnormal/not examined:14677}  Muscle Length Testing Pectoralis Major: R: {normal/abnormal/not examined:14677} L: {normal/abnormal/not examined:14677} Pectoralis Minor: R: {normal/abnormal/not examined:14677} L: {normal/abnormal/not examined:14677} Biceps: R: {normal/abnormal/not examined:14677} L: {normal/abnormal/not examined:14677}  SPECIAL TESTS Rotator Cuff  Drop Arm Test: {NEGATIVE/POSITIVE NGE:95284} Painful Arc (Pain from 60 to 120 degrees scaption): {NEGATIVE/POSITIVE XLK:44010} Infraspinatus Muscle Test: {NEGATIVE/POSITIVE UVO:53664} If all 3 tests positive, the probability of a full-thickness rotator cuff tear is 91%  Subacromial Impingement Hawkins-Kennedy: {NEGATIVE/POSITIVE QIH:47425} Neer (Block scapula, PROM flexion): {NEGATIVE/POSITIVE ZDG:38756} Painful Arc (Pain from 60 to 120 degrees scaption): {NEGATIVE/POSITIVE EPP:29518} Empty Can: {NEGATIVE/POSITIVE ACZ:66063} External Rotation Resistance: {NEGATIVE/POSITIVE KZS:01093} Horizontal Adduction: {NEGATIVE/POSITIVE ATF:57322} Scapular Assist: {NEGATIVE/POSITIVE GUR:42706} Positive Hawkins-Kennedy, Painful arc sign, Infraspinatus muscle test then +LR: 10.56 of some type of impingement present, 2/3 tests: +LR 5.06, -LR 0.17, Positive 3/5 Hawkins-Kennedy, neer, painful arc, empty can, and external rotation resistance then SN: .75 (.54-.96) SP: .74 (.61-.88) +LR: 2.93 (1.60-5.36) -LR: .34 (.14-.80)  Labral Tear Biceps Load II (120 elevation, full ER, 90 elbow flexion, full supination, resisted elbow flexion): {NEGATIVE/POSITIVE CBJ:62831} Crank (160 scaption, axial load with IR/ER): {NEGATIVE/POSITIVE DVV:61607} O'Briens/Active Compression Test (90 shoulder flexion, 10 adduction, full IR):  {NEGATIVE/POSITIVE PXT:06269}  Bicep Tendon Pathology Speed (shoulder flexion to 90, external rotation, full elbow extension, and forearm supination with resistance: {NEGATIVE/POSITIVE SWN:46270} Yergason's (resisted shoulder ER and supination/biceps tendon pathology): {NEGATIVE/POSITIVE JJK:09381}  Shoulder Instability Sulcus Sign: {NEGATIVE/POSITIVE WEX:93716} Anterior Apprehension: {NEGATIVE/POSITIVE RCV:89381}  Beighton scale  LEFT  RIGHT           1. Passive dorsiflexion and hyperextension of the fifth MCP joint beyond 90  0 0   2. Passive apposition of the thumb to the flexor aspect of the forearm  0  0   3. Passive hyperextension of the elbow beyond 10  0  0   4. Passive hyperextension of the knee beyond 10  0  0   5. Active forward flexion of the trunk with the knees fully extended so that the palms of the hands rest flat on the floor   0   TOTAL         0/ 9    Outcome Measures Quick DASH:  SPADI:    TODAY'S TREATMENT  ***   PATIENT EDUCATION:  Education details: *** Person educated: {Person educated:25204} Education method: {Education Method:25205} Education comprehension: {Education Comprehension:25206}   HOME EXERCISE PROGRAM:    ASSESSMENT:  CLINICAL IMPRESSION: Patient is a *** y.o. *** who was seen today for  physical therapy evaluation and treatment for ***.   OBJECTIVE IMPAIRMENTS: {opptimpairments:25111}.   ACTIVITY LIMITATIONS: {activitylimitations:27494}  PARTICIPATION LIMITATIONS: {participationrestrictions:25113}  PERSONAL FACTORS: {Personal factors:25162} are also affecting patient's functional outcome.   REHAB POTENTIAL: {rehabpotential:25112}  CLINICAL DECISION MAKING: {clinical decision making:25114}  EVALUATION COMPLEXITY: {Evaluation complexity:25115}   GOALS: Goals reviewed with patient? Yes  SHORT TERM GOALS: Target date: {follow up:25551}  Pt will be independent with HEP to improve strength and decrease neck pain to  improve pain-free function at home and work. Baseline: *** Goal status: INITIAL   LONG TERM GOALS: Target date: {follow up:25551}  Pt will increase FOTO to at least *** to demonstrate significant improvement in function at home and work related to neck pain  Baseline:  Goal status: INITIAL  2.  Pt will decrease worst shoulder pain by at least 3 points on the NPRS in order to demonstrate clinically significant reduction in shoulder pain. Baseline: *** Goal status: INITIAL  3.  Pt will decrease quick DASH score by at least 8% in order to demonstrate clinically significant reduction in disability related to shoulder pain        Baseline: *** Goal status: INITIAL  4. Pt will increase strength by at least 1/2 MMT grade in order to demonstrate improvement in strength and function         Baseline: *** Goal status: INITIAL   PLAN: PT FREQUENCY: 1-2x/week  PT DURATION: {rehab duration:25117}  PLANNED INTERVENTIONS: Therapeutic exercises, Therapeutic activity, Neuromuscular re-education, Balance training, Gait training, Patient/Family education, Self Care, Joint mobilization, Joint manipulation, Vestibular training, Canalith repositioning, Orthotic/Fit training, DME instructions, Dry Needling, Electrical stimulation, Spinal manipulation, Spinal mobilization, Cryotherapy, Moist heat, Taping, Traction, Ultrasound, Ionotophoresis 4mg /ml Dexamethasone, Manual therapy, and Re-evaluation.  PLAN FOR NEXT SESSION: ***   Consuela Mimes, PT, DPT [phone]  Gertie Exon, PT 05/01/2023, 7:59 AM

## 2023-05-08 ENCOUNTER — Ambulatory Visit: Payer: Medicaid Other | Admitting: Physical Therapy

## 2023-05-08 DIAGNOSIS — M25511 Pain in right shoulder: Secondary | ICD-10-CM

## 2023-05-08 DIAGNOSIS — M25611 Stiffness of right shoulder, not elsewhere classified: Secondary | ICD-10-CM

## 2023-05-08 DIAGNOSIS — M6281 Muscle weakness (generalized): Secondary | ICD-10-CM

## 2023-05-08 NOTE — Therapy (Unsigned)
OUTPATIENT PHYSICAL THERAPY TREATMENT  Patient Name: Anita Mendoza MRN: [id number] DOB:1971/06/12, 52 y.o., female Today's Date: 05/08/2023'  END OF SESSION:  PT End of Session - 05/08/23 1030     Visit Number 2    Date for PT Re-Evaluation 06/14/23    Authorization Type [employer] 2024    PT Start Time 1030    PT Stop Time 1111    PT Time Calculation (min) 41 min    Activity Tolerance Patient tolerated treatment well    Behavior During Therapy [facility] for tasks assessed/performed              Past Medical History:  Diagnosis Date   Asthma 2014   Hypertension    Past Surgical History:  Procedure Laterality Date   ABDOMINAL HYSTERECTOMY     carpul tunnel Bilateral 2011   choleycystectomy N/A 2000   ROTATOR CUFF REPAIR Left 2012   TONSILLECTOMY N/A 1979   Patient Active Problem List   Diagnosis Date Noted   Pain in right shoulder 05/01/2023    PCP: Laqueta Due, MD  REFERRING PROVIDER: Maurice March. Alveria Apley, MD  REFERRING DIAG: [phone] (ICD-10-CM) - Other specified postprocedural states   RATIONALE FOR EVALUATION AND TREATMENT: Rehabilitation  THERAPY DIAG: Right shoulder pain, unspecified chronicity  Stiffness of right shoulder, not elsewhere classified  Muscle weakness (generalized)  Acute pain of right shoulder  ONSET DATE: s/p Right shoulder diagnostic arthroscopy, debridement, cyst excision along upper border of subscapularis, and open biceps tenodesis 10/13/22  FOLLOW-UP APPT SCHEDULED WITH REFERRING PROVIDER: Yes , ####  PERTINENT HISTORY: Pt is a 52 year old female s/p Right shoulder diagnostic arthroscopy, debridement, cyst excision along upper border of subscapularis, and open biceps tenodesis 10/13/22. S/p injection 04/06/23 R subacromial bursa. Pt had difficulty making progress with post-op rehab due to persistent R upper quarter pain in spite of healing timeline following arthroscopy and use of dry needling, modalities, and manual  therapy. We discussed following up with MD for ongoing issues inconsistent with post-operative timeframe. Pt reports improvement in pain with injection on 8/22, but she has ongoing weakness and limited RUE functional use (e.g. reaching/grasping, dressing, overhead work, household chores). Patient reports activity limitations in particular with dressing at this time due to R shoulder mobility deficits and pain.    PAIN:  Pain Intensity: Present: 0/10, Best: 0/10, Worst: 8/10 Pain location: anterior arm down bicipital groove, anterior/middle deltoid region  Pain Quality:  "somebody pulling/stretching shoulder"   Radiating: Yes , to R upper arm  Numbness/Tingling: No Focal Weakness: Yes; difficulty gripping/holding items  Aggravating factors: elevate/move arm overhead, driving/turning steering wheel Relieving factors: hand in lap/arm in guarded position, minimal use 24-hour pain behavior: None History of prior shoulder or neck/shoulder injury, pain, surgery, or therapy: Yes, R shoulder arthroscopy 10/13/22; Hx of neck pain, but no treatment/procedures or specific injuries or neck Sx  Falls: Has patient fallen in last 6 months? No, Number of falls: N/A Dominant hand: right  Imaging: Yes ; ultrasound demonstrating intact RTC on 04/06/23.   Red flags (personal history of cancer, chills/fever, night sweats, nausea, vomiting, unrelenting pain, unexplained weight gain/loss): Negative  PRECAUTIONS: None  WEIGHT BEARING RESTRICTIONS: No  FALLS: Has patient fallen in last 6 months? 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   Hobbies: Museum/gallery curator, taking care of dogs, chickens, pigs   Patient Goals: Restore  strength of R upper limb     OBJECTIVE (data from initial evaluation unless otherwise dated):     Patient Surveys  FOTO: 28, predicted outcome score of 61    Posture Inc thoracic kyphosis, rounded scapulae and internally rotated shoulders  Cervical Screen AROM: WFL and painless with overpressure in all planes Spurlings A (ipsilateral lateral flexion/axial compression): R: Negative L: Negative   AROM AROM (Normal range in degrees) AROM  Cervical  Flexion (50) 40  Extension (80) 50  Right lateral flexion (45) 22 "tight"   Left lateral flexion (45) 27 "tight"  Right rotation (85) 45  Left rotation (85) 66   Right Left  Shoulder 78 90*  Flexion    Extension    Abduction 106 30*  External Rotation 88 (arm at side) 82 (arm at side)  Internal Rotation    Hands Behind Head C7 T4  Hands Behind Back L1 L1      (* = pain; Blank rows = not tested)  UE MMT: MMT (out of 5) Right Left       Shoulder   Flexion 4* 4*  Extension    Abduction 4* 3+*  External rotation 4+ 4+  Internal rotation 4* 4*  Horizontal abduction    Horizontal adduction    Lower Trapezius    Rhomboids        Elbow  Flexion 5 4*  Extension 4* 5  Pronation    Supination        (* = pain; Blank rows = not tested)   Palpation Location LEFT  RIGHT           Subocciptials    Cervical paraspinals  1  Upper Trapezius  1  Levator Scapulae  1  Rhomboid Major/Minor    Sternoclavicular joint    Acromioclavicular joint    Coracoid process    Long head of biceps  1  Supraspinatus  1  Infraspinatus    Subscapularis    Teres Minor    Teres Major    Pectoralis Major    Pectoralis Minor    Anterior Deltoid  1  Lateral Deltoid  1  Posterior Deltoid    Latissimus Dorsi    Sternocleidomastoid    (Blank rows = not tested) Graded on 0-4 scale (0 = no pain, 1 = pain, 2 = pain with wincing/grimacing/flinching, 3 = pain with withdrawal, 4 = unwilling to allow palpation), (Blank rows = not tested)     TODAY'S TREATMENT    SUBJECTIVE STATEMENT:  Patient reports significant comorbid low back  pain that is axial; she's had pain down her leg before, but not as much at this time. Patient reports undergoing hydrodilation on 05/04/23.     Manual Therapy - for symptom modulation, soft tissue sensitivity and mobility, joint mobility, ROM   Gentle R shoulder PROM within pt tolerance; x 3 minutes Glenohumeral joint mobilization; gr II; 2 x 30 sec for posterior and inferior directions    Therapeutic Exercise - for improved soft tissue flexibility and extensibility as needed for ROM, improved strength as needed to improve performance of CKC activities/functional movements  Reclined shoulder flexion AAROM with wand; Reclined shoulder flexion AROM, RUE;  Shoulder adduction with Tband, ABD AAROM (Red Tband); 2x10  Serratus slide with foam roller, to access overhead ROM and for serratus anterior/periscapular mm re-training; 2x8  Standing row with Blue Tband; 2x10  PATIENT EDUCATION: HEP update and review.    PATIENT EDUCATION:  Education details: see above for patient  education details Person educated: Patient Education method: Explanation, Demonstration, and Handouts Education comprehension: verbalized understanding and returned demonstration   HOME EXERCISE PROGRAM:  Access Code: [email or web address] URL: [email or web address] Date: 05/08/2023 Prepared by: Consuela Mimes  Exercises - Shoulder Flexion Wall Slide with Towel  - 2 x daily - 7 x weekly - 2 sets - 10 reps - Standing Shoulder Abduction Wall Slide with Thumb Out  - 2 x daily - 7 x weekly - 2 sets - 10 reps - Seated Upper Trapezius Stretch  - 2 x daily - 7 x weekly - 3 sets - 30sec hold - Reclined Shoulder Elevation  - 2 x daily - 7 x weekly - 2 sets - 10 reps - Sidelying Shoulder Abduction Palm Forward  - 2 x daily - 7 x weekly - 2 sets - 10 reps - Shoulder External Rotation with Anchored Resistance  - 1 x daily - 7 x weekly - 2 sets - 10 reps - Shoulder Internal Rotation with Resistance  - 1 x daily - 7 x weekly  - 2 sets - 10 reps - Standing Shoulder Row with Anchored Resistance  - 1 x daily - 7 x weekly - 2 sets - 10 reps - 3sec hold    ASSESSMENT:  CLINICAL IMPRESSION: Patient is a 52 y.o. female who was seen today for physical therapy evaluation and treatment for ongoing R shoulder mobility deficits, impaired RUE functional use, and R shoulder pain that has persisted beyond expected acute post-operative tissue healing timeframes following right shoulder diagnostic arthroscopy, debridement, cyst excision along upper border of subscapularis, and open biceps tenodesis 10/13/22. Pt is s/p R subacromial injection on 04/06/23. Pt unfortunately has been reliant on L upper limb since February and reports recent increasing L shoulder pain due to this. Pt has current deficits in: bilateral shoulder AROM, RTC/deltoid/periscapular strength, postural changes, and local nociceptive shoulder pain bilaterally (current referral for post-op (left) shoulder). Pt will continue to benefit from skilled PT services to address deficits and improve function.   OBJECTIVE IMPAIRMENTS: decreased ROM, decreased strength, hypomobility, impaired flexibility, impaired UE functional use, postural dysfunction, and pain.   ACTIVITY LIMITATIONS: carrying, lifting, dressing, reach over head, hygiene/grooming  PARTICIPATION LIMITATIONS: meal prep, cleaning, laundry, driving, and community activity  PERSONAL FACTORS: Past/current experiences, Time since onset of injury/illness/exacerbation, and 3+ comorbidities: (Asthma, HTN, post-operative status for L shoulder)  are also affecting patient's functional outcome.   REHAB POTENTIAL: Good  CLINICAL DECISION MAKING: Evolving/moderate complexity  EVALUATION COMPLEXITY: Moderate   GOALS: Goals reviewed with patient? Yes  SHORT TERM GOALS: Target date: 05/24/2023  Pt will be independent with HEP to improve strength and decrease neck pain to improve pain-free function at home and  work. Baseline: 05/01/23: HEP updated from established program following recent episode of care.  Goal status: INITIAL   LONG TERM GOALS: Target date: 06/15/2023  Pt will increase FOTO to at least 61 to demonstrate significant improvement in function at home and work related to neck pain  Baseline: 05/01/23: 44 Goal status: INITIAL  2.  Pt will decrease worst shoulder pain by at least 3 points on the NPRS in order to demonstrate clinically significant reduction in shoulder pain. Baseline: 05/01/23: 8/10 pain at worst Goal status: INITIAL  3.  Pt will demonstrate R shoulder AROM to 150 deg for forward elevation and abduction as needed for reaching and self-care ADls   Baseline: 05/01/23: motion loss for flexion, abduction.  Goal status: INITIAL  4. Pt will improve strength of  R shoulder to 4+/5 for all assessed motions indicative of improved strength required for lifting household items     Baseline: 05/01/23: R shoulder flx/abd/IR 4/5 with pain. Goal status: INITIAL   PLAN: PT FREQUENCY: 1x/week  PT DURATION: 6 weeks  PLANNED INTERVENTIONS: Therapeutic exercises, Therapeutic activity, Neuromuscular re-education, Balance training, Gait training, Patient/Family education, Self Care, Joint mobilization, Joint manipulation, Vestibular training, Canalith repositioning, Orthotic/Fit training, DME instructions, Dry Needling, Electrical stimulation, Spinal manipulation, Spinal mobilization, Cryotherapy, Moist heat, Taping, Traction, Ultrasound, Ionotophoresis 4mg /ml Dexamethasone, Manual therapy, and Re-evaluation.  PLAN FOR NEXT SESSION: R shoulder ROM, progressive AROM with pt moving from supine to reclined sitting to standing; progress with RTC and deltoid strengthening.    Consuela Mimes, PT, DPT #[id number]  Gertie Exon, PT 05/08/2023, 11:15 AM

## 2023-05-08 NOTE — Therapy (Deleted)
OUTPATIENT PHYSICAL THERAPY TREATMENT  Patient Name: Anita Mendoza MRN: [id number] DOB:02/12/1971, 52 y.o., female Today's Date: 05/08/2023'  END OF SESSION:    Past Medical History:  Diagnosis Date   Asthma 2014   Hypertension    Past Surgical History:  Procedure Laterality Date   ABDOMINAL HYSTERECTOMY     carpul tunnel Bilateral 2011   choleycystectomy N/A 2000   ROTATOR CUFF REPAIR Left 2012   TONSILLECTOMY N/A 1979   Patient Active Problem List   Diagnosis Date Noted   Pain in right shoulder 05/01/2023    PCP: Laqueta Due, MD  REFERRING PROVIDER: Maurice March. Alveria Apley, MD  REFERRING DIAG: [phone] (ICD-10-CM) - Other specified postprocedural states   RATIONALE FOR EVALUATION AND TREATMENT: Rehabilitation  THERAPY DIAG: Right shoulder pain, unspecified chronicity  Stiffness of right shoulder, not elsewhere classified  Muscle weakness (generalized)  Acute pain of right shoulder  ONSET DATE: s/p Right shoulder diagnostic arthroscopy, debridement, cyst excision along upper border of subscapularis, and open biceps tenodesis 10/13/22  FOLLOW-UP APPT SCHEDULED WITH REFERRING PROVIDER: Yes , ####  PERTINENT HISTORY: Pt is a 53 year old female s/p Right shoulder diagnostic arthroscopy, debridement, cyst excision along upper border of subscapularis, and open biceps tenodesis 10/13/22. S/p injection 04/06/23 R subacromial bursa. Pt had difficulty making progress with post-op rehab due to persistent R upper quarter pain in spite of healing timeline following arthroscopy and use of dry needling, modalities, and manual therapy. We discussed following up with MD for ongoing issues inconsistent with post-operative timeframe. Pt reports improvement in pain with injection on 8/22, but she has ongoing weakness and limited RUE functional use (e.g. reaching/grasping, dressing, overhead work, household chores). Patient reports activity limitations in particular with dressing at  this time due to R shoulder mobility deficits and pain.    PAIN:  Pain Intensity: Present: 0/10, Best: 0/10, Worst: 8/10 Pain location: anterior arm down bicipital groove, anterior/middle deltoid region  Pain Quality:  "somebody pulling/stretching shoulder"   Radiating: Yes , to R upper arm  Numbness/Tingling: No Focal Weakness: Yes; difficulty gripping/holding items  Aggravating factors: elevate/move arm overhead, driving/turning steering wheel Relieving factors: hand in lap/arm in guarded position, minimal use 24-hour pain behavior: None History of prior shoulder or neck/shoulder injury, pain, surgery, or therapy: Yes, R shoulder arthroscopy 10/13/22; Hx of neck pain, but no treatment/procedures or specific injuries or neck Sx  Falls: Has patient fallen in last 6 months? No, Number of falls: N/A Dominant hand: right  Imaging: Yes ; ultrasound demonstrating intact RTC on 04/06/23.   Red flags (personal history of cancer, chills/fever, night sweats, nausea, vomiting, unrelenting pain, unexplained weight gain/loss): Negative  PRECAUTIONS: None  WEIGHT BEARING RESTRICTIONS: No  FALLS: Has patient fallen in last 6 months? 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   Hobbies: Museum/gallery curator, taking care of dogs, chickens, pigs   Patient Goals: Restore strength of R upper limb     OBJECTIVE (data from initial evaluation unless otherwise dated):    Patient Surveys  FOTO: 44, predicted outcome score of 61    Posture Inc thoracic kyphosis, rounded scapulae and internally rotated shoulders  Cervical Screen AROM: WFL and painless with overpressure in all planes Spurlings A (ipsilateral lateral flexion/axial compression): R: Negative L: Negative   AROM AROM (Normal range in degrees) AROM   Cervical  Flexion (50) 40  Extension (  80) 50  Right lateral flexion (45) 22 "tight"   Left lateral flexion (45) 27 "tight"  Right rotation (85) 45  Left rotation (85) 66   Right Left  Shoulder 78 90*  Flexion    Extension    Abduction 106 30*  External Rotation 88 (arm at side) 82 (arm at side)  Internal Rotation    Hands Behind Head C7 T4  Hands Behind Back L1 L1      (* = pain; Blank rows = not tested)  UE MMT: MMT (out of 5) Right Left       Shoulder   Flexion 4* 4*  Extension    Abduction 4* 3+*  External rotation 4+ 4+  Internal rotation 4* 4*  Horizontal abduction    Horizontal adduction    Lower Trapezius    Rhomboids        Elbow  Flexion 5 4*  Extension 4* 5  Pronation    Supination        (* = pain; Blank rows = not tested)   Palpation Location LEFT  RIGHT           Subocciptials    Cervical paraspinals  1  Upper Trapezius  1  Levator Scapulae  1  Rhomboid Major/Minor    Sternoclavicular joint    Acromioclavicular joint    Coracoid process    Long head of biceps  1  Supraspinatus  1  Infraspinatus    Subscapularis    Teres Minor    Teres Major    Pectoralis Major    Pectoralis Minor    Anterior Deltoid  1  Lateral Deltoid  1  Posterior Deltoid    Latissimus Dorsi    Sternocleidomastoid    (Blank rows = not tested) Graded on 0-4 scale (0 = no pain, 1 = pain, 2 = pain with wincing/grimacing/flinching, 3 = pain with withdrawal, 4 = unwilling to allow palpation), (Blank rows = not tested)     TODAY'S TREATMENT    SUBJECTIVE STATEMENT:  Patient reports ***   Manual Therapy - for symptom modulation, soft tissue sensitivity and mobility, joint mobility, ROM   STM/DTM    Therapeutic Exercise - for improved soft tissue flexibility and extensibility as needed for ROM, improved strength as needed to improve performance of CKC activities/functional movements      PATIENT EDUCATION:  Education details: see above for patient  education details Person educated: Patient Education method: Explanation, Demonstration, and Handouts Education comprehension: verbalized understanding and returned demonstration   HOME EXERCISE PROGRAM:  Access Code: [email or web address] URL: [email or web address] Date: 05/03/2023 Prepared by: Consuela Mimes  Exercises - Shoulder Flexion Wall Slide with Towel  - 2 x daily - 7 x weekly - 2 sets - 10 reps - Standing Shoulder Abduction Wall Slide with Thumb Out  - 2 x daily - 7 x weekly - 2 sets - 10 reps - Seated Upper Trapezius Stretch  - 2 x daily - 7 x weekly - 3 sets - 30sec hold - Reclined Shoulder Elevation  - 2 x daily - 7 x weekly - 2 sets - 10 reps - Sidelying Shoulder Abduction Palm Forward  - 2 x daily - 7 x weekly - 2 sets - 10 reps - Shoulder External Rotation with Anchored Resistance  - 1 x daily - 7 x weekly - 2 sets - 10 reps - Shoulder Internal Rotation with Resistance  - 1 x daily - 7 x weekly - 2 sets - 10 reps  ASSESSMENT:  CLINICAL IMPRESSION: Patient is a 52 y.o. female who was seen today for physical therapy evaluation and treatment for ongoing R shoulder mobility deficits, impaired RUE functional use, and R shoulder pain that has persisted beyond expected acute post-operative tissue healing timeframes following right shoulder diagnostic arthroscopy, debridement, cyst excision along upper border of subscapularis, and open biceps tenodesis 10/13/22. Pt is s/p R subacromial injection on 04/06/23. Pt unfortunately has been reliant on L upper limb since February and reports recent increasing L shoulder pain due to this. Pt has current deficits in: bilateral shoulder AROM, RTC/deltoid/periscapular strength, postural changes, and local nociceptive shoulder pain bilaterally (current referral for post-op (left) shoulder). Pt will continue to benefit from skilled PT services to address deficits and improve function.   OBJECTIVE IMPAIRMENTS: decreased ROM, decreased  strength, hypomobility, impaired flexibility, impaired UE functional use, postural dysfunction, and pain.   ACTIVITY LIMITATIONS: carrying, lifting, dressing, reach over head, hygiene/grooming  PARTICIPATION LIMITATIONS: meal prep, cleaning, laundry, driving, and community activity  PERSONAL FACTORS: Past/current experiences, Time since onset of injury/illness/exacerbation, and 3+ comorbidities: (Asthma, HTN, post-operative status for L shoulder)  are also affecting patient's functional outcome.   REHAB POTENTIAL: Good  CLINICAL DECISION MAKING: Evolving/moderate complexity  EVALUATION COMPLEXITY: Moderate   GOALS: Goals reviewed with patient? Yes  SHORT TERM GOALS: Target date: 05/24/2023  Pt will be independent with HEP to improve strength and decrease neck pain to improve pain-free function at home and work. Baseline: 05/01/23: HEP updated from established program following recent episode of care.  Goal status: INITIAL   LONG TERM GOALS: Target date: 06/15/2023  Pt will increase FOTO to at least 61 to demonstrate significant improvement in function at home and work related to neck pain  Baseline: 05/01/23: 44 Goal status: INITIAL  2.  Pt will decrease worst shoulder pain by at least 3 points on the NPRS in order to demonstrate clinically significant reduction in shoulder pain. Baseline: 05/01/23: 8/10 pain at worst Goal status: INITIAL  3.  Pt will demonstrate R shoulder AROM to 150 deg for forward elevation and abduction as needed for reaching and self-care ADls   Baseline: 05/01/23: motion loss for flexion, abduction.  Goal status: INITIAL  4. Pt will improve strength of R shoulder to 4+/5 for all assessed motions indicative of improved strength required for lifting household items     Baseline: 05/01/23: R shoulder flx/abd/IR 4/5 with pain. Goal status: INITIAL   PLAN: PT FREQUENCY: 1x/week  PT DURATION: 6 weeks  PLANNED INTERVENTIONS: Therapeutic exercises,  Therapeutic activity, Neuromuscular re-education, Balance training, Gait training, Patient/Family education, Self Care, Joint mobilization, Joint manipulation, Vestibular training, Canalith repositioning, Orthotic/Fit training, DME instructions, Dry Needling, Electrical stimulation, Spinal manipulation, Spinal mobilization, Cryotherapy, Moist heat, Taping, Traction, Ultrasound, Ionotophoresis 4mg /ml Dexamethasone, Manual therapy, and Re-evaluation.  PLAN FOR NEXT SESSION: R shoulder ROM, progressive AROM with pt moving from supine to reclined sitting to standing; progress with RTC and deltoid strengthening.    Consuela Mimes, PT, DPT #[id number]  Gertie Exon, PT 05/08/2023, 7:35 AM

## 2023-05-09 ENCOUNTER — Encounter: Payer: Self-pay | Admitting: Physical Therapy

## 2023-05-15 ENCOUNTER — Ambulatory Visit: Payer: Medicaid Other | Admitting: Physical Therapy

## 2023-05-15 ENCOUNTER — Encounter: Payer: Self-pay | Admitting: Physical Therapy

## 2023-05-15 DIAGNOSIS — M25511 Pain in right shoulder: Secondary | ICD-10-CM

## 2023-05-15 DIAGNOSIS — M6281 Muscle weakness (generalized): Secondary | ICD-10-CM

## 2023-05-15 DIAGNOSIS — M25611 Stiffness of right shoulder, not elsewhere classified: Secondary | ICD-10-CM

## 2023-05-15 NOTE — Therapy (Unsigned)
OUTPATIENT PHYSICAL THERAPY TREATMENT  Patient Name: Anita Mendoza MRN: [id number] DOB:Dec 17, 1970, 52 y.o., female Today's Date: 05/15/2023  END OF SESSION:  PT End of Session - 05/15/23 1037     Visit Number 3    Date for PT Re-Evaluation 06/14/23    Authorization Type [employer] 2024    PT Start Time 1034    PT Stop Time 1112    PT Time Calculation (min) 38 min    Activity Tolerance Patient tolerated treatment well    Behavior During Therapy [facility] for tasks assessed/performed               Past Medical History:  Diagnosis Date   Asthma 2014   Hypertension    Past Surgical History:  Procedure Laterality Date   ABDOMINAL HYSTERECTOMY     carpul tunnel Bilateral 2011   choleycystectomy N/A 2000   ROTATOR CUFF REPAIR Left 2012   TONSILLECTOMY N/A 1979   Patient Active Problem List   Diagnosis Date Noted   Pain in right shoulder 05/01/2023    PCP: Laqueta Due, MD  REFERRING PROVIDER: Maurice March. Alveria Apley, MD  REFERRING DIAG: [phone] (ICD-10-CM) - Other specified postprocedural states   RATIONALE FOR EVALUATION AND TREATMENT: Rehabilitation  THERAPY DIAG: Right shoulder pain, unspecified chronicity  Stiffness of right shoulder, not elsewhere classified  Muscle weakness (generalized)  Acute pain of right shoulder  ONSET DATE: s/p Right shoulder diagnostic arthroscopy, debridement, cyst excision along upper border of subscapularis, and open biceps tenodesis 10/13/22  FOLLOW-UP APPT SCHEDULED WITH REFERRING PROVIDER: Yes , ####  PERTINENT HISTORY: Pt is a 52 year old female s/p Right shoulder diagnostic arthroscopy, debridement, cyst excision along upper border of subscapularis, and open biceps tenodesis 10/13/22. S/p injection 04/06/23 R subacromial bursa. Pt had difficulty making progress with post-op rehab due to persistent R upper quarter pain in spite of healing timeline following arthroscopy and use of dry needling, modalities, and manual  therapy. We discussed following up with MD for ongoing issues inconsistent with post-operative timeframe. Pt reports improvement in pain with injection on 8/22, but she has ongoing weakness and limited RUE functional use (e.g. reaching/grasping, dressing, overhead work, household chores). Patient reports activity limitations in particular with dressing at this time due to R shoulder mobility deficits and pain.    PAIN:  Pain Intensity: Present: 0/10, Best: 0/10, Worst: 8/10 Pain location: anterior arm down bicipital groove, anterior/middle deltoid region  Pain Quality:  "somebody pulling/stretching shoulder"   Radiating: Yes , to R upper arm  Numbness/Tingling: No Focal Weakness: Yes; difficulty gripping/holding items  Aggravating factors: elevate/move arm overhead, driving/turning steering wheel Relieving factors: hand in lap/arm in guarded position, minimal use 24-hour pain behavior: None History of prior shoulder or neck/shoulder injury, pain, surgery, or therapy: Yes, R shoulder arthroscopy 10/13/22; Hx of neck pain, but no treatment/procedures or specific injuries or neck Sx  Falls: Has patient fallen in last 6 months? No, Number of falls: N/A Dominant hand: right  Imaging: Yes ; ultrasound demonstrating intact RTC on 04/06/23.   Red flags (personal history of cancer, chills/fever, night sweats, nausea, vomiting, unrelenting pain, unexplained weight gain/loss): Negative  PRECAUTIONS: None  WEIGHT BEARING RESTRICTIONS: No  FALLS: Has patient fallen in last 6 months? 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   Hobbies: Museum/gallery curator, taking care of dogs, chickens, pigs   Patient Goals:  Restore strength of R upper limb     OBJECTIVE (data from initial evaluation unless otherwise dated):     Patient Surveys  FOTO: 43, predicted outcome score of 61    Posture Inc thoracic kyphosis, rounded scapulae and internally rotated shoulders  Cervical Screen AROM: WFL and painless with overpressure in all planes Spurlings A (ipsilateral lateral flexion/axial compression): R: Negative L: Negative   AROM AROM (Normal range in degrees) AROM  Cervical  Flexion (50) 40  Extension (80) 50  Right lateral flexion (45) 22 "tight"   Left lateral flexion (45) 27 "tight"  Right rotation (85) 45  Left rotation (85) 66   Right Left  Shoulder 78 90*  Flexion    Extension    Abduction 106 30*  External Rotation 88 (arm at side) 82 (arm at side)  Internal Rotation    Hands Behind Head C7 T4  Hands Behind Back L1 L1      (* = pain; Blank rows = not tested)  UE MMT: MMT (out of 5) Right Left       Shoulder   Flexion 4* 4*  Extension    Abduction 4* 3+*  External rotation 4+ 4+  Internal rotation 4* 4*  Horizontal abduction    Horizontal adduction    Lower Trapezius    Rhomboids        Elbow  Flexion 5 4*  Extension 4* 5  Pronation    Supination        (* = pain; Blank rows = not tested)   Palpation Location LEFT  RIGHT           Subocciptials    Cervical paraspinals  1  Upper Trapezius  1  Levator Scapulae  1  Rhomboid Major/Minor    Sternoclavicular joint    Acromioclavicular joint    Coracoid process    Long head of biceps  1  Supraspinatus  1  Infraspinatus    Subscapularis    Teres Minor    Teres Major    Pectoralis Major    Pectoralis Minor    Anterior Deltoid  1  Lateral Deltoid  1  Posterior Deltoid    Latissimus Dorsi    Sternocleidomastoid    (Blank rows = not tested) Graded on 0-4 scale (0 = no pain, 1 = pain, 2 = pain with wincing/grimacing/flinching, 3 = pain with withdrawal, 4 = unwilling to allow palpation), (Blank rows = not tested)     TODAY'S TREATMENT    SUBJECTIVE STATEMENT:  Patient reports more L shoulder pain at this  time. She reports no significant pain in her R shoulder presently. Pt is compliant with HEP.     Manual Therapy - for symptom modulation, soft tissue sensitivity and mobility, joint mobility, ROM   Gentle R shoulder PROM within pt tolerance; x 3 minutes STM anterior/lateral deltoid and biceps; x 3 minutes  *not today* Glenohumeral joint mobilization; gr II; 2 x 30 sec for posterior and inferior directions    Therapeutic Exercise - for improved soft tissue flexibility and extensibility as needed for ROM, improved strength as needed to improve performance of CKC activities/functional movements  Sidelying shoulder abduction AROM; 1x10 Reclined shoulder flexion AROM, RUE; 1x10 at 45 deg, 1x10 at 60 deg  Shoulder adduction with Tband, ABD AAROM (Green Tband); 2x10  Serratus slide with foam roller, to access overhead ROM and for serratus anterior/periscapular mm re-training; 2x10  Standing single-arm row with Blue Tband;  2x10  PATIENT EDUCATION: HEP  update and review. For contralateral shoulder pain, we discussed use of cryotherapy, gentle AAROM, isometrics with low intensity as tolerated, and OTC meds for pain control as directed by her physician.   *not today* Reclined shoulder flexion AAROM with wand;   PATIENT EDUCATION:  Education details: see above for patient education details Person educated: Patient Education method: Explanation, Demonstration, and Handouts Education comprehension: verbalized understanding and returned demonstration   HOME EXERCISE PROGRAM:  Access Code: [email or web address] URL: [email or web address] Date: 05/08/2023 Prepared by: Consuela Mimes  Exercises - Shoulder Flexion Wall Slide with Towel  - 2 x daily - 7 x weekly - 2 sets - 10 reps - Standing Shoulder Abduction Wall Slide with Thumb Out  - 2 x daily - 7 x weekly - 2 sets - 10 reps - Seated Upper Trapezius Stretch  - 2 x daily - 7 x weekly - 3 sets - 30sec hold - Reclined Shoulder  Elevation  - 2 x daily - 7 x weekly - 2 sets - 10 reps - Sidelying Shoulder Abduction Palm Forward  - 2 x daily - 7 x weekly - 2 sets - 10 reps - Shoulder External Rotation with Anchored Resistance  - 1 x daily - 7 x weekly - 2 sets - 10 reps - Shoulder Internal Rotation with Resistance  - 1 x daily - 7 x weekly - 2 sets - 10 reps - Standing Shoulder Row with Anchored Resistance  - 1 x daily - 7 x weekly - 2 sets - 10 reps - 3sec hold    ASSESSMENT:  CLINICAL IMPRESSION: Patient demonstrates improving ability to elevate R arm against gravity. Recent injection and hydrodilation has benefited pt with her having minimal R shoulder pain at this time; primary c/o weakness and difficulty with reaching and lifting. We discussed gradual progression of forward elevation of shoulder (discussed lawn chair progression) and updated HEP for single-arm periscapular isotonics. Pt to continue with RTC strengthening with light resistance band at home. We will progress with resistance drills gradually as tolerated. Pt has remaining deficits in: bilateral shoulder AROM, RTC/deltoid/periscapular strength, postural changes, and local nociceptive shoulder pain bilaterally (current referral for post-op (left) shoulder). Pt will continue to benefit from skilled PT services to address deficits and improve function.   OBJECTIVE IMPAIRMENTS: decreased ROM, decreased strength, hypomobility, impaired flexibility, impaired UE functional use, postural dysfunction, and pain.   ACTIVITY LIMITATIONS: carrying, lifting, dressing, reach over head, hygiene/grooming  PARTICIPATION LIMITATIONS: meal prep, cleaning, laundry, driving, and community activity  PERSONAL FACTORS: Past/current experiences, Time since onset of injury/illness/exacerbation, and 3+ comorbidities: (Asthma, HTN, post-operative status for L shoulder)  are also affecting patient's functional outcome.   REHAB POTENTIAL: Good  CLINICAL DECISION MAKING:  Evolving/moderate complexity  EVALUATION COMPLEXITY: Moderate   GOALS: Goals reviewed with patient? Yes  SHORT TERM GOALS: Target date: 05/24/2023  Pt will be independent with HEP to improve strength and decrease neck pain to improve pain-free function at home and work. Baseline: 05/01/23: HEP updated from established program following recent episode of care.  Goal status: INITIAL   LONG TERM GOALS: Target date: 06/15/2023  Pt will increase FOTO to at least 61 to demonstrate significant improvement in function at home and work related to neck pain  Baseline: 05/01/23: 44 Goal status: INITIAL  2.  Pt will decrease worst shoulder pain by at least 3 points on the NPRS in order to demonstrate clinically significant reduction in shoulder pain. Baseline: 05/01/23: 8/10 pain at worst Goal status: INITIAL  3.  Pt will demonstrate R shoulder AROM to 150 deg for forward elevation and abduction as needed for reaching and self-care ADls   Baseline: 05/01/23: motion loss for flexion, abduction.  Goal status: INITIAL  4. Pt will improve strength of R shoulder to 4+/5 for all assessed motions indicative of improved strength required for lifting household items     Baseline: 05/01/23: R shoulder flx/abd/IR 4/5 with pain. Goal status: INITIAL   PLAN: PT FREQUENCY: 1x/week  PT DURATION: 6 weeks  PLANNED INTERVENTIONS: Therapeutic exercises, Therapeutic activity, Neuromuscular re-education, Balance training, Gait training, Patient/Family education, Self Care, Joint mobilization, Joint manipulation, Vestibular training, Canalith repositioning, Orthotic/Fit training, DME instructions, Dry Needling, Electrical stimulation, Spinal manipulation, Spinal mobilization, Cryotherapy, Moist heat, Taping, Traction, Ultrasound, Ionotophoresis 4mg /ml Dexamethasone, Manual therapy, and Re-evaluation.  PLAN FOR NEXT SESSION: R shoulder ROM, progressive AROM with pt moving from supine to reclined sitting to standing;  progress with RTC and deltoid strengthening.     Consuela Mimes, PT, DPT #[id number]  Gertie Exon, PT 05/15/2023, 11:06 AM

## 2023-05-22 ENCOUNTER — Encounter: Payer: Medicaid Other | Admitting: Physical Therapy

## 2023-05-29 ENCOUNTER — Ambulatory Visit: Payer: Medicaid Other | Admitting: Physical Therapy

## 2023-05-29 NOTE — Therapy (Deleted)
OUTPATIENT PHYSICAL THERAPY TREATMENT  Patient Name: Anita Mendoza MRN: [id number] DOB:1971/03/14, 52 y.o., female Today's Date: 05/29/2023  END OF SESSION:      Past Medical History:  Diagnosis Date   Asthma 2014   Hypertension    Past Surgical History:  Procedure Laterality Date   ABDOMINAL HYSTERECTOMY     carpul tunnel Bilateral 2011   choleycystectomy N/A 2000   ROTATOR CUFF REPAIR Left 2012   TONSILLECTOMY N/A 1979   Patient Active Problem List   Diagnosis Date Noted   Pain in right shoulder 05/01/2023    PCP: Laqueta Due, MD  REFERRING PROVIDER: Maurice March. Alveria Apley, MD  REFERRING DIAG: [phone] (ICD-10-CM) - Other specified postprocedural states   RATIONALE FOR EVALUATION AND TREATMENT: Rehabilitation  THERAPY DIAG: Right shoulder pain, unspecified chronicity  Stiffness of right shoulder, not elsewhere classified  Muscle weakness (generalized)  Acute pain of right shoulder  ONSET DATE: s/p Right shoulder diagnostic arthroscopy, debridement, cyst excision along upper border of subscapularis, and open biceps tenodesis 10/13/22  FOLLOW-UP APPT SCHEDULED WITH REFERRING PROVIDER: Yes , ####  PERTINENT HISTORY: Pt is a 52 year old female s/p Right shoulder diagnostic arthroscopy, debridement, cyst excision along upper border of subscapularis, and open biceps tenodesis 10/13/22. S/p injection 04/06/23 R subacromial bursa. Pt had difficulty making progress with post-op rehab due to persistent R upper quarter pain in spite of healing timeline following arthroscopy and use of dry needling, modalities, and manual therapy. We discussed following up with MD for ongoing issues inconsistent with post-operative timeframe. Pt reports improvement in pain with injection on 8/22, but she has ongoing weakness and limited RUE functional use (e.g. reaching/grasping, dressing, overhead work, household chores). Patient reports activity limitations in particular with dressing at  this time due to R shoulder mobility deficits and pain.    PAIN:  Pain Intensity: Present: 0/10, Best: 0/10, Worst: 8/10 Pain location: anterior arm down bicipital groove, anterior/middle deltoid region  Pain Quality:  "somebody pulling/stretching shoulder"   Radiating: Yes , to R upper arm  Numbness/Tingling: No Focal Weakness: Yes; difficulty gripping/holding items  Aggravating factors: elevate/move arm overhead, driving/turning steering wheel Relieving factors: hand in lap/arm in guarded position, minimal use 24-hour pain behavior: None History of prior shoulder or neck/shoulder injury, pain, surgery, or therapy: Yes, R shoulder arthroscopy 10/13/22; Hx of neck pain, but no treatment/procedures or specific injuries or neck Sx  Falls: Has patient fallen in last 6 months? No, Number of falls: N/A Dominant hand: right  Imaging: Yes ; ultrasound demonstrating intact RTC on 04/06/23.   Red flags (personal history of cancer, chills/fever, night sweats, nausea, vomiting, unrelenting pain, unexplained weight gain/loss): Negative  PRECAUTIONS: None  WEIGHT BEARING RESTRICTIONS: No  FALLS: Has patient fallen in last 6 months? 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   Hobbies: Museum/gallery curator, taking care of dogs, chickens, pigs   Patient Goals: Restore strength of R upper limb     OBJECTIVE (data from initial evaluation unless otherwise dated):    Patient Surveys  FOTO: 44, predicted outcome score of 61    Posture Inc thoracic kyphosis, rounded scapulae and internally rotated shoulders  Cervical Screen AROM: WFL and painless with overpressure in all planes Spurlings A (ipsilateral lateral flexion/axial compression): R: Negative L: Negative   AROM AROM (Normal range in degrees) AROM   Cervical  Flexion (50) 40  Extension (80) 50  Right lateral flexion (45) 22 "tight"   Left lateral flexion (45) 27 "tight"  Right rotation (85) 45  Left rotation (85) 66   Right Left  Shoulder 78 90*  Flexion    Extension    Abduction 106 30*  External Rotation 88 (arm at side) 82 (arm at side)  Internal Rotation    Hands Behind Head C7 T4  Hands Behind Back L1 L1      (* = pain; Blank rows = not tested)  UE MMT: MMT (out of 5) Right Left       Shoulder   Flexion 4* 4*  Extension    Abduction 4* 3+*  External rotation 4+ 4+  Internal rotation 4* 4*  Horizontal abduction    Horizontal adduction    Lower Trapezius    Rhomboids        Elbow  Flexion 5 4*  Extension 4* 5  Pronation    Supination        (* = pain; Blank rows = not tested)   Palpation Location LEFT  RIGHT           Subocciptials    Cervical paraspinals  1  Upper Trapezius  1  Levator Scapulae  1  Rhomboid Major/Minor    Sternoclavicular joint    Acromioclavicular joint    Coracoid process    Long head of biceps  1  Supraspinatus  1  Infraspinatus    Subscapularis    Teres Minor    Teres Major    Pectoralis Major    Pectoralis Minor    Anterior Deltoid  1  Lateral Deltoid  1  Posterior Deltoid    Latissimus Dorsi    Sternocleidomastoid    (Blank rows = not tested) Graded on 0-4 scale (0 = no pain, 1 = pain, 2 = pain with wincing/grimacing/flinching, 3 = pain with withdrawal, 4 = unwilling to allow palpation), (Blank rows = not tested)     TODAY'S TREATMENT    SUBJECTIVE STATEMENT:  Patient reports more L shoulder pain at this time. She reports no significant pain in her R shoulder presently. Pt is compliant with HEP.     Manual Therapy - for symptom modulation, soft tissue sensitivity and mobility, joint mobility, ROM   Gentle R shoulder PROM within pt tolerance; x 3 minutes STM anterior/lateral deltoid and biceps; x 3 minutes  *not today* Glenohumeral joint  mobilization; gr II; 2 x 30 sec for posterior and inferior directions    Therapeutic Exercise - for improved soft tissue flexibility and extensibility as needed for ROM, improved strength as needed to improve performance of CKC activities/functional movements  Sidelying shoulder abduction AROM; 1x10 Reclined shoulder flexion AROM, RUE; 1x10 at 45 deg, 1x10 at 60 deg  Shoulder adduction with Tband, ABD AAROM (Green Tband); 2x10  Serratus slide with foam roller, to access overhead ROM and for serratus anterior/periscapular mm re-training; 2x10  Standing single-arm row with Blue Tband;  2x10  PATIENT EDUCATION: HEP update and review. For contralateral shoulder pain, we discussed use of cryotherapy, gentle AAROM, isometrics with low intensity as tolerated, and OTC meds for pain control as directed by her physician.   *not today* Reclined shoulder flexion AAROM with wand;   PATIENT EDUCATION:  Education details: see above for patient education details Person educated: Patient Education method: Explanation, Demonstration, and Handouts Education comprehension: verbalized understanding and returned demonstration   HOME EXERCISE PROGRAM:  Access Code: [email or web address] URL: [email or web address] Date: 05/08/2023  Prepared by: Consuela Mimes  Exercises - Shoulder Flexion Wall Slide with Towel  - 2 x daily - 7 x weekly - 2 sets - 10 reps - Standing Shoulder Abduction Wall Slide with Thumb Out  - 2 x daily - 7 x weekly - 2 sets - 10 reps - Seated Upper Trapezius Stretch  - 2 x daily - 7 x weekly - 3 sets - 30sec hold - Reclined Shoulder Elevation  - 2 x daily - 7 x weekly - 2 sets - 10 reps - Sidelying Shoulder Abduction Palm Forward  - 2 x daily - 7 x weekly - 2 sets - 10 reps - Shoulder External Rotation with Anchored Resistance  - 1 x daily - 7 x weekly - 2 sets - 10 reps - Shoulder Internal Rotation with Resistance  - 1 x daily - 7 x weekly - 2 sets - 10 reps - Standing  Shoulder Row with Anchored Resistance  - 1 x daily - 7 x weekly - 2 sets - 10 reps - 3sec hold    ASSESSMENT:  CLINICAL IMPRESSION: Patient demonstrates improving ability to elevate R arm against gravity. Recent injection and hydrodilation has benefited pt with her having minimal R shoulder pain at this time; primary c/o weakness and difficulty with reaching and lifting. We discussed gradual progression of forward elevation of shoulder (discussed lawn chair progression) and updated HEP for single-arm periscapular isotonics. Pt to continue with RTC strengthening with light resistance band at home. We will progress with resistance drills gradually as tolerated. Pt has remaining deficits in: bilateral shoulder AROM, RTC/deltoid/periscapular strength, postural changes, and local nociceptive shoulder pain bilaterally (current referral for post-op (left) shoulder). Pt will continue to benefit from skilled PT services to address deficits and improve function.   OBJECTIVE IMPAIRMENTS: decreased ROM, decreased strength, hypomobility, impaired flexibility, impaired UE functional use, postural dysfunction, and pain.   ACTIVITY LIMITATIONS: carrying, lifting, dressing, reach over head, hygiene/grooming  PARTICIPATION LIMITATIONS: meal prep, cleaning, laundry, driving, and community activity  PERSONAL FACTORS: Past/current experiences, Time since onset of injury/illness/exacerbation, and 3+ comorbidities: (Asthma, HTN, post-operative status for L shoulder)  are also affecting patient's functional outcome.   REHAB POTENTIAL: Good  CLINICAL DECISION MAKING: Evolving/moderate complexity  EVALUATION COMPLEXITY: Moderate   GOALS: Goals reviewed with patient? Yes  SHORT TERM GOALS: Target date: 05/24/2023  Pt will be independent with HEP to improve strength and decrease neck pain to improve pain-free function at home and work. Baseline: 05/01/23: HEP updated from established program following recent  episode of care.  Goal status: INITIAL   LONG TERM GOALS: Target date: 06/15/2023  Pt will increase FOTO to at least 61 to demonstrate significant improvement in function at home and work related to neck pain  Baseline: 05/01/23: 44 Goal status: INITIAL  2.  Pt will decrease worst shoulder pain by at least 3 points on the NPRS in order to demonstrate clinically significant reduction in shoulder pain. Baseline: 05/01/23: 8/10 pain at worst Goal status: INITIAL  3.  Pt will demonstrate R shoulder AROM to 150 deg for forward elevation and abduction as needed for reaching and self-care ADls   Baseline: 05/01/23: motion loss for flexion, abduction.  Goal status: INITIAL  4. Pt will improve strength of R shoulder to 4+/5 for all assessed motions indicative of improved strength required for lifting household items     Baseline: 05/01/23: R shoulder flx/abd/IR 4/5 with pain. Goal status: INITIAL   PLAN: PT FREQUENCY: 1x/week  PT DURATION:  6 weeks  PLANNED INTERVENTIONS: Therapeutic exercises, Therapeutic activity, Neuromuscular re-education, Balance training, Gait training, Patient/Family education, Self Care, Joint mobilization, Joint manipulation, Vestibular training, Canalith repositioning, Orthotic/Fit training, DME instructions, Dry Needling, Electrical stimulation, Spinal manipulation, Spinal mobilization, Cryotherapy, Moist heat, Taping, Traction, Ultrasound, Ionotophoresis 4mg /ml Dexamethasone, Manual therapy, and Re-evaluation.  PLAN FOR NEXT SESSION: R shoulder ROM, progressive AROM with pt moving from supine to reclined sitting to standing; progress with RTC and deltoid strengthening.     Consuela Mimes, PT, DPT #[id number]  Gertie Exon, PT 05/29/2023, 8:30 AM

## 2023-06-05 ENCOUNTER — Ambulatory Visit: Payer: Medicaid Other | Attending: Orthopedic Surgery | Admitting: Physical Therapy

## 2023-06-05 DIAGNOSIS — M25611 Stiffness of right shoulder, not elsewhere classified: Secondary | ICD-10-CM | POA: Insufficient documentation

## 2023-06-05 DIAGNOSIS — M6281 Muscle weakness (generalized): Secondary | ICD-10-CM | POA: Insufficient documentation

## 2023-06-05 DIAGNOSIS — M25511 Pain in right shoulder: Secondary | ICD-10-CM | POA: Insufficient documentation

## 2023-06-05 NOTE — Therapy (Deleted)
OUTPATIENT PHYSICAL THERAPY TREATMENT  Patient Name: Anita Mendoza MRN: [id number] DOB:09-24-1970, 52 y.o., female Today's Date: 06/05/2023  END OF SESSION:      Past Medical History:  Diagnosis Date   Asthma 2014   Hypertension    Past Surgical History:  Procedure Laterality Date   ABDOMINAL HYSTERECTOMY     carpul tunnel Bilateral 2011   choleycystectomy N/A 2000   ROTATOR CUFF REPAIR Left 2012   TONSILLECTOMY N/A 1979   Patient Active Problem List   Diagnosis Date Noted   Pain in right shoulder 05/01/2023    PCP: Laqueta Due, MD  REFERRING PROVIDER: Maurice March. Alveria Apley, MD  REFERRING DIAG: [phone] (ICD-10-CM) - Other specified postprocedural states   RATIONALE FOR EVALUATION AND TREATMENT: Rehabilitation  THERAPY DIAG: Right shoulder pain, unspecified chronicity  Stiffness of right shoulder, not elsewhere classified  Muscle weakness (generalized)  ONSET DATE: s/p Right shoulder diagnostic arthroscopy, debridement, cyst excision along upper border of subscapularis, and open biceps tenodesis 10/13/22  FOLLOW-UP APPT SCHEDULED WITH REFERRING PROVIDER: Yes , ####  PERTINENT HISTORY: Pt is a 52 year old female s/p Right shoulder diagnostic arthroscopy, debridement, cyst excision along upper border of subscapularis, and open biceps tenodesis 10/13/22. S/p injection 04/06/23 R subacromial bursa. Pt had difficulty making progress with post-op rehab due to persistent R upper quarter pain in spite of healing timeline following arthroscopy and use of dry needling, modalities, and manual therapy. We discussed following up with MD for ongoing issues inconsistent with post-operative timeframe. Pt reports improvement in pain with injection on 8/22, but she has ongoing weakness and limited RUE functional use (e.g. reaching/grasping, dressing, overhead work, household chores). Patient reports activity limitations in particular with dressing at this time due to R shoulder  mobility deficits and pain.    PAIN:  Pain Intensity: Present: 0/10, Best: 0/10, Worst: 8/10 Pain location: anterior arm down bicipital groove, anterior/middle deltoid region  Pain Quality:  "somebody pulling/stretching shoulder"   Radiating: Yes , to R upper arm  Numbness/Tingling: No Focal Weakness: Yes; difficulty gripping/holding items  Aggravating factors: elevate/move arm overhead, driving/turning steering wheel Relieving factors: hand in lap/arm in guarded position, minimal use 24-hour pain behavior: None History of prior shoulder or neck/shoulder injury, pain, surgery, or therapy: Yes, R shoulder arthroscopy 10/13/22; Hx of neck pain, but no treatment/procedures or specific injuries or neck Sx  Falls: Has patient fallen in last 6 months? No, Number of falls: N/A Dominant hand: right  Imaging: Yes ; ultrasound demonstrating intact RTC on 04/06/23.   Red flags (personal history of cancer, chills/fever, night sweats, nausea, vomiting, unrelenting pain, unexplained weight gain/loss): Negative  PRECAUTIONS: None  WEIGHT BEARING RESTRICTIONS: No  FALLS: Has patient fallen in last 6 months? 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   Hobbies: Museum/gallery curator, taking care of dogs, chickens, pigs   Patient Goals: Restore strength of R upper limb     OBJECTIVE (data from initial evaluation unless otherwise dated):    Patient Surveys  FOTO: 44, predicted outcome score of 61    Posture Inc thoracic kyphosis, rounded scapulae and internally rotated shoulders  Cervical Screen AROM: WFL and painless with overpressure in all planes Spurlings A (ipsilateral lateral flexion/axial compression): R: Negative L: Negative   AROM AROM (Normal range in degrees) AROM  Cervical  Flexion (50)  40  Extension (80) 50  Right  lateral flexion (45) 22 "tight"   Left lateral flexion (45) 27 "tight"  Right rotation (85) 45  Left rotation (85) 66   Right Left  Shoulder 78 90*  Flexion    Extension    Abduction 106 30*  External Rotation 88 (arm at side) 82 (arm at side)  Internal Rotation    Hands Behind Head C7 T4  Hands Behind Back L1 L1      (* = pain; Blank rows = not tested)  UE MMT: MMT (out of 5) Right Left       Shoulder   Flexion 4* 4*  Extension    Abduction 4* 3+*  External rotation 4+ 4+  Internal rotation 4* 4*  Horizontal abduction    Horizontal adduction    Lower Trapezius    Rhomboids        Elbow  Flexion 5 4*  Extension 4* 5  Pronation    Supination        (* = pain; Blank rows = not tested)   Palpation Location LEFT  RIGHT           Subocciptials    Cervical paraspinals  1  Upper Trapezius  1  Levator Scapulae  1  Rhomboid Major/Minor    Sternoclavicular joint    Acromioclavicular joint    Coracoid process    Long head of biceps  1  Supraspinatus  1  Infraspinatus    Subscapularis    Teres Minor    Teres Major    Pectoralis Major    Pectoralis Minor    Anterior Deltoid  1  Lateral Deltoid  1  Posterior Deltoid    Latissimus Dorsi    Sternocleidomastoid    (Blank rows = not tested) Graded on 0-4 scale (0 = no pain, 1 = pain, 2 = pain with wincing/grimacing/flinching, 3 = pain with withdrawal, 4 = unwilling to allow palpation), (Blank rows = not tested)     TODAY'S TREATMENT    SUBJECTIVE STATEMENT:  Patient reports more L shoulder pain at this time. She reports no significant pain in her R shoulder presently. Pt is compliant with HEP.     Manual Therapy - for symptom modulation, soft tissue sensitivity and mobility, joint mobility, ROM   Gentle R shoulder PROM within pt tolerance; x 3 minutes STM anterior/lateral deltoid and biceps; x 3 minutes  *not today* Glenohumeral joint mobilization; gr II; 2 x 30 sec for  posterior and inferior directions    Therapeutic Exercise - for improved soft tissue flexibility and extensibility as needed for ROM, improved strength as needed to improve performance of CKC activities/functional movements  Sidelying shoulder abduction AROM; 1x10 Reclined shoulder flexion AROM, RUE; 1x10 at 45 deg, 1x10 at 60 deg  Shoulder adduction with Tband, ABD AAROM (Green Tband); 2x10  Serratus slide with foam roller, to access overhead ROM and for serratus anterior/periscapular mm re-training; 2x10  Standing single-arm row with Blue Tband;  2x10  PATIENT EDUCATION: HEP update and review. For contralateral shoulder pain, we discussed use of cryotherapy, gentle AAROM, isometrics with low intensity as tolerated, and OTC meds for pain control as directed by her physician.   *not today* Reclined shoulder flexion AAROM with wand;   PATIENT EDUCATION:  Education details: see above for patient education details Person educated: Patient Education method: Explanation, Demonstration, and Handouts Education comprehension: verbalized understanding and returned demonstration   HOME EXERCISE PROGRAM:  Access Code: [email or web address] URL: [email or web address] Date: 05/08/2023 Prepared by: Consuela Mimes  Exercises - Shoulder Flexion Wall Slide with Towel  - 2 x daily - 7 x weekly - 2 sets - 10 reps - Standing Shoulder Abduction Wall Slide with Thumb Out  - 2 x daily - 7 x weekly - 2 sets - 10 reps - Seated Upper Trapezius Stretch  - 2 x daily - 7 x weekly - 3 sets - 30sec hold - Reclined Shoulder Elevation  - 2 x daily - 7 x weekly - 2 sets - 10 reps - Sidelying Shoulder Abduction Palm Forward  - 2 x daily - 7 x weekly - 2 sets - 10 reps - Shoulder External Rotation with Anchored Resistance  - 1 x daily - 7 x weekly - 2 sets - 10 reps - Shoulder Internal Rotation with Resistance  - 1 x daily - 7 x weekly - 2 sets - 10 reps - Standing Shoulder Row with Anchored Resistance  - 1  x daily - 7 x weekly - 2 sets - 10 reps - 3sec hold    ASSESSMENT:  CLINICAL IMPRESSION: Patient demonstrates improving ability to elevate R arm against gravity. Recent injection and hydrodilation has benefited pt with her having minimal R shoulder pain at this time; primary c/o weakness and difficulty with reaching and lifting. We discussed gradual progression of forward elevation of shoulder (discussed lawn chair progression) and updated HEP for single-arm periscapular isotonics. Pt to continue with RTC strengthening with light resistance band at home. We will progress with resistance drills gradually as tolerated. Pt has remaining deficits in: bilateral shoulder AROM, RTC/deltoid/periscapular strength, postural changes, and local nociceptive shoulder pain bilaterally (current referral for post-op (left) shoulder). Pt will continue to benefit from skilled PT services to address deficits and improve function.   OBJECTIVE IMPAIRMENTS: decreased ROM, decreased strength, hypomobility, impaired flexibility, impaired UE functional use, postural dysfunction, and pain.   ACTIVITY LIMITATIONS: carrying, lifting, dressing, reach over head, hygiene/grooming  PARTICIPATION LIMITATIONS: meal prep, cleaning, laundry, driving, and community activity  PERSONAL FACTORS: Past/current experiences, Time since onset of injury/illness/exacerbation, and 3+ comorbidities: (Asthma, HTN, post-operative status for L shoulder)  are also affecting patient's functional outcome.   REHAB POTENTIAL: Good  CLINICAL DECISION MAKING: Evolving/moderate complexity  EVALUATION COMPLEXITY: Moderate   GOALS: Goals reviewed with patient? Yes  SHORT TERM GOALS: Target date: 05/24/2023  Pt will be independent with HEP to improve strength and decrease neck pain to improve pain-free function at home and work. Baseline: 05/01/23: HEP updated from established program following recent episode of care.  Goal status: INITIAL   LONG  TERM GOALS: Target date: 06/15/2023  Pt will increase FOTO to at least 61 to demonstrate significant improvement in function at home and work related to neck pain  Baseline: 05/01/23: 44 Goal status: INITIAL  2.  Pt will decrease worst shoulder pain by at least 3 points on the NPRS in order to demonstrate clinically significant reduction in shoulder pain. Baseline: 05/01/23: 8/10 pain at worst Goal status: INITIAL  3.  Pt will demonstrate R shoulder AROM to 150 deg for forward elevation and abduction as needed for reaching and self-care ADls   Baseline: 05/01/23: motion loss for flexion, abduction.  Goal status: INITIAL  4. Pt will improve strength of R shoulder to 4+/5 for all assessed motions indicative of improved strength required for lifting household items     Baseline: 05/01/23: R shoulder flx/abd/IR 4/5 with pain. Goal status: INITIAL   PLAN: PT FREQUENCY: 1x/week  PT DURATION: 6 weeks  PLANNED INTERVENTIONS:  Therapeutic exercises, Therapeutic activity, Neuromuscular re-education, Balance training, Gait training, Patient/Family education, Self Care, Joint mobilization, Joint manipulation, Vestibular training, Canalith repositioning, Orthotic/Fit training, DME instructions, Dry Needling, Electrical stimulation, Spinal manipulation, Spinal mobilization, Cryotherapy, Moist heat, Taping, Traction, Ultrasound, Ionotophoresis 4mg /ml Dexamethasone, Manual therapy, and Re-evaluation.  PLAN FOR NEXT SESSION: R shoulder ROM, progressive AROM with pt moving from supine to reclined sitting to standing; progress with RTC and deltoid strengthening.     Consuela Mimes, PT, DPT #[id number]  Gertie Exon, PT 06/05/2023, 7:40 AM

## 2023-07-14 LAB — COLOGUARD: COLOGUARD: NEGATIVE

## 2023-07-14 LAB — EXTERNAL GENERIC LAB PROCEDURE: COLOGUARD: NEGATIVE

## 2023-09-14 ENCOUNTER — Encounter (INDEPENDENT_AMBULATORY_CARE_PROVIDER_SITE_OTHER): Payer: Self-pay | Admitting: *Deleted

## 2023-11-21 ENCOUNTER — Ambulatory Visit: Admitting: Physical Therapy

## 2023-11-23 ENCOUNTER — Ambulatory Visit: Attending: Orthopedic Surgery | Admitting: Physical Therapy

## 2023-11-23 ENCOUNTER — Encounter: Payer: Self-pay | Admitting: Physical Therapy

## 2023-11-23 DIAGNOSIS — M25612 Stiffness of left shoulder, not elsewhere classified: Secondary | ICD-10-CM

## 2023-11-23 DIAGNOSIS — M6281 Muscle weakness (generalized): Secondary | ICD-10-CM

## 2023-11-23 DIAGNOSIS — M25512 Pain in left shoulder: Secondary | ICD-10-CM | POA: Diagnosis present

## 2023-11-23 NOTE — Therapy (Signed)
 OUTPATIENT PHYSICAL THERAPY SHOULDER EVALUATION  Patient Name: Anita Mendoza MRN: [id number] DOB:Jan 05, 1971, 53 y.o., female Today's Date: 11/23/2023  END OF SESSION:  PT End of Session - 11/23/23 1033     Visit Number 1    Number of Visits 13    Date for PT Re-Evaluation 01/04/24    Authorization Type [employer] 2025    PT Start Time 1032    PT Stop Time 1114    PT Time Calculation (min) 42 min    Activity Tolerance Patient tolerated treatment well    Behavior During Therapy WFL for tasks assessed/performed             Past Medical History:  Diagnosis Date   Asthma 2014   Hypertension    Past Surgical History:  Procedure Laterality Date   ABDOMINAL HYSTERECTOMY     carpul tunnel Bilateral 2011   choleycystectomy N/A 2000   ROTATOR CUFF REPAIR Left 2012   TONSILLECTOMY N/A 1979   Patient Active Problem List   Diagnosis Date Noted   Pain in right shoulder 05/01/2023    PCP: Laqueta Due, MD  REFERRING PROVIDER: Evaristo Bury, PA-C  REFERRING DIAG:  [phone] (ICD-10-CM) - Pain in left shoulder  G89.29 (ICD-10-CM) - Other chronic pain  M67.814 (ICD-10-CM) - Other specified disorders of tendon, left shoulder    RATIONALE FOR EVALUATION AND TREATMENT: Rehabilitation  THERAPY DIAG: Left shoulder pain, unspecified chronicity  Stiffness of left shoulder, not elsewhere classified  Muscle weakness (generalized)  ONSET DATE: October 2024  FOLLOW-UP APPT SCHEDULED WITH REFERRING PROVIDER: None currently scheduled in EMR   SUBJECTIVE:                                                                                                                                                                                         SUBJECTIVE STATEMENT:  Pt is a 53 year old female known to this clinic - currently referred for L shoulder pain. Hx of R shoulder arthroscopy 10/13/22 with biceps tenodesis with prolonged rehab process due to persistent pain.    PERTINENT HISTORY:  Pt is a 53 year old female known to this clinic - currently referred for L shoulder pain. Hx of R shoulder arthroscopy 10/13/22 with biceps tenodesis with prolonged rehab process due to persistent pain. Hx of L RCR in 2012.   Pt is remaining deficits with her R shoulder that were present during her previous episode of care. Pt reports pain affecting biceps region on both sides. Past med Hx of HTN, asthma. Pt needs help for dressing and pays for getting hair done.   Patient reports pain at night and disturbed sleep secondary to shoulder  pain. She reports numbness/tingling that can go down to L wrist; some remote history of neck pain. Pt massages area with CBD cream, and she feels that his helps along upper quarter with her pain.    PAIN:  Pain Intensity: Present: 7/10, Best: 5/10, Worst: 9/10 Pain location: L>R shoulder, biceps mm belly, anterior shoulder Pain Quality:  electric, "running" pain down L upper limb, numbness/tingling    Radiating: Yes , down to L arm/forearm and as far as wrist Numbness/Tingling: Yes; down L upper limb  Focal Weakness: Yes; unable to hold heavy items in either upper limb  Aggravating factors: reaching upward, lifting heavier items, dressing activities  Relieving factors: CBD cream, not using arms as much;  24-hour pain behavior: worse in AM  History of prior shoulder or neck/shoulder injury, pain, surgery, or therapy: Yes; L RCR in 2012, R shoulder arthroscopy with biceps tenodesis/SAD in 09/2022 Falls: Has patient fallen in last 6 months? No, Number of falls: N/A Dominant hand: right Imaging: Yes ;  IMPRESSION:  Left shoulder: 1. Moderate supraspinatus tendinosis with fraying of bursal surface fibers. Mild subacromial subdeltoid bursal distention. 2. Severe subscapularis tendinosis with a high-grade, partial-thickness tear of the subscapularis tendon. there is a severe subscapularis muscle atrophy with fatty infiltration. 3. Mild  acromioclavicular osteoarthrosis. 4. Moderate biceps tendinosis.    Red flags (personal history of cancer, chills/fever, night sweats, nausea, vomiting, unrelenting pain, unexplained weight gain/loss): Negative  PRECAUTIONS: None  WEIGHT BEARING RESTRICTIONS: No  FALLS: Has patient fallen in last 6 months? 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  Recent memory is intact.  Remote memory is intact.  Attention span and concentration are intact.  Expressive speech is intact.  Patient's fund of knowledge is within normal limits for educational level.    Gross Musculoskeletal Assessment Tremor: None Bulk: Normal Tone: Normal   Posture Rounded shoulders, mild protracted scapulae at rest  Cervical Screen Deferred  AROM AROM (Normal range in degrees) AROM 11/28/23   Right Left  Shoulder    Flexion 98 110  Extension    Abduction 136 80  External Rotation (arm at side) 82 64  Internal Rotation    Hands Behind Head C6 L upper trap  Hands Behind Back T12 L1      Elbow    Flexion WNL WNL  Extension WNL WNL  Pronation WNL WNL  Supination WNL WNL  (* = pain; Blank rows = not tested)  UE MMT: MMT (out of 5) Right Left   Cervical (isometric)  Flexion WNL  Extension WNL  Lateral Flexion WNL WNL  Rotation WNL WNL      Shoulder   Flexion 4-* 4-*  Extension    Abduction 4* 4*  External rotation 4* 4  Internal rotation 4* 4*  Horizontal abduction    Horizontal adduction    Lower Trapezius    Rhomboids         (* = pain; Blank rows = not tested)   Sensation Deferred  Reflexes Deferred  Palpation Location LEFT  RIGHT  Subocciptials    Cervical paraspinals    Upper Trapezius    Levator Scapulae    Rhomboid Major/Minor    Sternoclavicular joint    Acromioclavicular joint 1 1  Coracoid process 1 1  Long head of biceps 2 2  Supraspinatus 1 1  Infraspinatus    Subscapularis    Teres Minor    Teres Major    Pectoralis Major 1 1  Pectoralis Minor    Anterior Deltoid 1 2  Lateral Deltoid 1 2  Posterior Deltoid 1 1  Latissimus Dorsi    Sternocleidomastoid    (Blank rows = not tested) Graded on 0-4 scale (0 = no pain, 1 = pain, 2 = pain with wincing/grimacing/flinching, 3 = pain with withdrawal, 4 = unwilling to allow palpation), (Blank rows = not tested)    SPECIAL TESTS  Bear Hug: R Negative, L Negative  Rotator Cuff  Drop Arm Test: R Negative, L Negative Painful Arc (Pain from 60 to 120 degrees scaption): R Positive, L Positive External Rotation Resistance: R Posiitve, L Negative    Bicep Tendon Pathology Speed (shoulder flexion to 90, external rotation, full elbow extension, and forearm supination with resistance: R Mild pain, L Positive (significant pain) Yergason's (resisted shoulder ER and supination/biceps tendon pathology): R Negative, L Positive for mild shoulder pain     TODAY'S TREATMENT 11/23/2023   Therapeutic Exercise - for HEP establishment, discussion on appropriate exercise/activity modification, PT education   Reviewed baseline home exercises and provided handout for [facility] program (see Access Code); tactile cueing and therapist demonstration utilized as needed for carryover of proper technique to HEP.    Patient education on current condition, anatomy involved, prognosis, plan of care. Discussion on activity modification to prevent flare-up of condition, including avoidance of repetitive overhead movements and frequent use of ice for  anti-inflammatory effect.      PATIENT EDUCATION:  Education details: see above for patient education details Person educated: Patient Education method: Explanation, Demonstration, and Handouts Education comprehension: verbalized understanding and returned demonstration   HOME EXERCISE PROGRAM:  Access Code: [email or web address] URL: [email or web address] Date: 11/23/2023 Prepared by: Denese Finn  Exercises - Isometric Shoulder Flexion at Wall  - 2 x daily - 7 x weekly - 2 sets - 10 reps - 5sec hold - Standing Isometric Shoulder Internal Rotation at Doorway  - 2 x daily - 7 x weekly - 2 sets - 10 reps - 5sec hold - Standing Isometric Shoulder External Rotation with Doorway  - 2 x daily - 7 x weekly - 2 sets - 10 reps - 5sec hold    ASSESSMENT:  CLINICAL IMPRESSION: Patient is a 53 y.o. female who was seen today for physical therapy evaluation and treatment for L shoulder pain; previous episodes of care related to R shoulder arthroscopy with biceps tenodesis with poor progress and persistent pain with mobility deficits (R shoulder arthroscopy completed 10/13/22). Pt does have MR findings of L rotator cuff tendinosis and subscapularis partial thickness tear with fatty infiltration. Pt has bilateral deficits in shoulder A/PROM, RTC and deltoid strength, anterior shoulder/upper arm pain, L upper limb paresthesias intermittently. Pt will continue to benefit from skilled PT services to address deficits and improve function.   OBJECTIVE IMPAIRMENTS: decreased ROM, decreased strength, hypomobility, impaired flexibility, impaired UE functional use, postural dysfunction, and pain.   ACTIVITY LIMITATIONS: carrying, lifting, bathing, dressing, reach over head, and hygiene/grooming  PARTICIPATION LIMITATIONS: meal prep, cleaning, laundry, driving, shopping, and community activity  PERSONAL FACTORS: Past/current experiences, Time since  onset of injury/illness/exacerbation, and 3+  comorbidities: (Asthma, HTN, hx of L RCR, and hx of R shoulder arthroscopy with poor progress)  are also affecting patient's functional outcome.   REHAB POTENTIAL: Fair Hx of R shoulder arthroscopy with biceps tenodesis, bursectomy, debridement with poor post-op rehab progress  CLINICAL DECISION MAKING: Unstable/unpredictable  EVALUATION COMPLEXITY: High   GOALS: Goals reviewed with patient? Yes  SHORT TERM GOALS: Target date: 12/14/2023  Pt will be independent with HEP to improve strength and decrease shoulder pain to improve pain-free function at home and work. Baseline: 11/23/23: Baseline HEP reviewed, 3-way shoulder isometrics (see [facility] program).  Goal status: INITIAL   LONG TERM GOALS: Target date: 01/04/2024  Pt will have forward elevation of bilateral shoulders to 120 deg or greater as needed for self-care and ADLs Baseline: 11/23/23: Forward flexion AROM R 98, L 110 Goal status: INITIAL  2.  Pt will decrease worst shoulder pain by at least 3 points on the NPRS in order to demonstrate clinically significant reduction in shoulder pain. Baseline: 11/23/23: 10/10 at worst Goal status: INITIAL  3.  Pt will decrease quick DASH score to less than or equal to 20% in order to demonstrate clinically significant reduction in disability related to shoulder pain    Baseline: 11/23/23: 84% Goal status: INITIAL  4. Pt will increase strength by to 4+/5 or greater MMT grade for assessed shoulder musculature in order to demonstrate improvement in strength and function      Baseline: 11/23/23: Strength 4- to 4 with pain during testing. Goal status: INITIAL  5. Pt will achieve functional ER to ipsilateral scapula and functional IR to bra line as needed for dressing, self-care ADLs     Baseline: 11/23/23: Limited functional ER bilat (see chart above for hand behind head/back) Goal status: INITIAL  PLAN: PT FREQUENCY: 1-2x/week  PT DURATION: 6 weeks  PLANNED INTERVENTIONS: Therapeutic  exercises, Therapeutic activity, Neuromuscular re-education, Balance training, Gait training, Patient/Family education, Self Care, Joint mobilization, Joint manipulation, Vestibular training, Canalith repositioning, Orthotic/Fit training, DME instructions, Dry Needling, Electrical stimulation, Spinal manipulation, Spinal mobilization, Cryotherapy, Moist heat, Taping, Traction, Ultrasound, Ionotophoresis 4mg /ml Dexamethasone, Manual therapy, and Re-evaluation.  PLAN FOR NEXT SESSION: Complete cervical spine screen. Check scapular mobility and passive accessories for shoulders. Initiate PT with supine gentle AAROM within tolerable ROM and table slides. Progress A/AAROM as tolerated.    Denese Finn, PT, DPT #[id number]  Aleatha Hunting, PT 11/23/2023, 10:33 AM

## 2023-11-28 ENCOUNTER — Encounter: Payer: Self-pay | Admitting: Physical Therapy

## 2023-11-28 ENCOUNTER — Ambulatory Visit: Admitting: Physical Therapy

## 2023-11-28 DIAGNOSIS — M25512 Pain in left shoulder: Secondary | ICD-10-CM | POA: Diagnosis not present

## 2023-11-28 DIAGNOSIS — M25612 Stiffness of left shoulder, not elsewhere classified: Secondary | ICD-10-CM

## 2023-11-28 DIAGNOSIS — M6281 Muscle weakness (generalized): Secondary | ICD-10-CM

## 2023-11-28 NOTE — Therapy (Signed)
 OUTPATIENT PHYSICAL THERAPY SHOULDER TREATMENT  Patient Name: Anita Mendoza MRN: [id number] DOB:08-May-1971, 53 y.o., female Today's Date: 11/28/2023  END OF SESSION:  PT End of Session - 11/28/23 1025     Visit Number 2    Number of Visits 13    Date for PT Re-Evaluation 01/04/24    Authorization Type [employer] 2025    PT Start Time 1031    PT Stop Time 1113    PT Time Calculation (min) 42 min    Activity Tolerance Patient tolerated treatment well    Behavior During Therapy WFL for tasks assessed/performed             Past Medical History:  Diagnosis Date   Asthma 2014   Hypertension    Past Surgical History:  Procedure Laterality Date   ABDOMINAL HYSTERECTOMY     carpul tunnel Bilateral 2011   choleycystectomy N/A 2000   ROTATOR CUFF REPAIR Left 2012   TONSILLECTOMY N/A 1979   Patient Active Problem List   Diagnosis Date Noted   Pain in right shoulder 05/01/2023    PCP: Laqueta Due, MD  REFERRING PROVIDER: Evaristo Bury, PA-C  REFERRING DIAG:  [id number] (ICD-10-CM) - Pain in left shoulder  G89.29 (ICD-10-CM) - Other chronic pain  M67.814 (ICD-10-CM) - Other specified disorders of tendon, left shoulder    RATIONALE FOR EVALUATION AND TREATMENT: Rehabilitation  THERAPY DIAG: Left shoulder pain, unspecified chronicity  Stiffness of left shoulder, not elsewhere classified  Muscle weakness (generalized)  ONSET DATE: October 2024  FOLLOW-UP APPT SCHEDULED WITH REFERRING PROVIDER: None currently scheduled in EMR  PERTINENT HISTORY:  Pt is a 53 year old female known to this clinic - currently referred for L shoulder pain. Hx of R shoulder arthroscopy 10/13/22 with biceps tenodesis with prolonged rehab process due to persistent pain. Hx of L RCR in 2012.   Pt is remaining deficits with her R shoulder that were present during her previous episode of care. Pt reports pain affecting biceps region on both sides. Past med Hx of HTN, asthma.  Pt needs help for dressing and pays for getting hair done.   Patient reports pain at night and disturbed sleep secondary to shoulder pain. She reports numbness/tingling that can go down to L wrist; some remote history of neck pain. Pt massages area with CBD cream, and she feels that his helps along upper quarter with her pain.    PAIN:  Pain Intensity: Present: 7/10, Best: 5/10, Worst: 9/10 Pain location: L>R shoulder, biceps mm belly, anterior shoulder Pain Quality:  electric, "running" pain down L upper limb, numbness/tingling    Radiating: Yes , down to L arm/forearm and as far as wrist Numbness/Tingling: Yes; down L upper limb  Focal Weakness: Yes; unable to hold heavy items in either upper limb  Aggravating factors: reaching upward, lifting heavier items, dressing activities  Relieving factors: CBD cream, not using arms as much;  24-hour pain behavior: worse in AM  History of prior shoulder or neck/shoulder injury, pain, surgery, or therapy: Yes; L RCR in 2012, R shoulder arthroscopy with biceps tenodesis/SAD in 09/2022 Falls: Has patient fallen in last 6 months? No, Number of falls: N/A Dominant hand: right Imaging: Yes ;  IMPRESSION:  Left shoulder: 1. Moderate supraspinatus tendinosis with fraying of bursal surface fibers. Mild subacromial subdeltoid bursal distention. 2. Severe subscapularis tendinosis with a high-grade, partial-thickness tear of the subscapularis tendon. there is a severe subscapularis muscle atrophy with fatty infiltration. 3. Mild acromioclavicular osteoarthrosis. 4. Moderate biceps tendinosis.  Red flags (personal history of cancer, chills/fever, night sweats, nausea, vomiting, unrelenting pain, unexplained weight gain/loss): Negative  PRECAUTIONS: None  WEIGHT BEARING RESTRICTIONS: No  FALLS: Has patient fallen in last 6 months? 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  Recent memory is intact.  Remote memory is intact.  Attention span and concentration are intact.  Expressive speech is intact.  Patient's fund of knowledge is within normal limits for educational level.    Gross Musculoskeletal Assessment Tremor: None Bulk: Normal Tone: Normal   Posture Rounded shoulders, mild protracted scapulae at rest  Cervical Screen* AROM: WFL and painless with overpressure in all planes Spurlings A (ipsilateral lateral flexion/axial compression): R: Negative L: Negative Distraction: Negative for alleviation/change in concordant pain  Hoffman Sign (cervical cord compression): R: Negative L: Negative   AROM AROM (Normal range in degrees) AROM 11/28/23   Right Left  Shoulder    Flexion 98 110  Extension    Abduction 136 80  External Rotation (arm at side) 82 64  Internal Rotation    Hands Behind Head C6 L upper trap  Hands Behind Back T12 L1      Elbow    Flexion WNL WNL  Extension WNL WNL  Pronation WNL WNL  Supination WNL WNL  (* = pain; Blank rows = not tested)  UE MMT: MMT (out of 5) Right Left   Cervical (isometric)  Flexion WNL  Extension WNL  Lateral Flexion WNL WNL  Rotation WNL WNL      Shoulder   Flexion 4-* 4-*  Extension    Abduction 4* 4*  External rotation 4* 4  Internal rotation 4* 4*  Horizontal abduction    Horizontal adduction    Lower Trapezius    Rhomboids        (* = pain; Blank rows = not  tested)   Sensation Deferred  Reflexes Deferred  Palpation Location LEFT  RIGHT           Subocciptials    Cervical paraspinals    Upper Trapezius    Levator Scapulae    Rhomboid Major/Minor    Sternoclavicular joint    Acromioclavicular joint 1 1  Coracoid process 1 1  Long head of biceps 2 2  Supraspinatus 1 1  Infraspinatus    Subscapularis    Teres Minor    Teres Major    Pectoralis Major 1 1  Pectoralis Minor    Anterior Deltoid 1 2  Lateral Deltoid 1 2  Posterior Deltoid 1 1  Latissimus Dorsi    Sternocleidomastoid    (Blank rows = not tested) Graded on 0-4 scale (0 = no pain, 1 = pain, 2 = pain with wincing/grimacing/flinching, 3 = pain with withdrawal, 4 = unwilling to allow palpation), (Blank rows = not tested)   PASSIVE ACCESSORIES Glenohumeral: inferior glide WNL, posterior glide moderate restriction  Scapulothoracic: upward glide moderate restriction, posterior tilt mild restriction   SPECIAL TESTS  Bear Hug: R Negative, L Negative  Rotator Cuff  Drop Arm Test: R Negative, L  Negative Painful Arc (Pain from 60 to 120 degrees scaption): R Positive, L Positive External Rotation Resistance: R Posiitve, L Negative    Bicep Tendon Pathology Speed (shoulder flexion to 90, external rotation, full elbow extension, and forearm supination with resistance: R Mild pain, L Positive (significant pain) Yergason's (resisted shoulder ER and supination/biceps tendon pathology): R Negative, L Positive for mild shoulder pain      TODAY'S TREATMENT 11/28/2023   SUBJECTIVE STATEMENT:   Patient reports tolerating initial home exercises/isometrics relatively well. 6/10 pain at arrival. Patient reports her pain is relatively low/manageable for her at arrival today.    Completion of cervical spine screen* AROM: WFL and painless with overpressure in all planes Spurlings A (ipsilateral lateral flexion/axial compression): R: Negative L: Negative Distraction: Negative  for alleviation/change in concordant pain  Hoffman Sign (cervical cord compression): R: Negative L: Negative   Manual Therapy - for symptom modulation, soft tissue sensitivity and mobility, joint mobility, ROM   L shoulder PROM within pt tolerance within pt tolerance ; x 3 min Glenohumeral joint mobilizations; gr II for inferior glide; 2 x 30 sec bouts, gr III for posterior glide; 2 x 30 sec bouts Scapular mobilization; 2 x 30 sec bouts for posterior tilt and upward rotation    Therapeutic Exercise - for improved soft tissue flexibility and extensibility as needed for ROM, improved strength as needed to improve performance of CKC activities/functional movements  Serratus punch with dowel; 1 x 10  -pain with RUE Serratus punch with 3-lb Dbell; 1x15, LUE only Dowel AAROM shoulder flexion in supine; 20x  3-way shoulder isometrics (flexion, ER, IR); 5 sec holds; reviewed for HEP  PATIENT EDUCATION: Discussed use of serratus anterior activation at home with use of unilateral serratus punch if R upper limb cannot tolerate bilateral punch.     PATIENT EDUCATION:  Education details: see above for patient education details Person educated: Patient Education method: Explanation, Demonstration, and Handouts Education comprehension: verbalized understanding and returned demonstration   HOME EXERCISE PROGRAM:  Access Code: [email or web address] URL: [email or web address] Date: 11/23/2023 Prepared by: Denese Finn  Exercises - Isometric Shoulder Flexion at Wall  - 2 x daily - 7 x weekly - 2 sets - 10 reps - 5sec hold - Standing Isometric Shoulder Internal Rotation at Doorway  - 2 x daily - 7 x weekly - 2 sets - 10 reps - 5sec hold - Standing Isometric Shoulder External Rotation with Doorway  - 2 x daily - 7 x weekly - 2 sets - 10 reps - 5sec hold    ASSESSMENT:  CLINICAL IMPRESSION: Patient tolerates passive ROM well, but she has notable pain with shoulder abduction > 90 deg and  end-range forward flexion. She has grossly WNL L GHJ ER/IR. She is limited with R shoulder persistent pain after arthroscopy in February 2024 with poor progress post-operatively. Pt is limited with R shoulder performing serratus punch; this is well tolerated on L side. Pt has bilateral deficits in shoulder A/PROM, RTC and deltoid strength, anterior shoulder/upper arm pain, L upper limb paresthesias intermittently. Pt will continue to benefit from skilled PT services to address deficits and improve function.   OBJECTIVE IMPAIRMENTS: decreased ROM, decreased strength, hypomobility, impaired flexibility, impaired UE functional use, postural dysfunction, and pain.   ACTIVITY LIMITATIONS: carrying, lifting, bathing, dressing, reach over head, and hygiene/grooming  PARTICIPATION LIMITATIONS: meal prep, cleaning, laundry, driving, shopping, and community activity  PERSONAL FACTORS: Past/current experiences, Time since onset of injury/illness/exacerbation, and 3+ comorbidities: (Asthma, HTN, hx of L RCR,  and hx of R shoulder arthroscopy with poor progress)  are also affecting patient's functional outcome.   REHAB POTENTIAL: Fair Hx of R shoulder arthroscopy with biceps tenodesis, bursectomy, debridement with poor post-op rehab progress  CLINICAL DECISION MAKING: Unstable/unpredictable  EVALUATION COMPLEXITY: High   GOALS: Goals reviewed with patient? Yes  SHORT TERM GOALS: Target date: 12/14/2023  Pt will be independent with HEP to improve strength and decrease shoulder pain to improve pain-free function at home and work. Baseline: 11/23/23: Baseline HEP reviewed, 3-way shoulder isometrics (see [employer] program).  Goal status: INITIAL   LONG TERM GOALS: Target date: 01/04/2024  Pt will have forward elevation of bilateral shoulders to 120 deg or greater as needed for self-care and ADLs Baseline: 11/23/23: Forward flexion AROM R 98, L 110 Goal status: INITIAL  2.  Pt will decrease worst shoulder  pain by at least 3 points on the NPRS in order to demonstrate clinically significant reduction in shoulder pain. Baseline: 11/23/23: 10/10 at worst Goal status: INITIAL  3.  Pt will decrease quick DASH score to less than or equal to 20% in order to demonstrate clinically significant reduction in disability related to shoulder pain    Baseline: 11/23/23: 84% Goal status: INITIAL  4. Pt will increase strength by to 4+/5 or greater MMT grade for assessed shoulder musculature in order to demonstrate improvement in strength and function      Baseline: 11/23/23: Strength 4- to 4 with pain during testing. Goal status: INITIAL  5. Pt will achieve functional ER to ipsilateral scapula and functional IR to bra line as needed for dressing, self-care ADLs     Baseline: 11/23/23: Limited functional ER bilat (see chart above for hand behind head/back) Goal status: INITIAL  PLAN: PT FREQUENCY: 1-2x/week  PT DURATION: 6 weeks  PLANNED INTERVENTIONS: Therapeutic exercises, Therapeutic activity, Neuromuscular re-education, Balance training, Gait training, Patient/Family education, Self Care, Joint mobilization, Joint manipulation, Vestibular training, Canalith repositioning, Orthotic/Fit training, DME instructions, Dry Needling, Electrical stimulation, Spinal manipulation, Spinal mobilization, Cryotherapy, Moist heat, Taping, Traction, Ultrasound, Ionotophoresis 4mg /ml Dexamethasone, Manual therapy, and Re-evaluation.  PLAN FOR NEXT SESSION:  Continue PT with supine gentle AAROM within tolerable ROM and table slides. Progress A/AAROM as tolerated.    Denese Finn, PT, DPT #[id number]  Anita Mendoza, PT 11/28/2023, 10:31 AM

## 2023-11-30 ENCOUNTER — Ambulatory Visit: Admitting: Physical Therapy

## 2023-11-30 DIAGNOSIS — M25512 Pain in left shoulder: Secondary | ICD-10-CM

## 2023-11-30 DIAGNOSIS — M25612 Stiffness of left shoulder, not elsewhere classified: Secondary | ICD-10-CM

## 2023-11-30 DIAGNOSIS — M6281 Muscle weakness (generalized): Secondary | ICD-10-CM

## 2023-11-30 NOTE — Therapy (Signed)
 OUTPATIENT PHYSICAL THERAPY SHOULDER TREATMENT  Patient Name: Anita Mendoza MRN: [id number] DOB:08-31-1970, 53 y.o., female Today's Date: 11/30/2023  END OF SESSION:  PT End of Session - 11/30/23 1036     Visit Number 3    Number of Visits 13    Date for PT Re-Evaluation 01/04/24    Authorization Type [employer] 2025    PT Start Time 1036    PT Stop Time 1117    PT Time Calculation (min) 41 min    Activity Tolerance Patient tolerated treatment well    Behavior During Therapy WFL for tasks assessed/performed              Past Medical History:  Diagnosis Date   Asthma 2014   Hypertension    Past Surgical History:  Procedure Laterality Date   ABDOMINAL HYSTERECTOMY     carpul tunnel Bilateral 2011   choleycystectomy N/A 2000   ROTATOR CUFF REPAIR Left 2012   TONSILLECTOMY N/A 1979   Patient Active Problem List   Diagnosis Date Noted   Pain in right shoulder 05/01/2023    PCP: Neda Balk, MD  REFERRING PROVIDER: Rozann Cornell, PA-C  REFERRING DIAG:  [phone] (ICD-10-CM) - Pain in left shoulder  G89.29 (ICD-10-CM) - Other chronic pain  M67.814 (ICD-10-CM) - Other specified disorders of tendon, left shoulder    RATIONALE FOR EVALUATION AND TREATMENT: Rehabilitation  THERAPY DIAG: Left shoulder pain, unspecified chronicity  Stiffness of left shoulder, not elsewhere classified  Muscle weakness (generalized)  ONSET DATE: October 2024  FOLLOW-UP APPT SCHEDULED WITH REFERRING PROVIDER: None currently scheduled in EMR  PERTINENT HISTORY:  Pt is a 53 year old female known to this clinic - currently referred for L shoulder pain. Hx of R shoulder arthroscopy 10/13/22 with biceps tenodesis with prolonged rehab process due to persistent pain. Hx of L RCR in 2012.   Pt is remaining deficits with her R shoulder that were present during her previous episode of care. Pt reports pain affecting biceps region on both sides. Past med Hx of HTN,  asthma. Pt needs help for dressing and pays for getting hair done.   Patient reports pain at night and disturbed sleep secondary to shoulder pain. She reports numbness/tingling that can go down to L wrist; some remote history of neck pain. Pt massages area with CBD cream, and she feels that his helps along upper quarter with her pain.    PAIN:  Pain Intensity: Present: 7/10, Best: 5/10, Worst: 9/10 Pain location: L>R shoulder, biceps mm belly, anterior shoulder Pain Quality:  electric, "running" pain down L upper limb, numbness/tingling    Radiating: Yes , down to L arm/forearm and as far as wrist Numbness/Tingling: Yes; down L upper limb  Focal Weakness: Yes; unable to hold heavy items in either upper limb  Aggravating factors: reaching upward, lifting heavier items, dressing activities  Relieving factors: CBD cream, not using arms as much;  24-hour pain behavior: worse in AM  History of prior shoulder or neck/shoulder injury, pain, surgery, or therapy: Yes; L RCR in 2012, R shoulder arthroscopy with biceps tenodesis/SAD in 09/2022 Falls: Has patient fallen in last 6 months? No, Number of falls: N/A Dominant hand: right Imaging: Yes ;  IMPRESSION:  Left shoulder: 1. Moderate supraspinatus tendinosis with fraying of bursal surface fibers. Mild subacromial subdeltoid bursal distention. 2. Severe subscapularis tendinosis with a high-grade, partial-thickness tear of the subscapularis tendon. there is a severe subscapularis muscle atrophy with fatty infiltration. 3. Mild acromioclavicular osteoarthrosis. 4. Moderate biceps  tendinosis.    Red flags (personal history of cancer, chills/fever, night sweats, nausea, vomiting, unrelenting pain, unexplained weight gain/loss): Negative  PRECAUTIONS: Latex allergy  WEIGHT BEARING RESTRICTIONS: No  FALLS: Has patient fallen in last 6 months? 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  Recent memory is intact.  Remote memory is intact.  Attention span and concentration are intact.  Expressive speech is intact.  Patient's fund of knowledge is within normal limits for educational level.    Gross Musculoskeletal Assessment Tremor: None Bulk: Normal Tone: Normal   Posture Rounded shoulders, mild protracted scapulae at rest  Cervical Screen* AROM: WFL and painless with overpressure in all planes Spurlings A (ipsilateral lateral flexion/axial compression): R: Negative L: Negative Distraction: Negative for alleviation/change in concordant pain  Hoffman Sign (cervical cord compression): R: Negative L: Negative   AROM AROM (Normal range in degrees) AROM 11/28/23   Right Left  Shoulder    Flexion 98 110  Extension    Abduction 136 80  External Rotation (arm at side) 82 64  Internal Rotation    Hands Behind Head C6 L upper trap  Hands Behind Back T12 L1      Elbow    Flexion WNL WNL  Extension WNL WNL  Pronation WNL WNL  Supination WNL WNL  (* = pain; Blank rows = not tested)  UE MMT: MMT (out of 5) Right Left   Cervical (isometric)  Flexion WNL  Extension WNL  Lateral Flexion WNL WNL  Rotation WNL WNL      Shoulder   Flexion 4-* 4-*  Extension    Abduction 4* 4*  External rotation 4* 4  Internal rotation 4* 4*  Horizontal abduction    Horizontal adduction    Lower Trapezius    Rhomboids        (* = pain; Blank rows = not  tested)   Sensation Deferred  Reflexes Deferred  Palpation Location LEFT  RIGHT           Subocciptials    Cervical paraspinals    Upper Trapezius    Levator Scapulae    Rhomboid Major/Minor    Sternoclavicular joint    Acromioclavicular joint 1 1  Coracoid process 1 1  Long head of biceps 2 2  Supraspinatus 1 1  Infraspinatus    Subscapularis    Teres Minor    Teres Major    Pectoralis Major 1 1  Pectoralis Minor    Anterior Deltoid 1 2  Lateral Deltoid 1 2  Posterior Deltoid 1 1  Latissimus Dorsi    Sternocleidomastoid    (Blank rows = not tested) Graded on 0-4 scale (0 = no pain, 1 = pain, 2 = pain with wincing/grimacing/flinching, 3 = pain with withdrawal, 4 = unwilling to allow palpation), (Blank rows = not tested)   PASSIVE ACCESSORIES Glenohumeral: inferior glide WNL, posterior glide moderate restriction  Scapulothoracic: upward glide moderate restriction, posterior tilt mild restriction   SPECIAL TESTS  Bear Hug: R Negative, L Negative  Rotator Cuff  Drop  Arm Test: R Negative, L Negative Painful Arc (Pain from 60 to 120 degrees scaption): R Positive, L Positive External Rotation Resistance: R Posiitve, L Negative    Bicep Tendon Pathology Speed (shoulder flexion to 90, external rotation, full elbow extension, and forearm supination with resistance: R Mild pain, L Positive (significant pain) Yergason's (resisted shoulder ER and supination/biceps tendon pathology): R Negative, L Positive for mild shoulder pain      TODAY'S TREATMENT 11/30/2023   SUBJECTIVE STATEMENT:   Patient reports tolerating initial home exercises/isometrics relatively well. 6/10 pain at arrival. Patient reports her pain is relatively low/manageable for her at arrival today.     Manual Therapy - for symptom modulation, soft tissue sensitivity and mobility, joint mobility, ROM   L shoulder PROM within pt tolerance within pt tolerance ; x 3 min Glenohumeral joint  mobilizations; gr III for inferior glide; 3 x 30 sec bouts Scapular mobilization; 2 x 30 sec bouts for posterior tilt and upward rotation   *not today* GHJ mob gr III for posterior glide; 2 x 30 sec bouts    Therapeutic Exercise - for improved soft tissue flexibility and extensibility as needed for ROM, improved strength as needed to improve performance of CKC activities/functional movements  Serratus punch with dowel; 2 x 10  -pain with RUE  Dowel AAROM shoulder flexion in supine; 20x  Latex-free Theraband*  Bilateral shoulder flexion reactive isometric, Red Tband 20x L shoulder IR reactive isometric walkout; Red Tband; 2x10  PATIENT EDUCATION: Encouraged ongoing HEP and use of bilateral serratus activation as tolerated at home.   *not today* Serratus punch with 3-lb Dbell; 1x15, LUE only   PATIENT EDUCATION:  Education details: see above for patient education details Person educated: Patient Education method: Explanation, Demonstration, and Handouts Education comprehension: verbalized understanding and returned demonstration   HOME EXERCISE PROGRAM:  Access Code: [email or web address] URL: [email or web address] Date: 11/23/2023 Prepared by: Denese Finn  Exercises - Isometric Shoulder Flexion at Wall  - 2 x daily - 7 x weekly - 2 sets - 10 reps - 5sec hold - Standing Isometric Shoulder Internal Rotation at Doorway  - 2 x daily - 7 x weekly - 2 sets - 10 reps - 5sec hold - Standing Isometric Shoulder External Rotation with Doorway  - 2 x daily - 7 x weekly - 2 sets - 10 reps - 5sec hold    ASSESSMENT:  CLINICAL IMPRESSION: Patient exhibits improving L shoulder AAROM and ability to access ER/IR ROM. She has ongoing anterior shoulder pain with forward reaching and end-range ABD/ER. Pt tolerates progression of isometric exercises well without notable increase in pain; we will update isometric drills for her HEP with successive weeks of PT. Pt has bilateral deficits  in shoulder A/PROM, RTC and deltoid strength, anterior shoulder/upper arm pain, L upper limb paresthesias intermittently. Pt will continue to benefit from skilled PT services to address deficits and improve function.   OBJECTIVE IMPAIRMENTS: decreased ROM, decreased strength, hypomobility, impaired flexibility, impaired UE functional use, postural dysfunction, and pain.   ACTIVITY LIMITATIONS: carrying, lifting, bathing, dressing, reach over head, and hygiene/grooming  PARTICIPATION LIMITATIONS: meal prep, cleaning, laundry, driving, shopping, and community activity  PERSONAL FACTORS: Past/current experiences, Time since onset of injury/illness/exacerbation, and 3+ comorbidities: (Asthma, HTN, hx of L RCR, and hx of R shoulder arthroscopy with poor progress)  are also affecting patient's functional outcome.   REHAB POTENTIAL: Fair Hx of R shoulder arthroscopy with biceps tenodesis, bursectomy, debridement with poor post-op rehab progress  CLINICAL DECISION MAKING:  Unstable/unpredictable  EVALUATION COMPLEXITY: High   GOALS: Goals reviewed with patient? Yes  SHORT TERM GOALS: Target date: 12/14/2023  Pt will be independent with HEP to improve strength and decrease shoulder pain to improve pain-free function at home and work. Baseline: 11/23/23: Baseline HEP reviewed, 3-way shoulder isometrics (see [employer] program).  Goal status: INITIAL   LONG TERM GOALS: Target date: 01/04/2024  Pt will have forward elevation of bilateral shoulders to 120 deg or greater as needed for self-care and ADLs Baseline: 11/23/23: Forward flexion AROM R 98, L 110 Goal status: INITIAL  2.  Pt will decrease worst shoulder pain by at least 3 points on the NPRS in order to demonstrate clinically significant reduction in shoulder pain. Baseline: 11/23/23: 10/10 at worst Goal status: INITIAL  3.  Pt will decrease quick DASH score to less than or equal to 20% in order to demonstrate clinically significant reduction  in disability related to shoulder pain    Baseline: 11/23/23: 84% Goal status: INITIAL  4. Pt will increase strength by to 4+/5 or greater MMT grade for assessed shoulder musculature in order to demonstrate improvement in strength and function      Baseline: 11/23/23: Strength 4- to 4 with pain during testing. Goal status: INITIAL  5. Pt will achieve functional ER to ipsilateral scapula and functional IR to bra line as needed for dressing, self-care ADLs     Baseline: 11/23/23: Limited functional ER bilat (see chart above for hand behind head/back) Goal status: INITIAL  PLAN: PT FREQUENCY: 1-2x/week  PT DURATION: 6 weeks  PLANNED INTERVENTIONS: Therapeutic exercises, Therapeutic activity, Neuromuscular re-education, Balance training, Gait training, Patient/Family education, Self Care, Joint mobilization, Joint manipulation, Vestibular training, Canalith repositioning, Orthotic/Fit training, DME instructions, Dry Needling, Electrical stimulation, Spinal manipulation, Spinal mobilization, Cryotherapy, Moist heat, Taping, Traction, Ultrasound, Ionotophoresis 4mg /ml Dexamethasone, Manual therapy, and Re-evaluation.  PLAN FOR NEXT SESSION:  Continue PT with supine gentle AAROM within tolerable ROM and table slides. RTC and LHB isometrics. Progress A/AAROM as tolerated.    Denese Finn, PT, DPT #[id number]  Aleatha Hunting, PT 11/30/2023, 10:36 AM

## 2023-12-04 ENCOUNTER — Encounter: Payer: Self-pay | Admitting: Physical Therapy

## 2023-12-04 NOTE — Therapy (Deleted)
 OUTPATIENT PHYSICAL THERAPY SHOULDER TREATMENT  Patient Name: Anita Mendoza MRN: [id number] DOB:1971-05-27, 53 y.o., female Today's Date: 12/04/2023  END OF SESSION:     Past Medical History:  Diagnosis Date   Asthma 2014   Hypertension    Past Surgical History:  Procedure Laterality Date   ABDOMINAL HYSTERECTOMY     carpul tunnel Bilateral 2011   choleycystectomy N/A 2000   ROTATOR CUFF REPAIR Left 2012   TONSILLECTOMY N/A 1979   Patient Active Problem List   Diagnosis Date Noted   Pain in right shoulder 05/01/2023    PCP: Neda Balk, MD  REFERRING PROVIDER: Rozann Cornell, PA-C  REFERRING DIAG:  [phone] (ICD-10-CM) - Pain in left shoulder  G89.29 (ICD-10-CM) - Other chronic pain  M67.814 (ICD-10-CM) - Other specified disorders of tendon, left shoulder    RATIONALE FOR EVALUATION AND TREATMENT: Rehabilitation  THERAPY DIAG: Left shoulder pain, unspecified chronicity  Stiffness of left shoulder, not elsewhere classified  Muscle weakness (generalized)  ONSET DATE: October 2024  FOLLOW-UP APPT SCHEDULED WITH REFERRING PROVIDER: None currently scheduled in EMR  PERTINENT HISTORY:  Pt is a 53 year old female known to this clinic - currently referred for L shoulder pain. Hx of R shoulder arthroscopy 10/13/22 with biceps tenodesis with prolonged rehab process due to persistent pain. Hx of L RCR in 2012.   Pt is remaining deficits with her R shoulder that were present during her previous episode of care. Pt reports pain affecting biceps region on both sides. Past med Hx of HTN, asthma. Pt needs help for dressing and pays for getting hair done.   Patient reports pain at night and disturbed sleep secondary to shoulder pain. She reports numbness/tingling that can go down to L wrist; some remote history of neck pain. Pt massages area with CBD cream, and she feels that his helps along upper quarter with her pain.    PAIN:  Pain Intensity: Present: 7/10,  Best: 5/10, Worst: 9/10 Pain location: L>R shoulder, biceps mm belly, anterior shoulder Pain Quality:  electric, "running" pain down L upper limb, numbness/tingling    Radiating: Yes , down to L arm/forearm and as far as wrist Numbness/Tingling: Yes; down L upper limb  Focal Weakness: Yes; unable to hold heavy items in either upper limb  Aggravating factors: reaching upward, lifting heavier items, dressing activities  Relieving factors: CBD cream, not using arms as much;  24-hour pain behavior: worse in AM  History of prior shoulder or neck/shoulder injury, pain, surgery, or therapy: Yes; L RCR in 2012, R shoulder arthroscopy with biceps tenodesis/SAD in 09/2022 Falls: Has patient fallen in last 6 months? No, Number of falls: N/A Dominant hand: right Imaging: Yes ;  IMPRESSION:  Left shoulder: 1. Moderate supraspinatus tendinosis with fraying of bursal surface fibers. Mild subacromial subdeltoid bursal distention. 2. Severe subscapularis tendinosis with a high-grade, partial-thickness tear of the subscapularis tendon. there is a severe subscapularis muscle atrophy with fatty infiltration. 3. Mild acromioclavicular osteoarthrosis. 4. Moderate biceps tendinosis.    Red flags (personal history of cancer, chills/fever, night sweats, nausea, vomiting, unrelenting pain, unexplained weight gain/loss): Negative  PRECAUTIONS: Latex allergy  WEIGHT BEARING RESTRICTIONS: No  FALLS: Has patient fallen in last 6 months? 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  Recent memory is intact.  Remote memory is intact.  Attention span and concentration are intact.  Expressive speech is intact.  Patient's fund of knowledge is within normal limits for educational level.    Gross Musculoskeletal Assessment Tremor: None Bulk: Normal Tone: Normal   Posture Rounded shoulders, mild protracted scapulae at rest  Cervical Screen* AROM: WFL and painless with overpressure in all planes Spurlings A (ipsilateral lateral flexion/axial compression): R: Negative L: Negative Distraction: Negative for alleviation/change in concordant pain  Hoffman Sign (cervical cord compression): R: Negative L: Negative   AROM AROM (Normal range in degrees) AROM 11/28/23   Right Left  Shoulder    Flexion 98 110  Extension    Abduction 136 80  External Rotation (arm at side) 82 64  Internal Rotation    Hands Behind Head C6 L upper trap  Hands Behind Back T12 L1      Elbow    Flexion WNL WNL  Extension WNL WNL  Pronation WNL WNL  Supination WNL WNL  (* = pain; Blank rows = not tested)  UE MMT: MMT (out of 5) Right Left   Cervical (isometric)  Flexion WNL  Extension WNL  Lateral Flexion WNL WNL  Rotation WNL WNL      Shoulder   Flexion 4-* 4-*  Extension    Abduction 4* 4*  External rotation 4* 4  Internal rotation 4* 4*  Horizontal abduction    Horizontal adduction    Lower Trapezius    Rhomboids        (* = pain; Blank rows = not tested)   Sensation Deferred  Reflexes Deferred  Palpation Location LEFT  RIGHT           Subocciptials    Cervical paraspinals    Upper Trapezius    Levator Scapulae    Rhomboid Major/Minor    Sternoclavicular joint    Acromioclavicular joint 1 1  Coracoid process 1 1  Long head of biceps 2 2  Supraspinatus 1 1  Infraspinatus    Subscapularis    Teres Minor    Teres Major    Pectoralis Major 1 1  Pectoralis Minor     Anterior Deltoid 1 2  Lateral Deltoid 1 2  Posterior Deltoid 1 1  Latissimus Dorsi    Sternocleidomastoid    (Blank rows = not tested) Graded on 0-4 scale (0 = no pain, 1 = pain, 2 = pain with wincing/grimacing/flinching, 3 = pain with withdrawal, 4 = unwilling to allow palpation), (Blank rows = not tested)   PASSIVE ACCESSORIES Glenohumeral: inferior glide WNL, posterior glide moderate restriction  Scapulothoracic: upward glide moderate restriction, posterior tilt mild restriction   SPECIAL TESTS  Bear Hug: R Negative, L Negative  Rotator Cuff  Drop Arm Test: R Negative, L Negative Painful Arc (Pain from 60 to 120 degrees scaption): R Positive, L Positive External Rotation Resistance: R Posiitve, L Negative    Bicep Tendon Pathology Speed (shoulder flexion to 90, external rotation, full elbow extension, and forearm supination with resistance: R Mild pain, L Positive (significant pain) Yergason's (resisted shoulder ER and supination/biceps tendon pathology): R Negative, L Positive for mild shoulder pain      TODAY'S TREATMENT 12/04/2023   SUBJECTIVE STATEMENT:   Patient reports tolerating initial home  exercises/isometrics relatively well. 6/10 pain at arrival. Patient reports her pain is relatively low/manageable for her at arrival today.     Manual Therapy - for symptom modulation, soft tissue sensitivity and mobility, joint mobility, ROM   L shoulder PROM within pt tolerance within pt tolerance ; x 3 min Glenohumeral joint mobilizations; gr III for inferior glide; 3 x 30 sec bouts Scapular mobilization; 2 x 30 sec bouts for posterior tilt and upward rotation   *not today* GHJ mob gr III for posterior glide; 2 x 30 sec bouts    Therapeutic Exercise - for improved soft tissue flexibility and extensibility as needed for ROM, improved strength as needed to improve performance of CKC activities/functional movements  Serratus punch with dowel; 2 x 10  -pain with  RUE  Dowel AAROM shoulder flexion in supine; 20x  Latex-free Theraband*  Bilateral shoulder flexion reactive isometric, Red Tband 20x L shoulder IR reactive isometric walkout; Red Tband; 2x10  PATIENT EDUCATION: Discussed use of serratus anterior activation at home with use of unilateral serratus punch if R upper limb cannot tolerate bilateral punch.   *not today* Serratus punch with 3-lb Dbell; 1x15, LUE only   PATIENT EDUCATION:  Education details: see above for patient education details Person educated: Patient Education method: Explanation, Demonstration, and Handouts Education comprehension: verbalized understanding and returned demonstration   HOME EXERCISE PROGRAM:  Access Code: [email or web address] URL: [email or web address] Date: 11/23/2023 Prepared by: Denese Finn  Exercises - Isometric Shoulder Flexion at Wall  - 2 x daily - 7 x weekly - 2 sets - 10 reps - 5sec hold - Standing Isometric Shoulder Internal Rotation at Doorway  - 2 x daily - 7 x weekly - 2 sets - 10 reps - 5sec hold - Standing Isometric Shoulder External Rotation with Doorway  - 2 x daily - 7 x weekly - 2 sets - 10 reps - 5sec hold    ASSESSMENT:  CLINICAL IMPRESSION: Patient tolerates passive ROM well, but she has notable pain with shoulder abduction > 90 deg and end-range forward flexion. She has grossly WNL L GHJ ER/IR. She is limited with R shoulder persistent pain after arthroscopy in February 2024 with poor progress post-operatively. Pt is limited with R shoulder performing serratus punch; this is well tolerated on L side. Pt has bilateral deficits in shoulder A/PROM, RTC and deltoid strength, anterior shoulder/upper arm pain, L upper limb paresthesias intermittently. Pt will continue to benefit from skilled PT services to address deficits and improve function.   OBJECTIVE IMPAIRMENTS: decreased ROM, decreased strength, hypomobility, impaired flexibility, impaired UE functional use,  postural dysfunction, and pain.   ACTIVITY LIMITATIONS: carrying, lifting, bathing, dressing, reach over head, and hygiene/grooming  PARTICIPATION LIMITATIONS: meal prep, cleaning, laundry, driving, shopping, and community activity  PERSONAL FACTORS: Past/current experiences, Time since onset of injury/illness/exacerbation, and 3+ comorbidities: (Asthma, HTN, hx of L RCR, and hx of R shoulder arthroscopy with poor progress)  are also affecting patient's functional outcome.   REHAB POTENTIAL: Fair Hx of R shoulder arthroscopy with biceps tenodesis, bursectomy, debridement with poor post-op rehab progress  CLINICAL DECISION MAKING: Unstable/unpredictable  EVALUATION COMPLEXITY: High   GOALS: Goals reviewed with patient? Yes  SHORT TERM GOALS: Target date: 12/14/2023  Pt will be independent with HEP to improve strength and decrease shoulder pain to improve pain-free function at home and work. Baseline: 11/23/23: Baseline HEP reviewed, 3-way shoulder isometrics (see [employer] program).  Goal status: INITIAL   LONG TERM GOALS: Target date: 01/04/2024  Pt will have  forward elevation of bilateral shoulders to 120 deg or greater as needed for self-care and ADLs Baseline: 11/23/23: Forward flexion AROM R 98, L 110 Goal status: INITIAL  2.  Pt will decrease worst shoulder pain by at least 3 points on the NPRS in order to demonstrate clinically significant reduction in shoulder pain. Baseline: 11/23/23: 10/10 at worst Goal status: INITIAL  3.  Pt will decrease quick DASH score to less than or equal to 20% in order to demonstrate clinically significant reduction in disability related to shoulder pain    Baseline: 11/23/23: 84% Goal status: INITIAL  4. Pt will increase strength by to 4+/5 or greater MMT grade for assessed shoulder musculature in order to demonstrate improvement in strength and function      Baseline: 11/23/23: Strength 4- to 4 with pain during testing. Goal status: INITIAL  5.  Pt will achieve functional ER to ipsilateral scapula and functional IR to bra line as needed for dressing, self-care ADLs     Baseline: 11/23/23: Limited functional ER bilat (see chart above for hand behind head/back) Goal status: INITIAL  PLAN: PT FREQUENCY: 1-2x/week  PT DURATION: 6 weeks  PLANNED INTERVENTIONS: Therapeutic exercises, Therapeutic activity, Neuromuscular re-education, Balance training, Gait training, Patient/Family education, Self Care, Joint mobilization, Joint manipulation, Vestibular training, Canalith repositioning, Orthotic/Fit training, DME instructions, Dry Needling, Electrical stimulation, Spinal manipulation, Spinal mobilization, Cryotherapy, Moist heat, Taping, Traction, Ultrasound, Ionotophoresis 4mg /ml Dexamethasone, Manual therapy, and Re-evaluation.  PLAN FOR NEXT SESSION:  Continue PT with supine gentle AAROM within tolerable ROM and table slides. RTC and LHB isometrics. Progress A/AAROM as tolerated.   THIS NOTE IS INCOMPLETE, PLEASE DO NOT REFERENCE FOR INFORMATION  Denese Finn, PT, DPT #[id number]  Aleatha Hunting, PT 12/04/2023, 5:09 PM

## 2023-12-05 ENCOUNTER — Ambulatory Visit: Admitting: Physical Therapy

## 2023-12-05 DIAGNOSIS — M6281 Muscle weakness (generalized): Secondary | ICD-10-CM

## 2023-12-05 DIAGNOSIS — M25512 Pain in left shoulder: Secondary | ICD-10-CM

## 2023-12-05 DIAGNOSIS — M25612 Stiffness of left shoulder, not elsewhere classified: Secondary | ICD-10-CM

## 2023-12-07 ENCOUNTER — Ambulatory Visit: Admitting: Physical Therapy

## 2023-12-07 DIAGNOSIS — M25512 Pain in left shoulder: Secondary | ICD-10-CM | POA: Diagnosis not present

## 2023-12-07 DIAGNOSIS — M6281 Muscle weakness (generalized): Secondary | ICD-10-CM

## 2023-12-07 DIAGNOSIS — M25612 Stiffness of left shoulder, not elsewhere classified: Secondary | ICD-10-CM

## 2023-12-07 NOTE — Therapy (Signed)
 OUTPATIENT PHYSICAL THERAPY SHOULDER TREATMENT  Patient Name: Anita Mendoza MRN: [id number] DOB:02/01/1971, 53 y.o., female Today's Date: 12/07/2023  END OF SESSION:  PT End of Session - 12/07/23 1047     Visit Number 4    Number of Visits 13    Date for PT Re-Evaluation 01/04/24    Authorization Type [employer] 2025    PT Start Time 1045    PT Stop Time 1115    PT Time Calculation (min) 30 min    Activity Tolerance Patient tolerated treatment well    Behavior During Therapy [facility] for tasks assessed/performed             Past Medical History:  Diagnosis Date   Asthma 2014   Hypertension    Past Surgical History:  Procedure Laterality Date   ABDOMINAL HYSTERECTOMY     carpul tunnel Bilateral 2011   choleycystectomy N/A 2000   ROTATOR CUFF REPAIR Left 2012   TONSILLECTOMY N/A 1979   Patient Active Problem List   Diagnosis Date Noted   Pain in right shoulder 05/01/2023    PCP: Neda Balk, MD  REFERRING PROVIDER: Rozann Cornell, PA-C  REFERRING DIAG:  [phone] (ICD-10-CM) - Pain in left shoulder  G89.29 (ICD-10-CM) - Other chronic pain  M67.814 (ICD-10-CM) - Other specified disorders of tendon, left shoulder    RATIONALE FOR EVALUATION AND TREATMENT: Rehabilitation  THERAPY DIAG: Left shoulder pain, unspecified chronicity  Stiffness of left shoulder, not elsewhere classified  Muscle weakness (generalized)  ONSET DATE: October 2024  FOLLOW-UP APPT SCHEDULED WITH REFERRING PROVIDER: None currently scheduled in EMR  PERTINENT HISTORY:  Pt is a 53 year old female known to this clinic - currently referred for L shoulder pain. Hx of R shoulder arthroscopy 10/13/22 with biceps tenodesis with prolonged rehab process due to persistent pain. Hx of L RCR in 2012.   Pt is remaining deficits with her R shoulder that were present during her previous episode of care. Pt reports pain affecting biceps region on both sides. Past med Hx of HTN, asthma.  Pt needs help for dressing and pays for getting hair done.   Patient reports pain at night and disturbed sleep secondary to shoulder pain. She reports numbness/tingling that can go down to L wrist; some remote history of neck pain. Pt massages area with CBD cream, and she feels that his helps along upper quarter with her pain.    PAIN:  Pain Intensity: Present: 7/10, Best: 5/10, Worst: 9/10 Pain location: L>R shoulder, biceps mm belly, anterior shoulder Pain Quality:  electric, "running" pain down L upper limb, numbness/tingling    Radiating: Yes , down to L arm/forearm and as far as wrist Numbness/Tingling: Yes; down L upper limb  Focal Weakness: Yes; unable to hold heavy items in either upper limb  Aggravating factors: reaching upward, lifting heavier items, dressing activities  Relieving factors: CBD cream, not using arms as much;  24-hour pain behavior: worse in AM  History of prior shoulder or neck/shoulder injury, pain, surgery, or therapy: Yes; L RCR in 2012, R shoulder arthroscopy with biceps tenodesis/SAD in 09/2022 Falls: Has patient fallen in last 6 months? No, Number of falls: N/A Dominant hand: right Imaging: Yes ;  IMPRESSION:  Left shoulder: 1. Moderate supraspinatus tendinosis with fraying of bursal surface fibers. Mild subacromial subdeltoid bursal distention. 2. Severe subscapularis tendinosis with a high-grade, partial-thickness tear of the subscapularis tendon. there is a severe subscapularis muscle atrophy with fatty infiltration. 3. Mild acromioclavicular osteoarthrosis. 4. Moderate biceps tendinosis.  Red flags (personal history of cancer, chills/fever, night sweats, nausea, vomiting, unrelenting pain, unexplained weight gain/loss): Negative  PRECAUTIONS: Latex allergy  WEIGHT BEARING RESTRICTIONS: No  FALLS: Has patient fallen in last 6 months? 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  Recent memory is intact.  Remote memory is intact.  Attention span and concentration are intact.  Expressive speech is intact.  Patient's fund of knowledge is within normal limits for educational level.    Gross Musculoskeletal Assessment Tremor: None Bulk: Normal Tone: Normal   Posture Rounded shoulders, mild protracted scapulae at rest  Cervical Screen* AROM: WFL and painless with overpressure in all planes Spurlings A (ipsilateral lateral flexion/axial compression): R: Negative L: Negative Distraction: Negative for alleviation/change in concordant pain  Hoffman Sign (cervical cord compression): R: Negative L: Negative   AROM AROM (Normal range in degrees) AROM 11/28/23   Right Left  Shoulder    Flexion 98 110  Extension    Abduction 136 80  External Rotation (arm at side) 82 64  Internal Rotation    Hands Behind Head C6 L upper trap  Hands Behind Back T12 L1      Elbow    Flexion WNL WNL  Extension WNL WNL  Pronation WNL WNL  Supination WNL WNL  (* = pain; Blank rows = not tested)  UE MMT: MMT (out of 5) Right Left   Cervical (isometric)  Flexion WNL  Extension WNL  Lateral Flexion WNL WNL  Rotation WNL WNL      Shoulder   Flexion 4-* 4-*  Extension    Abduction 4* 4*  External rotation 4* 4  Internal rotation 4* 4*  Horizontal abduction    Horizontal adduction    Lower Trapezius    Rhomboids        (* = pain; Blank rows = not  tested)   Sensation Deferred  Reflexes Deferred  Palpation Location LEFT  RIGHT           Subocciptials    Cervical paraspinals    Upper Trapezius    Levator Scapulae    Rhomboid Major/Minor    Sternoclavicular joint    Acromioclavicular joint 1 1  Coracoid process 1 1  Long head of biceps 2 2  Supraspinatus 1 1  Infraspinatus    Subscapularis    Teres Minor    Teres Major    Pectoralis Major 1 1  Pectoralis Minor    Anterior Deltoid 1 2  Lateral Deltoid 1 2  Posterior Deltoid 1 1  Latissimus Dorsi    Sternocleidomastoid    (Blank rows = not tested) Graded on 0-4 scale (0 = no pain, 1 = pain, 2 = pain with wincing/grimacing/flinching, 3 = pain with withdrawal, 4 = unwilling to allow palpation), (Blank rows = not tested)   PASSIVE ACCESSORIES Glenohumeral: inferior glide WNL, posterior glide moderate restriction  Scapulothoracic: upward glide moderate restriction, posterior tilt mild restriction   SPECIAL TESTS  Bear Hug: R Negative, L Negative  Rotator Cuff  Drop Arm Test: R Negative,  L Negative Painful Arc (Pain from 60 to 120 degrees scaption): R Positive, L Positive External Rotation Resistance: R Posiitve, L Negative    Bicep Tendon Pathology Speed (shoulder flexion to 90, external rotation, full elbow extension, and forearm supination with resistance: R Mild pain, L Positive (significant pain) Yergason's (resisted shoulder ER and supination/biceps tendon pathology): R Negative, L Positive for mild shoulder pain      TODAY'S TREATMENT 12/07/2023   SUBJECTIVE STATEMENT:   Patient reports having notable pain with low back bothering her also. She reports 7/10 pain with R>L shoulder bothering her.     Manual Therapy - for symptom modulation, soft tissue sensitivity and mobility, joint mobility, ROM   Bolster under knees to improve back pain with supine lying: L shoulder PROM within pt tolerance within pt tolerance ; x 3 min Glenohumeral joint  mobilizations; gr II for A-P glide; 2 x 30 sec bouts; gr III for inferior glide; 3 x 30 sec bouts STM L biceps and anterior/lateral deltoid; x 3 min   *not today* Scapular mobilization; 2 x 30 sec bouts for posterior tilt and upward rotation  GHJ mob gr III for posterior glide; 2 x 30 sec bouts    Therapeutic Exercise - for improved soft tissue flexibility and extensibility as needed for ROM, improved strength as needed to improve performance of CKC activities/functional movements   Latex-free Theraband*  Tband high row for shoulder flexion AAROM during eccentric phase; 2 x 10  Bilateral shoulder flexion reactive isometric, Red Tband 20x L shoulder IR reactive isometric walkout; Red Tband; 2x10  Red Tband bilateral ER; 2 x 10  PATIENT EDUCATION: Encouraged continued HEP with Tbands (non-latex) given for home. MedBridge handout updated.    *not today* Serratus punch with 3-lb Dbell; 1x15, LUE only Serratus punch with dowel; 2 x 10 Dowel AAROM shoulder flexion in supine; 20x    Cold pack (unbilled) - for anti-inflammatory and analgesic effect as needed for reduced pain and improved ability to participate in active PT intervention, along bilat shoulders in sitting, x 5 minutes   PATIENT EDUCATION:  Education details: see above for patient education details Person educated: Patient Education method: Explanation, Demonstration, and Handouts Education comprehension: verbalized understanding and returned demonstration   HOME EXERCISE PROGRAM:  Access Code: [email or web address] URL: [email or web address] Date: 12/07/2023 Prepared by: Denese Finn  Exercises - Isometric Shoulder Flexion at Wall  - 2 x daily - 7 x weekly - 2 sets - 10 reps - 5sec hold - Standing Isometric Shoulder Internal Rotation at Doorway  - 2 x daily - 7 x weekly - 2 sets - 10 reps - 5sec hold - Standing Isometric Shoulder External Rotation with Doorway  - 2 x daily - 7 x weekly - 2 sets - 10 reps -  5sec hold - Standing Shoulder Flexion Reactive Isometrics with Elbow Extended  - 1 x daily - 7 x weekly - 2 sets - 10 reps - Shoulder Internal Rotation Reactive Isometrics  - 1 x daily - 7 x weekly - 2 sets - 10 reps    ASSESSMENT:  CLINICAL IMPRESSION: Patient has ongoing incremental improvements with L shoulder passive motion. Active motion is gradually improving. She is more limited with tolerance of forward flexion and reaching out with R upper limb following arthroscopy in previous year and difficulty with forward progress during post-op PT. Pt's home program was updated to include reactive isometrics to promote tendon remodeling/healing for bilateral LHB and RTC. Pt has bilateral deficits in shoulder A/PROM,  RTC and deltoid strength, anterior shoulder/upper arm pain, L upper limb paresthesias intermittently. Pt will continue to benefit from skilled PT services to address deficits and improve function.   OBJECTIVE IMPAIRMENTS: decreased ROM, decreased strength, hypomobility, impaired flexibility, impaired UE functional use, postural dysfunction, and pain.   ACTIVITY LIMITATIONS: carrying, lifting, bathing, dressing, reach over head, and hygiene/grooming  PARTICIPATION LIMITATIONS: meal prep, cleaning, laundry, driving, shopping, and community activity  PERSONAL FACTORS: Past/current experiences, Time since onset of injury/illness/exacerbation, and 3+ comorbidities: (Asthma, HTN, hx of L RCR, and hx of R shoulder arthroscopy with poor progress)  are also affecting patient's functional outcome.   REHAB POTENTIAL: Fair Hx of R shoulder arthroscopy with biceps tenodesis, bursectomy, debridement with poor post-op rehab progress  CLINICAL DECISION MAKING: Unstable/unpredictable  EVALUATION COMPLEXITY: High   GOALS: Goals reviewed with patient? Yes  SHORT TERM GOALS: Target date: 12/14/2023  Pt will be independent with HEP to improve strength and decrease shoulder pain to improve pain-free  function at home and work. Baseline: 11/23/23: Baseline HEP reviewed, 3-way shoulder isometrics (see MedBridge program).  Goal status: INITIAL   LONG TERM GOALS: Target date: 01/04/2024  Pt will have forward elevation of bilateral shoulders to 120 deg or greater as needed for self-care and ADLs Baseline: 11/23/23: Forward flexion AROM R 98, L 110 Goal status: INITIAL  2.  Pt will decrease worst shoulder pain by at least 3 points on the NPRS in order to demonstrate clinically significant reduction in shoulder pain. Baseline: 11/23/23: 10/10 at worst Goal status: INITIAL  3.  Pt will decrease quick DASH score to less than or equal to 20% in order to demonstrate clinically significant reduction in disability related to shoulder pain    Baseline: 11/23/23: 84% Goal status: INITIAL  4. Pt will increase strength by to 4+/5 or greater MMT grade for assessed shoulder musculature in order to demonstrate improvement in strength and function      Baseline: 11/23/23: Strength 4- to 4 with pain during testing. Goal status: INITIAL  5. Pt will achieve functional ER to ipsilateral scapula and functional IR to bra line as needed for dressing, self-care ADLs     Baseline: 11/23/23: Limited functional ER bilat (see chart above for hand behind head/back) Goal status: INITIAL  PLAN: PT FREQUENCY: 1-2x/week  PT DURATION: 6 weeks  PLANNED INTERVENTIONS: Therapeutic exercises, Therapeutic activity, Neuromuscular re-education, Balance training, Gait training, Patient/Family education, Self Care, Joint mobilization, Joint manipulation, Vestibular training, Canalith repositioning, Orthotic/Fit training, DME instructions, Dry Needling, Electrical stimulation, Spinal manipulation, Spinal mobilization, Cryotherapy, Moist heat, Taping, Traction, Ultrasound, Ionotophoresis 4mg /ml Dexamethasone, Manual therapy, and Re-evaluation.  PLAN FOR NEXT SESSION:  Continue PT with supine gentle AAROM within tolerable ROM and table  slides. RTC and LHB isometrics. Progress A/AAROM as tolerated.    Denese Finn, PT, DPT #[id number]  Aleatha Hunting, PT 12/07/2023, 10:47 AM

## 2023-12-12 ENCOUNTER — Ambulatory Visit: Admitting: Physical Therapy

## 2023-12-12 ENCOUNTER — Encounter: Payer: Self-pay | Admitting: Physical Therapy

## 2023-12-12 NOTE — Therapy (Deleted)
 OUTPATIENT PHYSICAL THERAPY SHOULDER TREATMENT  Patient Name: Anita Mendoza MRN: [id number] DOB:Feb 17, 1971, 53 y.o., female Today's Date: 12/12/2023  END OF SESSION:    Past Medical History:  Diagnosis Date   Asthma 2014   Hypertension    Past Surgical History:  Procedure Laterality Date   ABDOMINAL HYSTERECTOMY     carpul tunnel Bilateral 2011   choleycystectomy N/A 2000   ROTATOR CUFF REPAIR Left 2012   TONSILLECTOMY N/A 1979   Patient Active Problem List   Diagnosis Date Noted   Pain in right shoulder 05/01/2023    PCP: Neda Balk, MD  REFERRING PROVIDER: Rozann Cornell, PA-C  REFERRING DIAG:  [phone] (ICD-10-CM) - Pain in left shoulder  G89.29 (ICD-10-CM) - Other chronic pain  M67.814 (ICD-10-CM) - Other specified disorders of tendon, left shoulder    RATIONALE FOR EVALUATION AND TREATMENT: Rehabilitation  THERAPY DIAG: Left shoulder pain, unspecified chronicity  Stiffness of left shoulder, not elsewhere classified  Muscle weakness (generalized)  ONSET DATE: October 2024  FOLLOW-UP APPT SCHEDULED WITH REFERRING PROVIDER: None currently scheduled in EMR  PERTINENT HISTORY:  Pt is a 53 year old female known to this clinic - currently referred for L shoulder pain. Hx of R shoulder arthroscopy 10/13/22 with biceps tenodesis with prolonged rehab process due to persistent pain. Hx of L RCR in 2012.   Pt is remaining deficits with her R shoulder that were present during her previous episode of care. Pt reports pain affecting biceps region on both sides. Past med Hx of HTN, asthma. Pt needs help for dressing and pays for getting hair done.   Patient reports pain at night and disturbed sleep secondary to shoulder pain. She reports numbness/tingling that can go down to L wrist; some remote history of neck pain. Pt massages area with CBD cream, and she feels that his helps along upper quarter with her pain.    PAIN:  Pain Intensity: Present: 7/10,  Best: 5/10, Worst: 9/10 Pain location: L>R shoulder, biceps mm belly, anterior shoulder Pain Quality:  electric, "running" pain down L upper limb, numbness/tingling    Radiating: Yes , down to L arm/forearm and as far as wrist Numbness/Tingling: Yes; down L upper limb  Focal Weakness: Yes; unable to hold heavy items in either upper limb  Aggravating factors: reaching upward, lifting heavier items, dressing activities  Relieving factors: CBD cream, not using arms as much;  24-hour pain behavior: worse in AM  History of prior shoulder or neck/shoulder injury, pain, surgery, or therapy: Yes; L RCR in 2012, R shoulder arthroscopy with biceps tenodesis/SAD in 09/2022 Falls: Has patient fallen in last 6 months? No, Number of falls: N/A Dominant hand: right Imaging: Yes ;  IMPRESSION:  Left shoulder: 1. Moderate supraspinatus tendinosis with fraying of bursal surface fibers. Mild subacromial subdeltoid bursal distention. 2. Severe subscapularis tendinosis with a high-grade, partial-thickness tear of the subscapularis tendon. there is a severe subscapularis muscle atrophy with fatty infiltration. 3. Mild acromioclavicular osteoarthrosis. 4. Moderate biceps tendinosis.    Red flags (personal history of cancer, chills/fever, night sweats, nausea, vomiting, unrelenting pain, unexplained weight gain/loss): Negative  PRECAUTIONS: Latex allergy  WEIGHT BEARING RESTRICTIONS: No  FALLS: Has patient fallen in last 6 months? 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  Recent memory is intact.  Remote memory is intact.  Attention span and concentration are intact.  Expressive speech is intact.  Patient's fund of knowledge is within normal limits for educational level.    Gross Musculoskeletal Assessment Tremor: None Bulk: Normal Tone: Normal   Posture Rounded shoulders, mild protracted scapulae at rest  Cervical Screen* AROM: WFL and painless with overpressure in all planes Spurlings A (ipsilateral lateral flexion/axial compression): R: Negative L: Negative Distraction: Negative for alleviation/change in concordant pain  Hoffman Sign (cervical cord compression): R: Negative L: Negative   AROM AROM (Normal range in degrees) AROM 11/28/23   Right Left  Shoulder    Flexion 98 110  Extension    Abduction 136 80  External Rotation (arm at side) 82 64  Internal Rotation    Hands Behind Head C6 L upper trap  Hands Behind Back T12 L1      Elbow    Flexion WNL WNL  Extension WNL WNL  Pronation WNL WNL  Supination WNL WNL  (* = pain; Blank rows = not tested)  UE MMT: MMT (out of 5) Right Left   Cervical (isometric)  Flexion WNL  Extension WNL  Lateral Flexion WNL WNL  Rotation WNL WNL      Shoulder   Flexion 4-* 4-*  Extension    Abduction 4* 4*  External rotation 4* 4  Internal rotation 4* 4*  Horizontal abduction    Horizontal adduction    Lower Trapezius    Rhomboids        (* = pain; Blank rows = not tested)   Sensation Deferred  Reflexes Deferred  Palpation Location LEFT  RIGHT           Subocciptials    Cervical paraspinals    Upper Trapezius    Levator Scapulae    Rhomboid Major/Minor    Sternoclavicular joint    Acromioclavicular joint 1 1  Coracoid process 1 1  Long head of biceps 2 2  Supraspinatus 1 1  Infraspinatus    Subscapularis    Teres Minor    Teres Major    Pectoralis Major 1 1  Pectoralis Minor     Anterior Deltoid 1 2  Lateral Deltoid 1 2  Posterior Deltoid 1 1  Latissimus Dorsi    Sternocleidomastoid    (Blank rows = not tested) Graded on 0-4 scale (0 = no pain, 1 = pain, 2 = pain with wincing/grimacing/flinching, 3 = pain with withdrawal, 4 = unwilling to allow palpation), (Blank rows = not tested)   PASSIVE ACCESSORIES Glenohumeral: inferior glide WNL, posterior glide moderate restriction  Scapulothoracic: upward glide moderate restriction, posterior tilt mild restriction   SPECIAL TESTS  Bear Hug: R Negative, L Negative  Rotator Cuff  Drop Arm Test: R Negative, L Negative Painful Arc (Pain from 60 to 120 degrees scaption): R Positive, L Positive External Rotation Resistance: R Posiitve, L Negative    Bicep Tendon Pathology Speed (shoulder flexion to 90, external rotation, full elbow extension, and forearm supination with resistance: R Mild pain, L Positive (significant pain) Yergason's (resisted shoulder ER and supination/biceps tendon pathology): R Negative, L Positive for mild shoulder pain      TODAY'S TREATMENT 12/12/2023   SUBJECTIVE STATEMENT:   Patient reports having notable pain with  low back bothering her also. She reports 7/10 pain with R>L shoulder bothering her.     Manual Therapy - for symptom modulation, soft tissue sensitivity and mobility, joint mobility, ROM   Bolster under knees to improve back pain with supine lying: L shoulder PROM within pt tolerance within pt tolerance ; x 3 min Glenohumeral joint mobilizations; gr II for A-P glide; 2 x 30 sec bouts; gr III for inferior glide; 3 x 30 sec bouts STM L biceps and anterior/lateral deltoid; x 3 min   *not today* Scapular mobilization; 2 x 30 sec bouts for posterior tilt and upward rotation  GHJ mob gr III for posterior glide; 2 x 30 sec bouts    Therapeutic Exercise - for improved soft tissue flexibility and extensibility as needed for ROM, improved strength as needed to improve  performance of CKC activities/functional movements   Latex-free Theraband*  Tband high row for shoulder flexion AAROM during eccentric phase; 2 x 10  Bilateral shoulder flexion reactive isometric, Red Tband 20x L shoulder IR reactive isometric walkout; Red Tband; 2x10  Red Tband bilateral ER; 2 x 10  PATIENT EDUCATION: Encouraged continued HEP with Tbands (non-latex) given for home. [facility] handout updated.    *not today* Serratus punch with 3-lb Dbell; 1x15, LUE only Serratus punch with dowel; 2 x 10 Dowel AAROM shoulder flexion in supine; 20x    Cold pack (unbilled) - for anti-inflammatory and analgesic effect as needed for reduced pain and improved ability to participate in active PT intervention, along bilat shoulders in sitting, x 5 minutes   PATIENT EDUCATION:  Education details: see above for patient education details Person educated: Patient Education method: Explanation, Demonstration, and Handouts Education comprehension: verbalized understanding and returned demonstration   HOME EXERCISE PROGRAM:  Access Code: [email or web address] URL: [email or web address] Date: 12/07/2023 Prepared by: Denese Finn  Exercises - Isometric Shoulder Flexion at Wall  - 2 x daily - 7 x weekly - 2 sets - 10 reps - 5sec hold - Standing Isometric Shoulder Internal Rotation at Doorway  - 2 x daily - 7 x weekly - 2 sets - 10 reps - 5sec hold - Standing Isometric Shoulder External Rotation with Doorway  - 2 x daily - 7 x weekly - 2 sets - 10 reps - 5sec hold - Standing Shoulder Flexion Reactive Isometrics with Elbow Extended  - 1 x daily - 7 x weekly - 2 sets - 10 reps - Shoulder Internal Rotation Reactive Isometrics  - 1 x daily - 7 x weekly - 2 sets - 10 reps    ASSESSMENT:  CLINICAL IMPRESSION: Patient has ongoing incremental improvements with L shoulder passive motion. Active motion is gradually improving. She is more limited with tolerance of forward flexion and reaching  out with R upper limb following arthroscopy in previous year and difficulty with forward progress during post-op PT. Pt's home program was updated to include reactive isometrics to promote tendon remodeling/healing for bilateral LHB and RTC. Pt has bilateral deficits in shoulder A/PROM, RTC and deltoid strength, anterior shoulder/upper arm pain, L upper limb paresthesias intermittently. Pt will continue to benefit from skilled PT services to address deficits and improve function.   OBJECTIVE IMPAIRMENTS: decreased ROM, decreased strength, hypomobility, impaired flexibility, impaired UE functional use, postural dysfunction, and pain.   ACTIVITY LIMITATIONS: carrying, lifting, bathing, dressing, reach over head, and hygiene/grooming  PARTICIPATION LIMITATIONS: meal prep, cleaning, laundry, driving, shopping, and community activity  PERSONAL FACTORS: Past/current experiences, Time since onset of injury/illness/exacerbation, and 3+ comorbidities: (Asthma,  HTN, hx of L RCR, and hx of R shoulder arthroscopy with poor progress)  are also affecting patient's functional outcome.   REHAB POTENTIAL: Fair Hx of R shoulder arthroscopy with biceps tenodesis, bursectomy, debridement with poor post-op rehab progress  CLINICAL DECISION MAKING: Unstable/unpredictable  EVALUATION COMPLEXITY: High   GOALS: Goals reviewed with patient? Yes  SHORT TERM GOALS: Target date: 12/14/2023  Pt will be independent with HEP to improve strength and decrease shoulder pain to improve pain-free function at home and work. Baseline: 11/23/23: Baseline HEP reviewed, 3-way shoulder isometrics (see [facility] program).  Goal status: INITIAL   LONG TERM GOALS: Target date: 01/04/2024  Pt will have forward elevation of bilateral shoulders to 120 deg or greater as needed for self-care and ADLs Baseline: 11/23/23: Forward flexion AROM R 98, L 110 Goal status: INITIAL  2.  Pt will decrease worst shoulder pain by at least 3 points on  the NPRS in order to demonstrate clinically significant reduction in shoulder pain. Baseline: 11/23/23: 10/10 at worst Goal status: INITIAL  3.  Pt will decrease quick DASH score to less than or equal to 20% in order to demonstrate clinically significant reduction in disability related to shoulder pain    Baseline: 11/23/23: 84% Goal status: INITIAL  4. Pt will increase strength by to 4+/5 or greater MMT grade for assessed shoulder musculature in order to demonstrate improvement in strength and function      Baseline: 11/23/23: Strength 4- to 4 with pain during testing. Goal status: INITIAL  5. Pt will achieve functional ER to ipsilateral scapula and functional IR to bra line as needed for dressing, self-care ADLs     Baseline: 11/23/23: Limited functional ER bilat (see chart above for hand behind head/back) Goal status: INITIAL  PLAN: PT FREQUENCY: 1-2x/week  PT DURATION: 6 weeks  PLANNED INTERVENTIONS: Therapeutic exercises, Therapeutic activity, Neuromuscular re-education, Balance training, Gait training, Patient/Family education, Self Care, Joint mobilization, Joint manipulation, Vestibular training, Canalith repositioning, Orthotic/Fit training, DME instructions, Dry Needling, Electrical stimulation, Spinal manipulation, Spinal mobilization, Cryotherapy, Moist heat, Taping, Traction, Ultrasound, Ionotophoresis 4mg /ml Dexamethasone, Manual therapy, and Re-evaluation.  PLAN FOR NEXT SESSION:  Continue PT with supine gentle AAROM within tolerable ROM and table slides. RTC and LHB isometrics. Progress A/AAROM as tolerated.    Denese Finn, PT, DPT #[id number]  Aleatha Hunting, PT 12/12/2023, 8:25 AM

## 2023-12-12 NOTE — Therapy (Deleted)
 OUTPATIENT PHYSICAL THERAPY SHOULDER TREATMENT  Patient Name: Anita Mendoza MRN: [id number] DOB:[birth year], 53 y.o., female Today's Date: 12/12/2023  END OF SESSION:    Past Medical History:  Diagnosis Date   Asthma 2014   Hypertension    Past Surgical History:  Procedure Laterality Date   ABDOMINAL HYSTERECTOMY     carpul tunnel Bilateral 2011   choleycystectomy N/A 2000   ROTATOR CUFF REPAIR Left 2012   TONSILLECTOMY N/A 1979   Patient Active Problem List   Diagnosis Date Noted   Pain in right shoulder 05/01/2023    PCP: Neda Balk, MD  REFERRING PROVIDER: Rozann Cornell, PA-C  REFERRING DIAG:  [phone] (ICD-10-CM) - Pain in left shoulder  G89.29 (ICD-10-CM) - Other chronic pain  M67.814 (ICD-10-CM) - Other specified disorders of tendon, left shoulder    RATIONALE FOR EVALUATION AND TREATMENT: Rehabilitation  THERAPY DIAG: Left shoulder pain, unspecified chronicity  Stiffness of left shoulder, not elsewhere classified  Muscle weakness (generalized)  ONSET DATE: October 2024  FOLLOW-UP APPT SCHEDULED WITH REFERRING PROVIDER: None currently scheduled in EMR  PERTINENT HISTORY:  Pt is a 53 year old female known to this clinic - currently referred for L shoulder pain. Hx of R shoulder arthroscopy 10/13/22 with biceps tenodesis with prolonged rehab process due to persistent pain. Hx of L RCR in 2012.   Pt is remaining deficits with her R shoulder that were present during her previous episode of care. Pt reports pain affecting biceps region on both sides. Past med Hx of HTN, asthma. Pt needs help for dressing and pays for getting hair done.   Patient reports pain at night and disturbed sleep secondary to shoulder pain. She reports numbness/tingling that can go down to L wrist; some remote history of neck pain. Pt massages area with CBD cream, and she feels that his helps along upper quarter with her pain.    PAIN:  Pain Intensity: Present: 7/10,  Best: 5/10, Worst: 9/10 Pain location: L>R shoulder, biceps mm belly, anterior shoulder Pain Quality:  electric, "running" pain down L upper limb, numbness/tingling    Radiating: Yes , down to L arm/forearm and as far as wrist Numbness/Tingling: Yes; down L upper limb  Focal Weakness: Yes; unable to hold heavy items in either upper limb  Aggravating factors: reaching upward, lifting heavier items, dressing activities  Relieving factors: CBD cream, not using arms as much;  24-hour pain behavior: worse in AM  History of prior shoulder or neck/shoulder injury, pain, surgery, or therapy: Yes; L RCR in 2012, R shoulder arthroscopy with biceps tenodesis/SAD in 09/2022 Falls: Has patient fallen in last 6 months? No, Number of falls: N/A Dominant hand: right Imaging: Yes ;  IMPRESSION:  Left shoulder: 1. Moderate supraspinatus tendinosis with fraying of bursal surface fibers. Mild subacromial subdeltoid bursal distention. 2. Severe subscapularis tendinosis with a high-grade, partial-thickness tear of the subscapularis tendon. there is a severe subscapularis muscle atrophy with fatty infiltration. 3. Mild acromioclavicular osteoarthrosis. 4. Moderate biceps tendinosis.    Red flags (personal history of cancer, chills/fever, night sweats, nausea, vomiting, unrelenting pain, unexplained weight gain/loss): Negative  PRECAUTIONS: Latex allergy  WEIGHT BEARING RESTRICTIONS: No  FALLS: Has patient fallen in last 6 months? 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  Recent memory is intact.  Remote memory is intact.  Attention span and concentration are intact.  Expressive speech is intact.  Patient's fund of knowledge is within normal limits for educational level.    Gross Musculoskeletal Assessment Tremor: None Bulk: Normal Tone: Normal   Posture Rounded shoulders, mild protracted scapulae at rest  Cervical Screen* AROM: WFL and painless with overpressure in all planes Spurlings A (ipsilateral lateral flexion/axial compression): R: Negative L: Negative Distraction: Negative for alleviation/change in concordant pain  Hoffman Sign (cervical cord compression): R: Negative L: Negative   AROM AROM (Normal range in degrees) AROM 11/28/23   Right Left  Shoulder    Flexion 98 110  Extension    Abduction 136 80  External Rotation (arm at side) 82 64  Internal Rotation    Hands Behind Head C6 L upper trap  Hands Behind Back T12 L1      Elbow    Flexion WNL WNL  Extension WNL WNL  Pronation WNL WNL  Supination WNL WNL  (* = pain; Blank rows = not tested)  UE MMT: MMT (out of 5) Right Left   Cervical (isometric)  Flexion WNL  Extension WNL  Lateral Flexion WNL WNL  Rotation WNL WNL      Shoulder   Flexion 4-* 4-*  Extension    Abduction 4* 4*  External rotation 4* 4  Internal rotation 4* 4*  Horizontal abduction    Horizontal adduction    Lower Trapezius    Rhomboids        (* = pain; Blank rows = not tested)   Sensation Deferred  Reflexes Deferred  Palpation Location LEFT  RIGHT           Subocciptials    Cervical paraspinals    Upper Trapezius    Levator Scapulae    Rhomboid Major/Minor    Sternoclavicular joint    Acromioclavicular joint 1 1  Coracoid process 1 1  Long head of biceps 2 2  Supraspinatus 1 1  Infraspinatus    Subscapularis    Teres Minor    Teres Major    Pectoralis Major 1 1  Pectoralis Minor     Anterior Deltoid 1 2  Lateral Deltoid 1 2  Posterior Deltoid 1 1  Latissimus Dorsi    Sternocleidomastoid    (Blank rows = not tested) Graded on 0-4 scale (0 = no pain, 1 = pain, 2 = pain with wincing/grimacing/flinching, 3 = pain with withdrawal, 4 = unwilling to allow palpation), (Blank rows = not tested)   PASSIVE ACCESSORIES Glenohumeral: inferior glide WNL, posterior glide moderate restriction  Scapulothoracic: upward glide moderate restriction, posterior tilt mild restriction   SPECIAL TESTS  Bear Hug: R Negative, L Negative  Rotator Cuff  Drop Arm Test: R Negative, L Negative Painful Arc (Pain from 60 to 120 degrees scaption): R Positive, L Positive External Rotation Resistance: R Posiitve, L Negative    Bicep Tendon Pathology Speed (shoulder flexion to 90, external rotation, full elbow extension, and forearm supination with resistance: R Mild pain, L Positive (significant pain) Yergason's (resisted shoulder ER and supination/biceps tendon pathology): R Negative, L Positive for mild shoulder pain      TODAY'S TREATMENT 12/12/2023   SUBJECTIVE STATEMENT:   Patient reports having notable pain with  low back bothering her also. She reports 7/10 pain with R>L shoulder bothering her.     Manual Therapy - for symptom modulation, soft tissue sensitivity and mobility, joint mobility, ROM   Bolster under knees to improve back pain with supine lying: L shoulder PROM within pt tolerance within pt tolerance ; x 3 min Glenohumeral joint mobilizations; gr II for A-P glide; 2 x 30 sec bouts; gr III for inferior glide; 3 x 30 sec bouts STM L biceps and anterior/lateral deltoid; x 3 min   *not today* Scapular mobilization; 2 x 30 sec bouts for posterior tilt and upward rotation  GHJ mob gr III for posterior glide; 2 x 30 sec bouts    Therapeutic Exercise - for improved soft tissue flexibility and extensibility as needed for ROM, improved strength as needed to improve  performance of CKC activities/functional movements   Latex-free Theraband*  Tband high row for shoulder flexion AAROM during eccentric phase; 2 x 10  Bilateral shoulder flexion reactive isometric, Red Tband 20x L shoulder IR reactive isometric walkout; Red Tband; 2x10  Red Tband bilateral ER; 2 x 10  PATIENT EDUCATION: Encouraged continued HEP with Tbands (non-latex) given for home. [facility] handout updated.    *not today* Serratus punch with 3-lb Dbell; 1x15, LUE only Serratus punch with dowel; 2 x 10 Dowel AAROM shoulder flexion in supine; 20x    Cold pack (unbilled) - for anti-inflammatory and analgesic effect as needed for reduced pain and improved ability to participate in active PT intervention, along bilat shoulders in sitting, x 5 minutes   PATIENT EDUCATION:  Education details: see above for patient education details Person educated: Patient Education method: Explanation, Demonstration, and Handouts Education comprehension: verbalized understanding and returned demonstration   HOME EXERCISE PROGRAM:  Access Code: [email or web address] URL: [email or web address] Date: 12/07/2023 Prepared by: Denese Finn  Exercises - Isometric Shoulder Flexion at Wall  - 2 x daily - 7 x weekly - 2 sets - 10 reps - 5sec hold - Standing Isometric Shoulder Internal Rotation at Doorway  - 2 x daily - 7 x weekly - 2 sets - 10 reps - 5sec hold - Standing Isometric Shoulder External Rotation with Doorway  - 2 x daily - 7 x weekly - 2 sets - 10 reps - 5sec hold - Standing Shoulder Flexion Reactive Isometrics with Elbow Extended  - 1 x daily - 7 x weekly - 2 sets - 10 reps - Shoulder Internal Rotation Reactive Isometrics  - 1 x daily - 7 x weekly - 2 sets - 10 reps    ASSESSMENT:  CLINICAL IMPRESSION: Patient has ongoing incremental improvements with L shoulder passive motion. Active motion is gradually improving. She is more limited with tolerance of forward flexion and reaching  out with R upper limb following arthroscopy in previous year and difficulty with forward progress during post-op PT. Pt's home program was updated to include reactive isometrics to promote tendon remodeling/healing for bilateral LHB and RTC. Pt has bilateral deficits in shoulder A/PROM, RTC and deltoid strength, anterior shoulder/upper arm pain, L upper limb paresthesias intermittently. Pt will continue to benefit from skilled PT services to address deficits and improve function.   OBJECTIVE IMPAIRMENTS: decreased ROM, decreased strength, hypomobility, impaired flexibility, impaired UE functional use, postural dysfunction, and pain.   ACTIVITY LIMITATIONS: carrying, lifting, bathing, dressing, reach over head, and hygiene/grooming  PARTICIPATION LIMITATIONS: meal prep, cleaning, laundry, driving, shopping, and community activity  PERSONAL FACTORS: Past/current experiences, Time since onset of injury/illness/exacerbation, and 3+ comorbidities: (Asthma,  HTN, hx of L RCR, and hx of R shoulder arthroscopy with poor progress)  are also affecting patient's functional outcome.   REHAB POTENTIAL: Fair Hx of R shoulder arthroscopy with biceps tenodesis, bursectomy, debridement with poor post-op rehab progress  CLINICAL DECISION MAKING: Unstable/unpredictable  EVALUATION COMPLEXITY: High   GOALS: Goals reviewed with patient? Yes  SHORT TERM GOALS: Target date: 12/14/2023  Pt will be independent with HEP to improve strength and decrease shoulder pain to improve pain-free function at home and work. Baseline: 11/23/23: Baseline HEP reviewed, 3-way shoulder isometrics (see [facility] program).  Goal status: INITIAL   LONG TERM GOALS: Target date: 01/04/2024  Pt will have forward elevation of bilateral shoulders to 120 deg or greater as needed for self-care and ADLs Baseline: 11/23/23: Forward flexion AROM R 98, L 110 Goal status: INITIAL  2.  Pt will decrease worst shoulder pain by at least 3 points on  the NPRS in order to demonstrate clinically significant reduction in shoulder pain. Baseline: 11/23/23: 10/10 at worst Goal status: INITIAL  3.  Pt will decrease quick DASH score to less than or equal to 20% in order to demonstrate clinically significant reduction in disability related to shoulder pain    Baseline: 11/23/23: 84% Goal status: INITIAL  4. Pt will increase strength by to 4+/5 or greater MMT grade for assessed shoulder musculature in order to demonstrate improvement in strength and function      Baseline: 11/23/23: Strength 4- to 4 with pain during testing. Goal status: INITIAL  5. Pt will achieve functional ER to ipsilateral scapula and functional IR to bra line as needed for dressing, self-care ADLs     Baseline: 11/23/23: Limited functional ER bilat (see chart above for hand behind head/back) Goal status: INITIAL  PLAN: PT FREQUENCY: 1-2x/week  PT DURATION: 6 weeks  PLANNED INTERVENTIONS: Therapeutic exercises, Therapeutic activity, Neuromuscular re-education, Balance training, Gait training, Patient/Family education, Self Care, Joint mobilization, Joint manipulation, Vestibular training, Canalith repositioning, Orthotic/Fit training, DME instructions, Dry Needling, Electrical stimulation, Spinal manipulation, Spinal mobilization, Cryotherapy, Moist heat, Taping, Traction, Ultrasound, Ionotophoresis 4mg /ml Dexamethasone, Manual therapy, and Re-evaluation.  PLAN FOR NEXT SESSION:  Continue PT with supine gentle AAROM within tolerable ROM and table slides. RTC and LHB isometrics. Progress A/AAROM as tolerated.    Denese Finn, PT, DPT #[id number]  Aleatha Hunting, PT 12/12/2023, 9:37 AM

## 2023-12-13 NOTE — Therapy (Deleted)
 OUTPATIENT PHYSICAL THERAPY SHOULDER TREATMENT  Patient Name: Anita Mendoza MRN: [id number] DOB:[birth year], 53 y.o., female Today's Date: 12/13/2023  END OF SESSION:    Past Medical History:  Diagnosis Date   Asthma 2014   Hypertension    Past Surgical History:  Procedure Laterality Date   ABDOMINAL HYSTERECTOMY     carpul tunnel Bilateral 2011   choleycystectomy N/A 2000   ROTATOR CUFF REPAIR Left 2012   TONSILLECTOMY N/A 1979   Patient Active Problem List   Diagnosis Date Noted   Pain in right shoulder 05/01/2023    PCP: Neda Balk, MD  REFERRING PROVIDER: Rozann Cornell, PA-C  REFERRING DIAG:  [phone] (ICD-10-CM) - Pain in left shoulder  G89.29 (ICD-10-CM) - Other chronic pain  M67.814 (ICD-10-CM) - Other specified disorders of tendon, left shoulder    RATIONALE FOR EVALUATION AND TREATMENT: Rehabilitation  THERAPY DIAG: Left shoulder pain, unspecified chronicity  Stiffness of left shoulder, not elsewhere classified  Muscle weakness (generalized)  ONSET DATE: October 2024  FOLLOW-UP APPT SCHEDULED WITH REFERRING PROVIDER: None currently scheduled in EMR  PERTINENT HISTORY:  Pt is a 53 year old female known to this clinic - currently referred for L shoulder pain. Hx of R shoulder arthroscopy 10/13/22 with biceps tenodesis with prolonged rehab process due to persistent pain. Hx of L RCR in 2012.   Pt is remaining deficits with her R shoulder that were present during her previous episode of care. Pt reports pain affecting biceps region on both sides. Past med Hx of HTN, asthma. Pt needs help for dressing and pays for getting hair done.   Patient reports pain at night and disturbed sleep secondary to shoulder pain. She reports numbness/tingling that can go down to L wrist; some remote history of neck pain. Pt massages area with CBD cream, and she feels that his helps along upper quarter with her pain.    PAIN:  Pain Intensity: Present: 7/10,  Best: 5/10, Worst: 9/10 Pain location: L>R shoulder, biceps mm belly, anterior shoulder Pain Quality:  electric, "running" pain down L upper limb, numbness/tingling    Radiating: Yes , down to L arm/forearm and as far as wrist Numbness/Tingling: Yes; down L upper limb  Focal Weakness: Yes; unable to hold heavy items in either upper limb  Aggravating factors: reaching upward, lifting heavier items, dressing activities  Relieving factors: CBD cream, not using arms as much;  24-hour pain behavior: worse in AM  History of prior shoulder or neck/shoulder injury, pain, surgery, or therapy: Yes; L RCR in 2012, R shoulder arthroscopy with biceps tenodesis/SAD in 09/2022 Falls: Has patient fallen in last 6 months? No, Number of falls: N/A Dominant hand: right Imaging: Yes ;  IMPRESSION:  Left shoulder: 1. Moderate supraspinatus tendinosis with fraying of bursal surface fibers. Mild subacromial subdeltoid bursal distention. 2. Severe subscapularis tendinosis with a high-grade, partial-thickness tear of the subscapularis tendon. there is a severe subscapularis muscle atrophy with fatty infiltration. 3. Mild acromioclavicular osteoarthrosis. 4. Moderate biceps tendinosis.    Red flags (personal history of cancer, chills/fever, night sweats, nausea, vomiting, unrelenting pain, unexplained weight gain/loss): Negative  PRECAUTIONS: Latex allergy  WEIGHT BEARING RESTRICTIONS: No  FALLS: Has patient fallen in last 6 months? 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  Recent memory is intact.  Remote memory is intact.  Attention span and concentration are intact.  Expressive speech is intact.  Patient's fund of knowledge is within normal limits for educational level.    Gross Musculoskeletal Assessment Tremor: None Bulk: Normal Tone: Normal   Posture Rounded shoulders, mild protracted scapulae at rest  Cervical Screen* AROM: WFL and painless with overpressure in all planes Spurlings A (ipsilateral lateral flexion/axial compression): R: Negative L: Negative Distraction: Negative for alleviation/change in concordant pain  Hoffman Sign (cervical cord compression): R: Negative L: Negative   AROM AROM (Normal range in degrees) AROM 11/28/23   Right Left  Shoulder    Flexion 98 110  Extension    Abduction 136 80  External Rotation (arm at side) 82 64  Internal Rotation    Hands Behind Head C6 L upper trap  Hands Behind Back T12 L1      Elbow    Flexion WNL WNL  Extension WNL WNL  Pronation WNL WNL  Supination WNL WNL  (* = pain; Blank rows = not tested)  UE MMT: MMT (out of 5) Right Left   Cervical (isometric)  Flexion WNL  Extension WNL  Lateral Flexion WNL WNL  Rotation WNL WNL      Shoulder   Flexion 4-* 4-*  Extension    Abduction 4* 4*  External rotation 4* 4  Internal rotation 4* 4*  Horizontal abduction    Horizontal adduction    Lower Trapezius    Rhomboids        (* = pain; Blank rows = not tested)   Sensation Deferred  Reflexes Deferred  Palpation Location LEFT  RIGHT           Subocciptials    Cervical paraspinals    Upper Trapezius    Levator Scapulae    Rhomboid Major/Minor    Sternoclavicular joint    Acromioclavicular joint 1 1  Coracoid process 1 1  Long head of biceps 2 2  Supraspinatus 1 1  Infraspinatus    Subscapularis    Teres Minor    Teres Major    Pectoralis Major 1 1  Pectoralis Minor     Anterior Deltoid 1 2  Lateral Deltoid 1 2  Posterior Deltoid 1 1  Latissimus Dorsi    Sternocleidomastoid    (Blank rows = not tested) Graded on 0-4 scale (0 = no pain, 1 = pain, 2 = pain with wincing/grimacing/flinching, 3 = pain with withdrawal, 4 = unwilling to allow palpation), (Blank rows = not tested)   PASSIVE ACCESSORIES Glenohumeral: inferior glide WNL, posterior glide moderate restriction  Scapulothoracic: upward glide moderate restriction, posterior tilt mild restriction   SPECIAL TESTS  Bear Hug: R Negative, L Negative  Rotator Cuff  Drop Arm Test: R Negative, L Negative Painful Arc (Pain from 60 to 120 degrees scaption): R Positive, L Positive External Rotation Resistance: R Posiitve, L Negative    Bicep Tendon Pathology Speed (shoulder flexion to 90, external rotation, full elbow extension, and forearm supination with resistance: R Mild pain, L Positive (significant pain) Yergason's (resisted shoulder ER and supination/biceps tendon pathology): R Negative, L Positive for mild shoulder pain      TODAY'S TREATMENT 12/13/2023   SUBJECTIVE STATEMENT:   Patient reports having notable pain with  low back bothering her also. She reports 7/10 pain with R>L shoulder bothering her.     Manual Therapy - for symptom modulation, soft tissue sensitivity and mobility, joint mobility, ROM   Bolster under knees to improve back pain with supine lying: L shoulder PROM within pt tolerance within pt tolerance ; x 3 min Glenohumeral joint mobilizations; gr II for A-P glide; 2 x 30 sec bouts; gr III for inferior glide; 3 x 30 sec bouts STM L biceps and anterior/lateral deltoid; x 3 min   *not today* Scapular mobilization; 2 x 30 sec bouts for posterior tilt and upward rotation  GHJ mob gr III for posterior glide; 2 x 30 sec bouts    Therapeutic Exercise - for improved soft tissue flexibility and extensibility as needed for ROM, improved strength as needed to improve  performance of CKC activities/functional movements   Latex-free Theraband*  Tband high row for shoulder flexion AAROM during eccentric phase; 2 x 10  Bilateral shoulder flexion reactive isometric, Red Tband 20x L shoulder IR reactive isometric walkout; Red Tband; 2x10  Red Tband bilateral ER; 2 x 10  PATIENT EDUCATION: Encouraged continued HEP with Tbands (non-latex) given for home. [facility] handout updated.    *not today* Serratus punch with 3-lb Dbell; 1x15, LUE only Serratus punch with dowel; 2 x 10 Dowel AAROM shoulder flexion in supine; 20x    Cold pack (unbilled) - for anti-inflammatory and analgesic effect as needed for reduced pain and improved ability to participate in active PT intervention, along bilat shoulders in sitting, x 5 minutes   PATIENT EDUCATION:  Education details: see above for patient education details Person educated: Patient Education method: Explanation, Demonstration, and Handouts Education comprehension: verbalized understanding and returned demonstration   HOME EXERCISE PROGRAM:  Access Code: [email or web address] URL: [email or web address] Date: 12/07/2023 Prepared by: Denese Finn  Exercises - Isometric Shoulder Flexion at Wall  - 2 x daily - 7 x weekly - 2 sets - 10 reps - 5sec hold - Standing Isometric Shoulder Internal Rotation at Doorway  - 2 x daily - 7 x weekly - 2 sets - 10 reps - 5sec hold - Standing Isometric Shoulder External Rotation with Doorway  - 2 x daily - 7 x weekly - 2 sets - 10 reps - 5sec hold - Standing Shoulder Flexion Reactive Isometrics with Elbow Extended  - 1 x daily - 7 x weekly - 2 sets - 10 reps - Shoulder Internal Rotation Reactive Isometrics  - 1 x daily - 7 x weekly - 2 sets - 10 reps    ASSESSMENT:  CLINICAL IMPRESSION: Patient has ongoing incremental improvements with L shoulder passive motion. Active motion is gradually improving. She is more limited with tolerance of forward flexion and reaching  out with R upper limb following arthroscopy in previous year and difficulty with forward progress during post-op PT. Pt's home program was updated to include reactive isometrics to promote tendon remodeling/healing for bilateral LHB and RTC. Pt has bilateral deficits in shoulder A/PROM, RTC and deltoid strength, anterior shoulder/upper arm pain, L upper limb paresthesias intermittently. Pt will continue to benefit from skilled PT services to address deficits and improve function.   OBJECTIVE IMPAIRMENTS: decreased ROM, decreased strength, hypomobility, impaired flexibility, impaired UE functional use, postural dysfunction, and pain.   ACTIVITY LIMITATIONS: carrying, lifting, bathing, dressing, reach over head, and hygiene/grooming  PARTICIPATION LIMITATIONS: meal prep, cleaning, laundry, driving, shopping, and community activity  PERSONAL FACTORS: Past/current experiences, Time since onset of injury/illness/exacerbation, and 3+ comorbidities: (Asthma,  HTN, hx of L RCR, and hx of R shoulder arthroscopy with poor progress)  are also affecting patient's functional outcome.   REHAB POTENTIAL: Fair Hx of R shoulder arthroscopy with biceps tenodesis, bursectomy, debridement with poor post-op rehab progress  CLINICAL DECISION MAKING: Unstable/unpredictable  EVALUATION COMPLEXITY: High   GOALS: Goals reviewed with patient? Yes  SHORT TERM GOALS: Target date: 12/14/2023  Pt will be independent with HEP to improve strength and decrease shoulder pain to improve pain-free function at home and work. Baseline: 11/23/23: Baseline HEP reviewed, 3-way shoulder isometrics (see [facility] program).  Goal status: INITIAL   LONG TERM GOALS: Target date: 01/04/2024  Pt will have forward elevation of bilateral shoulders to 120 deg or greater as needed for self-care and ADLs Baseline: 11/23/23: Forward flexion AROM R 98, L 110 Goal status: INITIAL  2.  Pt will decrease worst shoulder pain by at least 3 points on  the NPRS in order to demonstrate clinically significant reduction in shoulder pain. Baseline: 11/23/23: 10/10 at worst Goal status: INITIAL  3.  Pt will decrease quick DASH score to less than or equal to 20% in order to demonstrate clinically significant reduction in disability related to shoulder pain    Baseline: 11/23/23: 84% Goal status: INITIAL  4. Pt will increase strength by to 4+/5 or greater MMT grade for assessed shoulder musculature in order to demonstrate improvement in strength and function      Baseline: 11/23/23: Strength 4- to 4 with pain during testing. Goal status: INITIAL  5. Pt will achieve functional ER to ipsilateral scapula and functional IR to bra line as needed for dressing, self-care ADLs     Baseline: 11/23/23: Limited functional ER bilat (see chart above for hand behind head/back) Goal status: INITIAL  PLAN: PT FREQUENCY: 1-2x/week  PT DURATION: 6 weeks  PLANNED INTERVENTIONS: Therapeutic exercises, Therapeutic activity, Neuromuscular re-education, Balance training, Gait training, Patient/Family education, Self Care, Joint mobilization, Joint manipulation, Vestibular training, Canalith repositioning, Orthotic/Fit training, DME instructions, Dry Needling, Electrical stimulation, Spinal manipulation, Spinal mobilization, Cryotherapy, Moist heat, Taping, Traction, Ultrasound, Ionotophoresis 4mg /ml Dexamethasone, Manual therapy, and Re-evaluation.  PLAN FOR NEXT SESSION:  Continue PT with supine gentle AAROM within tolerable ROM and table slides. RTC and LHB isometrics. Progress A/AAROM as tolerated.    Denese Finn, PT, DPT #[id number]  Aleatha Hunting, PT 12/13/2023, 4:51 PM

## 2023-12-14 ENCOUNTER — Ambulatory Visit: Attending: Orthopedic Surgery | Admitting: Physical Therapy

## 2023-12-14 DIAGNOSIS — M25612 Stiffness of left shoulder, not elsewhere classified: Secondary | ICD-10-CM | POA: Insufficient documentation

## 2023-12-14 DIAGNOSIS — M25512 Pain in left shoulder: Secondary | ICD-10-CM | POA: Insufficient documentation

## 2023-12-14 DIAGNOSIS — M6281 Muscle weakness (generalized): Secondary | ICD-10-CM | POA: Insufficient documentation

## 2023-12-18 NOTE — Therapy (Unsigned)
 OUTPATIENT PHYSICAL THERAPY SHOULDER TREATMENT  Patient Name: Anita Mendoza MRN: [id number] DOB:[birth year], 53 y.o., female Today's Date: 12/19/2023  END OF SESSION:  PT End of Session - 12/19/23 1035     Visit Number 5    Number of Visits 13    Date for PT Re-Evaluation 01/04/24    Authorization Type [employer] 2025    PT Start Time 1032    PT Stop Time 1119    PT Time Calculation (min) 47 min    Activity Tolerance Patient tolerated treatment well    Behavior During Therapy WFL for tasks assessed/performed              Past Medical History:  Diagnosis Date   Asthma 2014   Hypertension    Past Surgical History:  Procedure Laterality Date   ABDOMINAL HYSTERECTOMY     carpul tunnel Bilateral 2011   choleycystectomy N/A 2000   ROTATOR CUFF REPAIR Left 2012   TONSILLECTOMY N/A 1979   Patient Active Problem List   Diagnosis Date Noted   Pain in right shoulder 05/01/2023    PCP: Neda Balk, MD  REFERRING PROVIDER: Rozann Cornell, PA-C  REFERRING DIAG:  [phone] (ICD-10-CM) - Pain in left shoulder  G89.29 (ICD-10-CM) - Other chronic pain  M67.814 (ICD-10-CM) - Other specified disorders of tendon, left shoulder    RATIONALE FOR EVALUATION AND TREATMENT: Rehabilitation  THERAPY DIAG: Left shoulder pain, unspecified chronicity  Stiffness of left shoulder, not elsewhere classified  Muscle weakness (generalized)  ONSET DATE: October 2024  FOLLOW-UP APPT SCHEDULED WITH REFERRING PROVIDER: None currently scheduled in EMR  PERTINENT HISTORY:  Pt is a 53 year old female known to this clinic - currently referred for L shoulder pain. Hx of R shoulder arthroscopy 10/13/22 with biceps tenodesis with prolonged rehab process due to persistent pain. Hx of L RCR in 2012.   Pt is remaining deficits with her R shoulder that were present during her previous episode of care. Pt reports pain affecting biceps region on both sides. Past med Hx of HTN,  asthma. Pt needs help for dressing and pays for getting hair done.   Patient reports pain at night and disturbed sleep secondary to shoulder pain. She reports numbness/tingling that can go down to L wrist; some remote history of neck pain. Pt massages area with CBD cream, and she feels that his helps along upper quarter with her pain.    PAIN:  Pain Intensity: Present: 7/10, Best: 5/10, Worst: 9/10 Pain location: L>R shoulder, biceps mm belly, anterior shoulder Pain Quality:  electric, "running" pain down L upper limb, numbness/tingling    Radiating: Yes , down to L arm/forearm and as far as wrist Numbness/Tingling: Yes; down L upper limb  Focal Weakness: Yes; unable to hold heavy items in either upper limb  Aggravating factors: reaching upward, lifting heavier items, dressing activities  Relieving factors: CBD cream, not using arms as much;  24-hour pain behavior: worse in AM  History of prior shoulder or neck/shoulder injury, pain, surgery, or therapy: Yes; L RCR in 2012, R shoulder arthroscopy with biceps tenodesis/SAD in 09/2022 Falls: Has patient fallen in last 6 months? No, Number of falls: N/A Dominant hand: right Imaging: Yes ;  IMPRESSION:  Left shoulder: 1. Moderate supraspinatus tendinosis with fraying of bursal surface fibers. Mild subacromial subdeltoid bursal distention. 2. Severe subscapularis tendinosis with a high-grade, partial-thickness tear of the subscapularis tendon. there is a severe subscapularis muscle atrophy with fatty infiltration. 3. Mild acromioclavicular osteoarthrosis. 4. Moderate biceps  tendinosis.    Red flags (personal history of cancer, chills/fever, night sweats, nausea, vomiting, unrelenting pain, unexplained weight gain/loss): Negative  PRECAUTIONS: Latex allergy  WEIGHT BEARING RESTRICTIONS: No  FALLS: Has patient fallen in last 6 months? 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  Recent memory is intact.  Remote memory is intact.  Attention span and concentration are intact.  Expressive speech is intact.  Patient's fund of knowledge is within normal limits for educational level.    Gross Musculoskeletal Assessment Tremor: None Bulk: Normal Tone: Normal   Posture Rounded shoulders, mild protracted scapulae at rest  Cervical Screen* AROM: WFL and painless with overpressure in all planes Spurlings A (ipsilateral lateral flexion/axial compression): R: Negative L: Negative Distraction: Negative for alleviation/change in concordant pain  Hoffman Sign (cervical cord compression): R: Negative L: Negative   AROM AROM (Normal range in degrees) AROM 11/28/23   Right Left  Shoulder    Flexion 98 110  Extension    Abduction 136 80  External Rotation (arm at side) 82 64  Internal Rotation    Hands Behind Head C6 L upper trap  Hands Behind Back T12 L1      Elbow    Flexion WNL WNL  Extension WNL WNL  Pronation WNL WNL  Supination WNL WNL  (* = pain; Blank rows = not tested)  UE MMT: MMT (out of 5) Right Left   Cervical (isometric)  Flexion WNL  Extension WNL  Lateral Flexion WNL WNL  Rotation WNL WNL      Shoulder   Flexion 4-* 4-*  Extension    Abduction 4* 4*  External rotation 4* 4  Internal rotation 4* 4*  Horizontal abduction    Horizontal adduction    Lower Trapezius    Rhomboids        (* = pain; Blank rows = not  tested)   Sensation Deferred  Reflexes Deferred  Palpation Location LEFT  RIGHT           Subocciptials    Cervical paraspinals    Upper Trapezius    Levator Scapulae    Rhomboid Major/Minor    Sternoclavicular joint    Acromioclavicular joint 1 1  Coracoid process 1 1  Long head of biceps 2 2  Supraspinatus 1 1  Infraspinatus    Subscapularis    Teres Minor    Teres Major    Pectoralis Major 1 1  Pectoralis Minor    Anterior Deltoid 1 2  Lateral Deltoid 1 2  Posterior Deltoid 1 1  Latissimus Dorsi    Sternocleidomastoid    (Blank rows = not tested) Graded on 0-4 scale (0 = no pain, 1 = pain, 2 = pain with wincing/grimacing/flinching, 3 = pain with withdrawal, 4 = unwilling to allow palpation), (Blank rows = not tested)   PASSIVE ACCESSORIES Glenohumeral: inferior glide WNL, posterior glide moderate restriction  Scapulothoracic: upward glide moderate restriction, posterior tilt mild restriction   SPECIAL TESTS  Bear Hug: R Negative, L Negative  Rotator Cuff  Drop  Arm Test: R Negative, L Negative Painful Arc (Pain from 60 to 120 degrees scaption): R Positive, L Positive External Rotation Resistance: R Posiitve, L Negative    Bicep Tendon Pathology Speed (shoulder flexion to 90, external rotation, full elbow extension, and forearm supination with resistance: R Mild pain, L Positive (significant pain) Yergason's (resisted shoulder ER and supination/biceps tendon pathology): R Negative, L Positive for mild shoulder pain      TODAY'S TREATMENT 12/19/2023   SUBJECTIVE STATEMENT:   Patient reports mild pain affecting either shoulder at arrival to PT. Pt reports doing well with updated HEP. Patient reports she missed last week due to her husband having severe GI symptoms and having to go to urgent care and then to ED last week. 6/10 pain affecting her L shoulder.     Upper body ergometer, 2 minutes forward, 2 minutes backward - for tissue warm-up to improve  muscle performance, improved soft tissue mobility/extensibility - 1 min unbilled time    Manual Therapy - for symptom modulation, soft tissue sensitivity and mobility, joint mobility, ROM   In supine: L shoulder PROM within pt tolerance within pt tolerance ; x 3 min Glenohumeral joint mobilizations; gr III for inferior glide; 3 x 30 sec bouts Scapular mobilization; 2 x 30 sec bouts for posterior tilt, elevation, and upward rotation    *not today* STM L biceps and anterior/lateral deltoid; x 3 min GHJ mob gr III for posterior glide; 2 x 30 sec bouts    Therapeutic Exercise - for improved soft tissue flexibility and extensibility as needed for ROM, improved strength as needed to improve performance of CKC activities/functional movements   Latex-free Theraband*  Supine shoulder flexion with dowel; 1x10 Supine shoulder flexion AROM, bilat; 1 x 10  Serratus punch at well, CKC punch; 2 x 10  -some median n. N/T during this drill  Tband high row for shoulder flexion AAROM during eccentric phase, Red latex-free band (2nd hook from top); 2 x 10   Red Tband bilateral ER; 2 x 10  -back to wall for postural cue  Standing bilateral row with Green Tband; 2 x 10, 3 sec hold  -PT tactlie cueing and verbal cueing for scap retraction/depression   PATIENT EDUCATION: Encouraged continued HEP with Tbands (non-latex) given for home. Med[employer] handout updated.     *next visit* Reclined shoulder flexion A/AAROM  *not today* Bilateral shoulder flexion reactive isometric, Red Tband 20x L shoulder IR reactive isometric walkout; Red Tband; 2x10 Serratus punch with 3-lb Dbell; 1x15, LUE only Serratus punch with dowel; 2 x 10 Cold pack (unbilled) - for anti-inflammatory and analgesic effect as needed for reduced pain and improved ability to participate in active PT intervention, along bilat shoulders in sitting, x 5 minutes   PATIENT EDUCATION:  Education details: see above for patient  education details Person educated: Patient Education method: Explanation, Demonstration, and Handouts Education comprehension: verbalized understanding and returned demonstration   HOME EXERCISE PROGRAM:  Access Code: [email or web address] URL: [email or web address] Date: 12/19/2023 Prepared by: Denese Finn  Exercises - Standing Shoulder Flexion Reactive Isometrics with Elbow Extended  - 1 x daily - 7 x weekly - 2 sets - 10 reps - Shoulder Internal Rotation Reactive Isometrics  - 1 x daily - 7 x weekly - 2 sets - 10 reps - Shoulder External Rotation and Scapular Retraction with Resistance  - 1 x daily - 7 x weekly - 2 sets - 10 reps - Standing Shoulder Row with Anchored Resistance  - 1 x daily -  7 x weekly - 2 sets - 10 reps - 3sec hold      ASSESSMENT:  CLINICAL IMPRESSION: Patient exhibits markedly improved shoulder elevation ROM passively and with AAROM today. She has WNL ER/IR for L shoulder. Pt does have some paresthesias largely affecting median nerve distribution after higher volume of shoulder elevation ROM work and serratus punch activity. This is moderately improved after arms are in dependent position and pt completes opening/closing hands to promote blood flow. Pt tolerates additional periscapular and posterior cuff isotonics in office well today. Pt has bilateral deficits in shoulder A/PROM, RTC and deltoid strength, anterior shoulder/upper arm pain, L upper limb paresthesias intermittently. Pt will continue to benefit from skilled PT services to address deficits and improve function.   OBJECTIVE IMPAIRMENTS: decreased ROM, decreased strength, hypomobility, impaired flexibility, impaired UE functional use, postural dysfunction, and pain.   ACTIVITY LIMITATIONS: carrying, lifting, bathing, dressing, reach over head, and hygiene/grooming  PARTICIPATION LIMITATIONS: meal prep, cleaning, laundry, driving, shopping, and community activity  PERSONAL FACTORS: Past/current  experiences, Time since onset of injury/illness/exacerbation, and 3+ comorbidities: (Asthma, HTN, hx of L RCR, and hx of R shoulder arthroscopy with poor progress)  are also affecting patient's functional outcome.   REHAB POTENTIAL: Fair Hx of R shoulder arthroscopy with biceps tenodesis, bursectomy, debridement with poor post-op rehab progress  CLINICAL DECISION MAKING: Unstable/unpredictable  EVALUATION COMPLEXITY: High   GOALS: Goals reviewed with patient? Yes  SHORT TERM GOALS: Target date: 12/14/2023  Pt will be independent with HEP to improve strength and decrease shoulder pain to improve pain-free function at home and work. Baseline: 11/23/23: Baseline HEP reviewed, 3-way shoulder isometrics (see Med[employer] program).  Goal status: INITIAL   LONG TERM GOALS: Target date: 01/04/2024  Pt will have forward elevation of bilateral shoulders to 120 deg or greater as needed for self-care and ADLs Baseline: 11/23/23: Forward flexion AROM R 98, L 110 Goal status: INITIAL  2.  Pt will decrease worst shoulder pain by at least 3 points on the NPRS in order to demonstrate clinically significant reduction in shoulder pain. Baseline: 11/23/23: 10/10 at worst Goal status: INITIAL  3.  Pt will decrease quick DASH score to less than or equal to 20% in order to demonstrate clinically significant reduction in disability related to shoulder pain    Baseline: 11/23/23: 84% Goal status: INITIAL  4. Pt will increase strength by to 4+/5 or greater MMT grade for assessed shoulder musculature in order to demonstrate improvement in strength and function      Baseline: 11/23/23: Strength 4- to 4 with pain during testing. Goal status: INITIAL  5. Pt will achieve functional ER to ipsilateral scapula and functional IR to bra line as needed for dressing, self-care ADLs     Baseline: 11/23/23: Limited functional ER bilat (see chart above for hand behind head/back) Goal status: INITIAL  PLAN: PT FREQUENCY:  1-2x/week  PT DURATION: 6 weeks  PLANNED INTERVENTIONS: Therapeutic exercises, Therapeutic activity, Neuromuscular re-education, Balance training, Gait training, Patient/Family education, Self Care, Joint mobilization, Joint manipulation, Vestibular training, Canalith repositioning, Orthotic/Fit training, DME instructions, Dry Needling, Electrical stimulation, Spinal manipulation, Spinal mobilization, Cryotherapy, Moist heat, Taping, Traction, Ultrasound, Ionotophoresis 4mg /ml Dexamethasone, Manual therapy, and Re-evaluation.  PLAN FOR NEXT SESSION:  Continue PT with supine gentle AAROM within tolerable ROM. RTC and LHB isometrics with progression to isotonics. Progress A/AAROM as tolerated.    Denese Finn, PT, DPT #[id number]  Aleatha Hunting, PT 12/19/2023, 11:30 AM

## 2023-12-19 ENCOUNTER — Ambulatory Visit: Admitting: Physical Therapy

## 2023-12-19 DIAGNOSIS — M6281 Muscle weakness (generalized): Secondary | ICD-10-CM

## 2023-12-19 DIAGNOSIS — M25612 Stiffness of left shoulder, not elsewhere classified: Secondary | ICD-10-CM

## 2023-12-19 DIAGNOSIS — M25512 Pain in left shoulder: Secondary | ICD-10-CM

## 2023-12-21 ENCOUNTER — Encounter: Payer: Self-pay | Admitting: Physical Therapy

## 2023-12-21 ENCOUNTER — Ambulatory Visit: Admitting: Physical Therapy

## 2023-12-21 DIAGNOSIS — M25512 Pain in left shoulder: Secondary | ICD-10-CM | POA: Diagnosis not present

## 2023-12-21 DIAGNOSIS — M25612 Stiffness of left shoulder, not elsewhere classified: Secondary | ICD-10-CM

## 2023-12-21 DIAGNOSIS — M6281 Muscle weakness (generalized): Secondary | ICD-10-CM

## 2023-12-21 NOTE — Therapy (Signed)
 OUTPATIENT PHYSICAL THERAPY SHOULDER TREATMENT  Patient Name: Anita Mendoza MRN: [id number] DOB:09/19/1970, 53 y.o., female Today's Date: 12/21/2023  END OF SESSION:  PT End of Session - 12/21/23 1035     Visit Number 6    Number of Visits 13    Date for PT Re-Evaluation 01/04/24    Authorization Type [employer] 2025    PT Start Time 1031    PT Stop Time 1117    PT Time Calculation (min) 46 min    Activity Tolerance Patient tolerated treatment well    Behavior During Therapy WFL for tasks assessed/performed             Past Medical History:  Diagnosis Date   Asthma 2014   Hypertension    Past Surgical History:  Procedure Laterality Date   ABDOMINAL HYSTERECTOMY     carpul tunnel Bilateral 2011   choleycystectomy N/A 2000   ROTATOR CUFF REPAIR Left 2012   TONSILLECTOMY N/A 1979   Patient Active Problem List   Diagnosis Date Noted   Pain in right shoulder 05/01/2023    PCP: Neda Balk, MD  REFERRING PROVIDER: Rozann Cornell, PA-C  REFERRING DIAG:  [phone] (ICD-10-CM) - Pain in left shoulder  G89.29 (ICD-10-CM) - Other chronic pain  M67.814 (ICD-10-CM) - Other specified disorders of tendon, left shoulder    RATIONALE FOR EVALUATION AND TREATMENT: Rehabilitation  THERAPY DIAG: Left shoulder pain, unspecified chronicity  Stiffness of left shoulder, not elsewhere classified  Muscle weakness (generalized)  ONSET DATE: October 2024  FOLLOW-UP APPT SCHEDULED WITH REFERRING PROVIDER: None currently scheduled in EMR  PERTINENT HISTORY:  Pt is a 53 year old female known to this clinic - currently referred for L shoulder pain. Hx of R shoulder arthroscopy 10/13/22 with biceps tenodesis with prolonged rehab process due to persistent pain. Hx of L RCR in 2012.   Pt is remaining deficits with her R shoulder that were present during her previous episode of care. Pt reports pain affecting biceps region on both sides. Past med Hx of HTN, asthma.  Pt needs help for dressing and pays for getting hair done.   Patient reports pain at night and disturbed sleep secondary to shoulder pain. She reports numbness/tingling that can go down to L wrist; some remote history of neck pain. Pt massages area with CBD cream, and she feels that his helps along upper quarter with her pain.    PAIN:  Pain Intensity: Present: 7/10, Best: 5/10, Worst: 9/10 Pain location: L>R shoulder, biceps mm belly, anterior shoulder Pain Quality: electric, "running" pain down L upper limb, numbness/tingling   Radiating: Yes , down to L arm/forearm and as far as wrist Numbness/Tingling: Yes; down L upper limb  Focal Weakness: Yes; unable to hold heavy items in either upper limb  Aggravating factors: reaching upward, lifting heavier items, dressing activities  Relieving factors: CBD cream, not using arms as much;  24-hour pain behavior: worse in AM  History of prior shoulder or neck/shoulder injury, pain, surgery, or therapy: Yes; L RCR in 2012, R shoulder arthroscopy with biceps tenodesis/SAD in 09/2022 Falls: Has patient fallen in last 6 months? No, Number of falls: N/A Dominant hand: right Imaging: Yes ;  IMPRESSION:  Left shoulder: 1. Moderate supraspinatus tendinosis with fraying of bursal surface fibers. Mild subacromial subdeltoid bursal distention. 2. Severe subscapularis tendinosis with a high-grade, partial-thickness tear of the subscapularis tendon. there is a severe subscapularis muscle atrophy with fatty infiltration. 3. Mild acromioclavicular osteoarthrosis. 4. Moderate biceps tendinosis.  Red flags (personal history of cancer, chills/fever, night sweats, nausea, vomiting, unrelenting pain, unexplained weight gain/loss): Negative  PRECAUTIONS: Latex allergy  WEIGHT BEARING RESTRICTIONS: No  FALLS: Has patient fallen in last 6 months? 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  Recent memory is intact.  Remote memory is intact.  Attention span and concentration are intact.  Expressive speech is intact.  Patient's fund of knowledge is within normal limits for educational level.    Gross Musculoskeletal Assessment Tremor: None Bulk: Normal Tone: Normal   Posture Rounded shoulders, mild protracted scapulae at rest  Cervical Screen* AROM: WFL and painless with overpressure in all planes Spurlings A (ipsilateral lateral flexion/axial compression): R: Negative L: Negative Distraction: Negative for alleviation/change in concordant pain  Hoffman Sign (cervical cord compression): R: Negative L: Negative   AROM AROM (Normal range in degrees) AROM 11/28/23   Right Left  Shoulder    Flexion 98 110  Extension    Abduction 136 80  External Rotation (arm at side) 82 64  Internal Rotation    Hands Behind Head C6 L upper trap  Hands Behind Back T12 L1      Elbow    Flexion WNL WNL  Extension WNL WNL  Pronation WNL WNL  Supination WNL WNL  (* = pain; Blank rows = not tested)  UE MMT: MMT (out of 5) Right Left   Cervical (isometric)  Flexion WNL  Extension WNL  Lateral Flexion WNL WNL  Rotation WNL WNL      Shoulder   Flexion 4-* 4-*  Extension    Abduction 4* 4*  External rotation 4* 4  Internal rotation 4* 4*  Horizontal abduction    Horizontal adduction    Lower Trapezius    Rhomboids        (* = pain; Blank rows = not  tested)   Sensation Deferred  Reflexes Deferred  Palpation Location LEFT  RIGHT           Subocciptials    Cervical paraspinals    Upper Trapezius    Levator Scapulae    Rhomboid Major/Minor    Sternoclavicular joint    Acromioclavicular joint 1 1  Coracoid process 1 1  Long head of biceps 2 2  Supraspinatus 1 1  Infraspinatus    Subscapularis    Teres Minor    Teres Major    Pectoralis Major 1 1  Pectoralis Minor    Anterior Deltoid 1 2  Lateral Deltoid 1 2  Posterior Deltoid 1 1  Latissimus Dorsi    Sternocleidomastoid    (Blank rows = not tested) Graded on 0-4 scale (0 = no pain, 1 = pain, 2 = pain with wincing/grimacing/flinching, 3 = pain with withdrawal, 4 = unwilling to allow palpation), (Blank rows = not tested)   PASSIVE ACCESSORIES Glenohumeral: inferior glide WNL, posterior glide moderate restriction  Scapulothoracic: upward glide moderate restriction, posterior tilt mild restriction   SPECIAL TESTS  Bear Hug: R Negative, L Negative  Rotator Cuff  Drop Arm Test: R Negative, L  Negative Painful Arc (Pain from 60 to 120 degrees scaption): R Positive, L Positive External Rotation Resistance: R Posiitve, L Negative    Bicep Tendon Pathology Speed (shoulder flexion to 90, external rotation, full elbow extension, and forearm supination with resistance: R Mild pain, L Positive (significant pain) Yergason's (resisted shoulder ER and supination/biceps tendon pathology): R Negative, L Positive for mild shoulder pain      TODAY'S TREATMENT 12/21/2023   SUBJECTIVE STATEMENT:   Patient reports sleeping relatively well last night and doing okay this AM. She reports having significant discomfort Tuesday night and having a hard time sleeping then. She had to use massage tool on L shoulder and pillow behind it when lying down. 5/10 pain in L shoulder at arrival. 3/10 pain affecting R shoulder. She does not believe that she had issue with PT Tuesday morning.     Upper body ergometer, 2 minutes forward, 2 minutes backward - for tissue warm-up to improve muscle performance, improved soft tissue mobility/extensibility - 1 min unbilled time   Manual Therapy - for symptom modulation, soft tissue sensitivity and mobility, joint mobility, ROM   In supine: L shoulder PROM within pt tolerance within pt tolerance ; x 3 min Glenohumeral joint mobilizations; gr III for inferior glide; 3 x 30 sec bouts STM L biceps and anterior/lateral deltoid; x 3 min   *not today* Scapular mobilization; 2 x 30 sec bouts for posterior tilt, elevation, and upward rotation  GHJ mob gr III for posterior glide; 2 x 30 sec bouts    Therapeutic Exercise - for improved soft tissue flexibility and extensibility as needed for ROM, improved strength as needed to improve performance of CKC activities/functional movements   Latex-free Theraband*  Supine shoulder flexion with dowel; 1x10 Reclined shoulder flexion AAROM with dowel, bilat; 1 x 10 Reclined shoulder flexion AROM, bilat; 1 x 10  Tband high row for shoulder flexion AAROM during eccentric phase, Red latex-free band (2nd hook from top); 2 x 10   Standing adduction with Red Tband; ABD assist; 2 x 10 with LUE   PATIENT EDUCATION: Encouraged continued HEP with Tbands (non-latex) given for home.   Cold pack (unbilled) - for anti-inflammatory and analgesic effect as needed for reduced pain and improved ability to participate in active PT intervention, along L shoulder in sitting, x 5 minutes    *next visit* Standing bilateral row with Blue Tband; 2 x 10, 3 sec hold  *not today* Serratus punch at well, CKC punch; 2 x 10 Red Tband bilateral ER; 2 x 10 Bilateral shoulder flexion reactive isometric, Red Tband 20x L shoulder IR reactive isometric walkout; Red Tband; 2x10 Serratus punch with 3-lb Dbell; 1x15, LUE only Serratus punch with dowel; 2 x 10 Cold pack (unbilled) - for anti-inflammatory and analgesic  effect as needed for reduced pain and improved ability to participate in active PT intervention, along bilat shoulders in sitting, x 5 minutes   PATIENT EDUCATION:  Education details: see above for patient education details Person educated: Patient Education method: Explanation, Demonstration, and Handouts Education comprehension: verbalized understanding and returned demonstration   HOME EXERCISE PROGRAM:  Access Code: [email or web address] URL: [email or web address] Date: 12/19/2023 Prepared by: Denese Finn  Exercises - Standing Shoulder Flexion Reactive Isometrics with Elbow Extended  - 1 x daily - 7 x weekly - 2 sets - 10 reps - Shoulder Internal Rotation Reactive Isometrics  - 1 x daily - 7 x weekly - 2 sets - 10 reps - Shoulder External Rotation and Scapular Retraction with  Resistance  - 1 x daily - 7 x weekly - 2 sets - 10 reps - Standing Shoulder Row with Anchored Resistance  - 1 x daily - 7 x weekly - 2 sets - 10 reps - 3sec hold      ASSESSMENT:  CLINICAL IMPRESSION: Patient reports notable pain Tuesday evening and she had notable discomfort this AM in L anterior shoulder. She feels that it is not as severe this AM, but she does have difficulty tolerating passive and active-assisted forward elevation beyond shoulder height at baseline today. Pt is able to attain functional ROM in flexion, abduction, ER, and IR following manual therapy today. We modestly progressed with periscapular drills and AAROM work in standing today; pt does report some pain with resisted adduction drill. Pt has bilateral deficits in shoulder A/PROM, RTC and deltoid strength, anterior shoulder/upper arm pain, L upper limb paresthesias intermittently. Pt will continue to benefit from skilled PT services to address deficits and improve function.   OBJECTIVE IMPAIRMENTS: decreased ROM, decreased strength, hypomobility, impaired flexibility, impaired UE functional use, postural dysfunction, and pain.    ACTIVITY LIMITATIONS: carrying, lifting, bathing, dressing, reach over head, and hygiene/grooming  PARTICIPATION LIMITATIONS: meal prep, cleaning, laundry, driving, shopping, and community activity  PERSONAL FACTORS: Past/current experiences, Time since onset of injury/illness/exacerbation, and 3+ comorbidities: (Asthma, HTN, hx of L RCR, and hx of R shoulder arthroscopy with poor progress) are also affecting patient's functional outcome.   REHAB POTENTIAL: Fair Hx of R shoulder arthroscopy with biceps tenodesis, bursectomy, debridement with poor post-op rehab progress  CLINICAL DECISION MAKING: Unstable/unpredictable  EVALUATION COMPLEXITY: High   GOALS: Goals reviewed with patient? Yes  SHORT TERM GOALS: Target date: 12/14/2023  Pt will be independent with HEP to improve strength and decrease shoulder pain to improve pain-free function at home and work. Baseline: 11/23/23: Baseline HEP reviewed, 3-way shoulder isometrics (see [employer] program).  Goal status: INITIAL   LONG TERM GOALS: Target date: 01/04/2024  Pt will have forward elevation of bilateral shoulders to 120 deg or greater as needed for self-care and ADLs Baseline: 11/23/23: Forward flexion AROM R 98, L 110 Goal status: INITIAL  2.  Pt will decrease worst shoulder pain by at least 3 points on the NPRS in order to demonstrate clinically significant reduction in shoulder pain. Baseline: 11/23/23: 10/10 at worst Goal status: INITIAL  3.  Pt will decrease quick DASH score to less than or equal to 20% in order to demonstrate clinically significant reduction in disability related to shoulder pain    Baseline: 11/23/23: 84% Goal status: INITIAL  4. Pt will increase strength by to 4+/5 or greater MMT grade for assessed shoulder musculature in order to demonstrate improvement in strength and function      Baseline: 11/23/23: Strength 4- to 4 with pain during testing. Goal status: INITIAL  5. Pt will achieve functional ER to  ipsilateral scapula and functional IR to bra line as needed for dressing, self-care ADLs     Baseline: 11/23/23: Limited functional ER bilat (see chart above for hand behind head/back) Goal status: INITIAL  PLAN: PT FREQUENCY: 1-2x/week  PT DURATION: 6 weeks  PLANNED INTERVENTIONS: Therapeutic exercises, Therapeutic activity, Neuromuscular re-education, Balance training, Gait training, Patient/Family education, Self Care, Joint mobilization, Joint manipulation, Vestibular training, Canalith repositioning, Orthotic/Fit training, DME instructions, Dry Needling, Electrical stimulation, Spinal manipulation, Spinal mobilization, Cryotherapy, Moist heat, Taping, Traction, Ultrasound, Ionotophoresis 4mg /ml Dexamethasone, Manual therapy, and Re-evaluation.  PLAN FOR NEXT SESSION:  Continue PT with supine gentle AAROM within  tolerable ROM. RTC and LHB isometrics with progression to isotonics. Progress A/AAROM as tolerated.    Denese Finn, PT, DPT #[id number]  Anita Mendoza, PT 12/21/2023, 11:17 AM

## 2023-12-26 ENCOUNTER — Ambulatory Visit: Admitting: Physical Therapy

## 2023-12-28 ENCOUNTER — Ambulatory Visit: Admitting: Physical Therapy

## 2023-12-28 NOTE — Therapy (Deleted)
 OUTPATIENT PHYSICAL THERAPY SHOULDER TREATMENT  Patient Name: Anita Mendoza MRN: [id number] DOB:30-Oct-1970, 53 y.o., female Today's Date: 12/28/2023  END OF SESSION:    Past Medical History:  Diagnosis Date   Asthma 2014   Hypertension    Past Surgical History:  Procedure Laterality Date   ABDOMINAL HYSTERECTOMY     carpul tunnel Bilateral 2011   choleycystectomy N/A 2000   ROTATOR CUFF REPAIR Left 2012   TONSILLECTOMY N/A 1979   Patient Active Problem List   Diagnosis Date Noted   Pain in right shoulder 05/01/2023    PCP: Neda Balk, MD  REFERRING PROVIDER: Rozann Cornell, PA-C  REFERRING DIAG:  [phone] (ICD-10-CM) - Pain in left shoulder  G89.29 (ICD-10-CM) - Other chronic pain  M67.814 (ICD-10-CM) - Other specified disorders of tendon, left shoulder    RATIONALE FOR EVALUATION AND TREATMENT: Rehabilitation  THERAPY DIAG: Left shoulder pain, unspecified chronicity  Stiffness of left shoulder, not elsewhere classified  Muscle weakness (generalized)  ONSET DATE: October 2024  FOLLOW-UP APPT SCHEDULED WITH REFERRING PROVIDER: None currently scheduled in EMR  PERTINENT HISTORY:  Pt is a 54 year old female known to this clinic - currently referred for L shoulder pain. Hx of R shoulder arthroscopy 10/13/22 with biceps tenodesis with prolonged rehab process due to persistent pain. Hx of L RCR in 2012.   Pt is remaining deficits with her R shoulder that were present during her previous episode of care. Pt reports pain affecting biceps region on both sides. Past med Hx of HTN, asthma. Pt needs help for dressing and pays for getting hair done.   Patient reports pain at night and disturbed sleep secondary to shoulder pain. She reports numbness/tingling that can go down to L wrist; some remote history of neck pain. Pt massages area with CBD cream, and she feels that his helps along upper quarter with her pain.    PAIN:  Pain Intensity: Present: 7/10,  Best: 5/10, Worst: 9/10 Pain location: L>R shoulder, biceps mm belly, anterior shoulder Pain Quality: electric, "running" pain down L upper limb, numbness/tingling   Radiating: Yes , down to L arm/forearm and as far as wrist Numbness/Tingling: Yes; down L upper limb  Focal Weakness: Yes; unable to hold heavy items in either upper limb  Aggravating factors: reaching upward, lifting heavier items, dressing activities  Relieving factors: CBD cream, not using arms as much;  24-hour pain behavior: worse in AM  History of prior shoulder or neck/shoulder injury, pain, surgery, or therapy: Yes; L RCR in 2012, R shoulder arthroscopy with biceps tenodesis/SAD in 09/2022 Falls: Has patient fallen in last 6 months? No, Number of falls: N/A Dominant hand: right Imaging: Yes ;  IMPRESSION:  Left shoulder: 1. Moderate supraspinatus tendinosis with fraying of bursal surface fibers. Mild subacromial subdeltoid bursal distention. 2. Severe subscapularis tendinosis with a high-grade, partial-thickness tear of the subscapularis tendon. there is a severe subscapularis muscle atrophy with fatty infiltration. 3. Mild acromioclavicular osteoarthrosis. 4. Moderate biceps tendinosis.    Red flags (personal history of cancer, chills/fever, night sweats, nausea, vomiting, unrelenting pain, unexplained weight gain/loss): Negative  PRECAUTIONS: Latex allergy  WEIGHT BEARING RESTRICTIONS: No  FALLS: Has patient fallen in last 6 months? 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  Recent memory is intact.  Remote memory is intact.  Attention span and concentration are intact.  Expressive speech is intact.  Patient's fund of knowledge is within normal limits for educational level.    Gross Musculoskeletal Assessment Tremor: None Bulk: Normal Tone: Normal   Posture Rounded shoulders, mild protracted scapulae at rest  Cervical Screen* AROM: WFL and painless with overpressure in all planes Spurlings A (ipsilateral lateral flexion/axial compression): R: Negative L: Negative Distraction: Negative for alleviation/change in concordant pain  Hoffman Sign (cervical cord compression): R: Negative L: Negative   AROM AROM (Normal range in degrees) AROM 11/28/23   Right Left  Shoulder    Flexion 98 110  Extension    Abduction 136 80  External Rotation (arm at side) 82 64  Internal Rotation    Hands Behind Head C6 L upper trap  Hands Behind Back T12 L1      Elbow    Flexion WNL WNL  Extension WNL WNL  Pronation WNL WNL  Supination WNL WNL  (* = pain; Blank rows = not tested)  UE MMT: MMT (out of 5) Right Left   Cervical (isometric)  Flexion WNL  Extension WNL  Lateral Flexion WNL WNL  Rotation WNL WNL      Shoulder   Flexion 4-* 4-*  Extension    Abduction 4* 4*  External rotation 4* 4  Internal rotation 4* 4*  Horizontal abduction    Horizontal adduction    Lower Trapezius    Rhomboids        (* = pain; Blank rows = not tested)   Sensation Deferred  Reflexes Deferred  Palpation Location LEFT  RIGHT           Subocciptials    Cervical paraspinals    Upper Trapezius    Levator Scapulae    Rhomboid Major/Minor    Sternoclavicular joint    Acromioclavicular joint 1 1  Coracoid process 1 1  Long head of biceps 2 2  Supraspinatus 1 1  Infraspinatus    Subscapularis    Teres Minor    Teres Major    Pectoralis Major 1 1  Pectoralis Minor     Anterior Deltoid 1 2  Lateral Deltoid 1 2  Posterior Deltoid 1 1  Latissimus Dorsi    Sternocleidomastoid    (Blank rows = not tested) Graded on 0-4 scale (0 = no pain, 1 = pain, 2 = pain with wincing/grimacing/flinching, 3 = pain with withdrawal, 4 = unwilling to allow palpation), (Blank rows = not tested)   PASSIVE ACCESSORIES Glenohumeral: inferior glide WNL, posterior glide moderate restriction  Scapulothoracic: upward glide moderate restriction, posterior tilt mild restriction   SPECIAL TESTS  Bear Hug: R Negative, L Negative  Rotator Cuff  Drop Arm Test: R Negative, L Negative Painful Arc (Pain from 60 to 120 degrees scaption): R Positive, L Positive External Rotation Resistance: R Posiitve, L Negative    Bicep Tendon Pathology Speed (shoulder flexion to 90, external rotation, full elbow extension, and forearm supination with resistance: R Mild pain, L Positive (significant pain) Yergason's (resisted shoulder ER and supination/biceps tendon pathology): R Negative, L Positive for mild shoulder pain      TODAY'S TREATMENT 12/28/2023   SUBJECTIVE STATEMENT:   Patient reports sleeping relatively well last night and doing  okay this AM. She reports having significant discomfort Tuesday night and having a hard time sleeping then. She had to use massage tool on L shoulder and pillow behind it when lying down. 5/10 pain in L shoulder at arrival. 3/10 pain affecting R shoulder. She does not believe that she had issue with PT Tuesday morning.    Upper body ergometer, 2 minutes forward, 2 minutes backward - for tissue warm-up to improve muscle performance, improved soft tissue mobility/extensibility - 1 min unbilled time   Manual Therapy - for symptom modulation, soft tissue sensitivity and mobility, joint mobility, ROM   In supine: L shoulder PROM within pt tolerance within pt tolerance ; x 3 min Glenohumeral joint mobilizations; gr III for inferior glide; 3 x 30 sec  bouts STM L biceps and anterior/lateral deltoid; x 3 min   *not today* Scapular mobilization; 2 x 30 sec bouts for posterior tilt, elevation, and upward rotation  GHJ mob gr III for posterior glide; 2 x 30 sec bouts    Therapeutic Exercise - for improved soft tissue flexibility and extensibility as needed for ROM, improved strength as needed to improve performance of CKC activities/functional movements   Latex-free Theraband*  Supine shoulder flexion with dowel; 1x10 Reclined shoulder flexion AAROM with dowel, bilat; 1 x 10 Reclined shoulder flexion AROM, bilat; 1 x 10  Tband high row for shoulder flexion AAROM during eccentric phase, Red latex-free band (2nd hook from top); 2 x 10   Standing adduction with Red Tband; ABD assist; 2 x 10 with LUE   PATIENT EDUCATION: Encouraged continued HEP with Tbands (non-latex) given for home.   Cold pack (unbilled) - for anti-inflammatory and analgesic effect as needed for reduced pain and improved ability to participate in active PT intervention, along L shoulder in sitting, x 5 minutes    *next visit* Standing bilateral row with Blue Tband; 2 x 10, 3 sec hold  *not today* Serratus punch at well, CKC punch; 2 x 10 Red Tband bilateral ER; 2 x 10 Bilateral shoulder flexion reactive isometric, Red Tband 20x L shoulder IR reactive isometric walkout; Red Tband; 2x10 Serratus punch with 3-lb Dbell; 1x15, LUE only Serratus punch with dowel; 2 x 10 Cold pack (unbilled) - for anti-inflammatory and analgesic effect as needed for reduced pain and improved ability to participate in active PT intervention, along bilat shoulders in sitting, x 5 minutes   PATIENT EDUCATION:  Education details: see above for patient education details Person educated: Patient Education method: Explanation, Demonstration, and Handouts Education comprehension: verbalized understanding and returned demonstration   HOME EXERCISE PROGRAM:  Access Code:  [email or web address] URL: [email or web address] Date: 12/19/2023 Prepared by: Denese Finn  Exercises - Standing Shoulder Flexion Reactive Isometrics with Elbow Extended  - 1 x daily - 7 x weekly - 2 sets - 10 reps - Shoulder Internal Rotation Reactive Isometrics  - 1 x daily - 7 x weekly - 2 sets - 10 reps - Shoulder External Rotation and Scapular Retraction with Resistance  - 1 x daily - 7 x weekly - 2 sets - 10 reps - Standing Shoulder Row with Anchored Resistance  - 1 x daily - 7 x weekly - 2 sets - 10 reps - 3sec hold      ASSESSMENT:  CLINICAL IMPRESSION: Patient reports notable pain Tuesday evening and she had notable discomfort this AM in L anterior shoulder. She feels that it is not as severe this AM, but she does have difficulty tolerating passive and active-assisted forward elevation  beyond shoulder height at baseline today. Pt is able to attain functional ROM in flexion, abduction, ER, and IR following manual therapy today. We modestly progressed with periscapular drills and AAROM work in standing today; pt does report some pain with resisted adduction drill. Pt has bilateral deficits in shoulder A/PROM, RTC and deltoid strength, anterior shoulder/upper arm pain, L upper limb paresthesias intermittently. Pt will continue to benefit from skilled PT services to address deficits and improve function.   OBJECTIVE IMPAIRMENTS: decreased ROM, decreased strength, hypomobility, impaired flexibility, impaired UE functional use, postural dysfunction, and pain.   ACTIVITY LIMITATIONS: carrying, lifting, bathing, dressing, reach over head, and hygiene/grooming  PARTICIPATION LIMITATIONS: meal prep, cleaning, laundry, driving, shopping, and community activity  PERSONAL FACTORS: Past/current experiences, Time since onset of injury/illness/exacerbation, and 3+ comorbidities: (Asthma, HTN, hx of L RCR, and hx of R shoulder arthroscopy with poor progress) are also affecting patient's  functional outcome.   REHAB POTENTIAL: Fair Hx of R shoulder arthroscopy with biceps tenodesis, bursectomy, debridement with poor post-op rehab progress  CLINICAL DECISION MAKING: Unstable/unpredictable  EVALUATION COMPLEXITY: High   GOALS: Goals reviewed with patient? Yes  SHORT TERM GOALS: Target date: 12/14/2023  Pt will be independent with HEP to improve strength and decrease shoulder pain to improve pain-free function at home and work. Baseline: 11/23/23: Baseline HEP reviewed, 3-way shoulder isometrics (see [employer] program).  Goal status: INITIAL   LONG TERM GOALS: Target date: 01/04/2024  Pt will have forward elevation of bilateral shoulders to 120 deg or greater as needed for self-care and ADLs Baseline: 11/23/23: Forward flexion AROM R 98, L 110 Goal status: INITIAL  2.  Pt will decrease worst shoulder pain by at least 3 points on the NPRS in order to demonstrate clinically significant reduction in shoulder pain. Baseline: 11/23/23: 10/10 at worst Goal status: INITIAL  3.  Pt will decrease quick DASH score to less than or equal to 20% in order to demonstrate clinically significant reduction in disability related to shoulder pain    Baseline: 11/23/23: 84% Goal status: INITIAL  4. Pt will increase strength by to 4+/5 or greater MMT grade for assessed shoulder musculature in order to demonstrate improvement in strength and function      Baseline: 11/23/23: Strength 4- to 4 with pain during testing. Goal status: INITIAL  5. Pt will achieve functional ER to ipsilateral scapula and functional IR to bra line as needed for dressing, self-care ADLs     Baseline: 11/23/23: Limited functional ER bilat (see chart above for hand behind head/back) Goal status: INITIAL  PLAN: PT FREQUENCY: 1-2x/week  PT DURATION: 6 weeks  PLANNED INTERVENTIONS: Therapeutic exercises, Therapeutic activity, Neuromuscular re-education, Balance training, Gait training, Patient/Family education, Self  Care, Joint mobilization, Joint manipulation, Vestibular training, Canalith repositioning, Orthotic/Fit training, DME instructions, Dry Needling, Electrical stimulation, Spinal manipulation, Spinal mobilization, Cryotherapy, Moist heat, Taping, Traction, Ultrasound, Ionotophoresis 4mg /ml Dexamethasone, Manual therapy, and Re-evaluation.  PLAN FOR NEXT SESSION:  Continue PT with supine gentle AAROM within tolerable ROM. RTC and LHB isometrics with progression to isotonics. Progress A/AAROM as tolerated.    Denese Finn, PT, DPT #[id number]  Aleatha Hunting, PT 12/28/2023, 7:54 AM

## 2024-01-01 NOTE — Therapy (Signed)
 OUTPATIENT PHYSICAL THERAPY SHOULDER TREATMENT  Patient Name: Anita Mendoza MRN: [id number] DOB:1971-02-09, 53 y.o., female Today's Date: 01/02/2024  END OF SESSION:  PT End of Session - 01/02/24 1038     Visit Number 7    Number of Visits 13    Date for PT Re-Evaluation 01/04/24    Authorization Type [employer] 2025    PT Start Time 1033    PT Stop Time 1115    PT Time Calculation (min) 42 min    Activity Tolerance Patient tolerated treatment well    Behavior During Therapy [facility] for tasks assessed/performed              Past Medical History:  Diagnosis Date   Asthma 2014   Hypertension    Past Surgical History:  Procedure Laterality Date   ABDOMINAL HYSTERECTOMY     carpul tunnel Bilateral 2011   choleycystectomy N/A 2000   ROTATOR CUFF REPAIR Left 2012   TONSILLECTOMY N/A 1979   Patient Active Problem List   Diagnosis Date Noted   Pain in right shoulder 05/01/2023    PCP: Neda Balk, MD  REFERRING PROVIDER: Rozann Cornell, PA-C  REFERRING DIAG:  [phone] (ICD-10-CM) - Pain in left shoulder  G89.29 (ICD-10-CM) - Other chronic pain  M67.814 (ICD-10-CM) - Other specified disorders of tendon, left shoulder    RATIONALE FOR EVALUATION AND TREATMENT: Rehabilitation  THERAPY DIAG: Left shoulder pain, unspecified chronicity  Stiffness of left shoulder, not elsewhere classified  Muscle weakness (generalized)  ONSET DATE: October 2024  FOLLOW-UP APPT SCHEDULED WITH REFERRING PROVIDER: None currently scheduled in EMR  PERTINENT HISTORY:  Pt is a 53 year old female known to this clinic - currently referred for L shoulder pain. Hx of R shoulder arthroscopy 10/13/22 with biceps tenodesis with prolonged rehab process due to persistent pain. Hx of L RCR in 2012.   Pt is remaining deficits with her R shoulder that were present during her previous episode of care. Pt reports pain affecting biceps region on both sides. Past med Hx of HTN,  asthma. Pt needs help for dressing and pays for getting hair done.   Patient reports pain at night and disturbed sleep secondary to shoulder pain. She reports numbness/tingling that can go down to L wrist; some remote history of neck pain. Pt massages area with CBD cream, and she feels that his helps along upper quarter with her pain.    PAIN:  Pain Intensity: Present: 7/10, Best: 5/10, Worst: 9/10 Pain location: L>R shoulder, biceps mm belly, anterior shoulder Pain Quality: electric, "running" pain down L upper limb, numbness/tingling   Radiating: Yes , down to L arm/forearm and as far as wrist Numbness/Tingling: Yes; down L upper limb  Focal Weakness: Yes; unable to hold heavy items in either upper limb  Aggravating factors: reaching upward, lifting heavier items, dressing activities  Relieving factors: CBD cream, not using arms as much;  24-hour pain behavior: worse in AM  History of prior shoulder or neck/shoulder injury, pain, surgery, or therapy: Yes; L RCR in 2012, R shoulder arthroscopy with biceps tenodesis/SAD in 09/2022 Falls: Has patient fallen in last 6 months? No, Number of falls: N/A Dominant hand: right Imaging: Yes ;  IMPRESSION:  Left shoulder: 1. Moderate supraspinatus tendinosis with fraying of bursal surface fibers. Mild subacromial subdeltoid bursal distention. 2. Severe subscapularis tendinosis with a high-grade, partial-thickness tear of the subscapularis tendon. there is a severe subscapularis muscle atrophy with fatty infiltration. 3. Mild acromioclavicular osteoarthrosis. 4. Moderate biceps tendinosis.  Red flags (personal history of cancer, chills/fever, night sweats, nausea, vomiting, unrelenting pain, unexplained weight gain/loss): Negative  PRECAUTIONS: Latex allergy  WEIGHT BEARING RESTRICTIONS: No  FALLS: Has patient fallen in last 6 months? 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  Recent memory is intact.  Remote memory is intact.  Attention span and concentration are intact.  Expressive speech is intact.  Patient's fund of knowledge is within normal limits for educational level.    Gross Musculoskeletal Assessment Tremor: None Bulk: Normal Tone: Normal   Posture Rounded shoulders, mild protracted scapulae at rest  Cervical Screen* AROM: WFL and painless with overpressure in all planes Spurlings A (ipsilateral lateral flexion/axial compression): R: Negative L: Negative Distraction: Negative for alleviation/change in concordant pain  Hoffman Sign (cervical cord compression): R: Negative L: Negative   AROM AROM (Normal range in degrees) AROM 11/28/23   Right Left  Shoulder    Flexion 98 110  Extension    Abduction 136 80  External Rotation (arm at side) 82 64  Internal Rotation    Hands Behind Head C6 L upper trap  Hands Behind Back T12 L1      Elbow    Flexion WNL WNL  Extension WNL WNL  Pronation WNL WNL  Supination WNL WNL  (* = pain; Blank rows = not tested)  UE MMT: MMT (out of 5) Right Left   Cervical (isometric)  Flexion WNL  Extension WNL  Lateral Flexion WNL WNL  Rotation WNL WNL      Shoulder   Flexion 4-* 4-*  Extension    Abduction 4* 4*  External rotation 4* 4  Internal rotation 4* 4*  Horizontal abduction    Horizontal adduction    Lower Trapezius    Rhomboids        (* = pain; Blank rows = not  tested)   Sensation Deferred  Reflexes Deferred  Palpation Location LEFT  RIGHT           Subocciptials    Cervical paraspinals    Upper Trapezius    Levator Scapulae    Rhomboid Major/Minor    Sternoclavicular joint    Acromioclavicular joint 1 1  Coracoid process 1 1  Long head of biceps 2 2  Supraspinatus 1 1  Infraspinatus    Subscapularis    Teres Minor    Teres Major    Pectoralis Major 1 1  Pectoralis Minor    Anterior Deltoid 1 2  Lateral Deltoid 1 2  Posterior Deltoid 1 1  Latissimus Dorsi    Sternocleidomastoid    (Blank rows = not tested) Graded on 0-4 scale (0 = no pain, 1 = pain, 2 = pain with wincing/grimacing/flinching, 3 = pain with withdrawal, 4 = unwilling to allow palpation), (Blank rows = not tested)   PASSIVE ACCESSORIES Glenohumeral: inferior glide WNL, posterior glide moderate restriction  Scapulothoracic: upward glide moderate restriction, posterior tilt mild restriction   SPECIAL TESTS  Bear Hug: R Negative, L Negative  Rotator Cuff  Drop Arm Test: R Negative, L  Negative Painful Arc (Pain from 60 to 120 degrees scaption): R Positive, L Positive External Rotation Resistance: R Posiitve, L Negative    Bicep Tendon Pathology Speed (shoulder flexion to 90, external rotation, full elbow extension, and forearm supination with resistance: R Mild pain, L Positive (significant pain) Yergason's (resisted shoulder ER and supination/biceps tendon pathology): R Negative, L Positive for mild shoulder pain      TODAY'S TREATMENT 01/02/2024   SUBJECTIVE STATEMENT:   Patient reports ongoing L shoulder pain. Pt reports 7/10 pain this AM. She reports pain with lifting any item with elbow fully extended with either UE. Pt states that she does tolerate her home exercises well.    Upper body ergometer, 2 minutes forward, 2 minutes backward - for tissue warm-up to improve muscle performance, improved soft tissue mobility/extensibility - subjective  gathered during this time   Manual Therapy - for symptom modulation, soft tissue sensitivity and mobility, joint mobility, ROM   In supine: L shoulder PROM within pt tolerance within pt tolerance ; x 3 min GHJ mob gr I-II A-P for pain control; 2 x 30 sec bouts STM/DTM L upper trapezius; x 3 min STM L biceps and anterior/lateral deltoid; x 5 min   *not today* Glenohumeral joint mobilizations; gr III for inferior glide; 3 x 30 sec bouts Scapular mobilization; 2 x 30 sec bouts for posterior tilt, elevation, and upward rotation     Therapeutic Exercise - for improved soft tissue flexibility and extensibility as needed for ROM, improved strength as needed to improve performance of CKC activities/functional movements   Latex-free Theraband* Supine shoulder flexion AAROM with dowel, bilat; 1 x 15 Supine shoulder flexion AROM with 1-lb Dbell, bilat; 2 x 10  Tband single-arm high row for shoulder flexion AAROM during eccentric phase, Red latex-free band (2nd hook from top); 2 x 10   Standing adduction with Red Tband; ABD assist; 2 x 10 with LUE   PATIENT EDUCATION: Encouraged continued HEP with Tbands (non-latex) given for home.   Cold pack (unbilled) - for anti-inflammatory and analgesic effect as needed for reduced pain and improved ability to participate in active PT intervention, along L shoulder in sitting, x 5 minutes    *next visit* Standing bilateral row with Blue Tband; 2 x 10, 3 sec hold  *not today* Reclined shoulder flexion AROM, bilat; 1 x 10 Serratus punch at well, CKC punch; 2 x 10 Red Tband bilateral ER; 2 x 10 Bilateral shoulder flexion reactive isometric, Red Tband 20x L shoulder IR reactive isometric walkout; Red Tband; 2x10 Serratus punch with 3-lb Dbell; 1x15, LUE only Serratus punch with dowel; 2 x 10 Cold pack (unbilled) - for anti-inflammatory and analgesic effect as needed for reduced pain and improved ability to participate in active PT intervention,  along bilat shoulders in sitting, x 5 minutes   PATIENT EDUCATION:  Education details: see above for patient education details Person educated: Patient Education method: Explanation, Demonstration, and Handouts Education comprehension: verbalized understanding and returned demonstration   HOME EXERCISE PROGRAM:  Access Code: [email or web address] URL: [email or web address] Date: 12/19/2023 Prepared by: Denese Finn  Exercises - Standing Shoulder Flexion Reactive Isometrics with Elbow Extended  - 1 x daily - 7 x weekly - 2 sets - 10 reps - Shoulder Internal Rotation Reactive Isometrics  - 1 x daily - 7 x weekly - 2 sets - 10 reps - Shoulder External Rotation and Scapular Retraction with Resistance  - 1 x daily - 7 x weekly - 2 sets - 10 reps -  Standing Shoulder Row with Anchored Resistance  - 1 x daily - 7 x weekly - 2 sets - 10 reps - 3sec hold      ASSESSMENT:  CLINICAL IMPRESSION: Patient exhibits WFL L shoulder PROM and is able to progress to resisted flexion bilat in supine/gravity partially-eliminated position. We utilized eccentrics to work on standing flexion and abduction ROM. Pt to continue with isometric/walkout exercises at home as prescribed in [facility] HEP (see above). Pt has bilateral deficits in shoulder A/PROM, RTC and deltoid strength, anterior shoulder/upper arm pain, L upper limb paresthesias intermittently. Pt will continue to benefit from skilled PT services to address deficits and improve function.   OBJECTIVE IMPAIRMENTS: decreased ROM, decreased strength, hypomobility, impaired flexibility, impaired UE functional use, postural dysfunction, and pain.   ACTIVITY LIMITATIONS: carrying, lifting, bathing, dressing, reach over head, and hygiene/grooming  PARTICIPATION LIMITATIONS: meal prep, cleaning, laundry, driving, shopping, and community activity  PERSONAL FACTORS: Past/current experiences, Time since onset of injury/illness/exacerbation, and 3+  comorbidities: (Asthma, HTN, hx of L RCR, and hx of R shoulder arthroscopy with poor progress) are also affecting patient's functional outcome.   REHAB POTENTIAL: Fair Hx of R shoulder arthroscopy with biceps tenodesis, bursectomy, debridement with poor post-op rehab progress  CLINICAL DECISION MAKING: Unstable/unpredictable  EVALUATION COMPLEXITY: High   GOALS: Goals reviewed with patient? Yes  SHORT TERM GOALS: Target date: 12/14/2023  Pt will be independent with HEP to improve strength and decrease shoulder pain to improve pain-free function at home and work. Baseline: 11/23/23: Baseline HEP reviewed, 3-way shoulder isometrics (see [employer] program).  Goal status: INITIAL   LONG TERM GOALS: Target date: 01/04/2024  Pt will have forward elevation of bilateral shoulders to 120 deg or greater as needed for self-care and ADLs Baseline: 11/23/23: Forward flexion AROM R 98, L 110 Goal status: INITIAL  2.  Pt will decrease worst shoulder pain by at least 3 points on the NPRS in order to demonstrate clinically significant reduction in shoulder pain. Baseline: 11/23/23: 10/10 at worst Goal status: INITIAL  3.  Pt will decrease quick DASH score to less than or equal to 20% in order to demonstrate clinically significant reduction in disability related to shoulder pain    Baseline: 11/23/23: 84% Goal status: INITIAL  4. Pt will increase strength by to 4+/5 or greater MMT grade for assessed shoulder musculature in order to demonstrate improvement in strength and function      Baseline: 11/23/23: Strength 4- to 4 with pain during testing. Goal status: INITIAL  5. Pt will achieve functional ER to ipsilateral scapula and functional IR to bra line as needed for dressing, self-care ADLs     Baseline: 11/23/23: Limited functional ER bilat (see chart above for hand behind head/back) Goal status: INITIAL  PLAN: PT FREQUENCY: 1-2x/week  PT DURATION: 6 weeks  PLANNED INTERVENTIONS: Therapeutic  exercises, Therapeutic activity, Neuromuscular re-education, Balance training, Gait training, Patient/Family education, Self Care, Joint mobilization, Joint manipulation, Vestibular training, Canalith repositioning, Orthotic/Fit training, DME instructions, Dry Needling, Electrical stimulation, Spinal manipulation, Spinal mobilization, Cryotherapy, Moist heat, Taping, Traction, Ultrasound, Ionotophoresis 4mg /ml Dexamethasone, Manual therapy, and Re-evaluation.  PLAN FOR NEXT SESSION:  Continue PT with supine gentle AAROM within tolerable ROM. RTC and LHB isometrics with progression to isotonics. Progress A/AAROM as tolerated.    Denese Finn, PT, DPT #[id number]  Aleatha Hunting, PT 01/02/2024, 10:39 AM

## 2024-01-02 ENCOUNTER — Ambulatory Visit: Admitting: Physical Therapy

## 2024-01-02 ENCOUNTER — Encounter: Payer: Self-pay | Admitting: Physical Therapy

## 2024-01-02 DIAGNOSIS — M25512 Pain in left shoulder: Secondary | ICD-10-CM | POA: Diagnosis not present

## 2024-01-02 DIAGNOSIS — M25612 Stiffness of left shoulder, not elsewhere classified: Secondary | ICD-10-CM

## 2024-01-02 DIAGNOSIS — M6281 Muscle weakness (generalized): Secondary | ICD-10-CM

## 2024-01-04 ENCOUNTER — Ambulatory Visit: Admitting: Physical Therapy

## 2024-01-04 NOTE — Therapy (Incomplete)
 OUTPATIENT PHYSICAL THERAPY SHOULDER TREATMENT  Patient Name: Anita Mendoza MRN: [id number] DOB:Sep 11, 1970, 53 y.o., female Today's Date: 01/04/2024  END OF SESSION:     Past Medical History:  Diagnosis Date   Asthma 2014   Hypertension    Past Surgical History:  Procedure Laterality Date   ABDOMINAL HYSTERECTOMY     carpul tunnel Bilateral 2011   choleycystectomy N/A 2000   ROTATOR CUFF REPAIR Left 2012   TONSILLECTOMY N/A 1979   Patient Active Problem List   Diagnosis Date Noted   Pain in right shoulder 05/01/2023    PCP: Neda Balk, MD  REFERRING PROVIDER: Rozann Cornell, PA-C  REFERRING DIAG:  3164798033 (ICD-10-CM) - Pain in left shoulder  G89.29 (ICD-10-CM) - Other chronic pain  M67.814 (ICD-10-CM) - Other specified disorders of tendon, left shoulder    RATIONALE FOR EVALUATION AND TREATMENT: Rehabilitation  THERAPY DIAG: Left shoulder pain, unspecified chronicity  Stiffness of left shoulder, not elsewhere classified  Muscle weakness (generalized)  ONSET DATE: October 2024  FOLLOW-UP APPT SCHEDULED WITH REFERRING PROVIDER: None currently scheduled in EMR  PERTINENT HISTORY:  Pt is a 53 year old female known to this clinic - currently referred for L shoulder pain. Hx of R shoulder arthroscopy 10/13/22 with biceps tenodesis with prolonged rehab process due to persistent pain. Hx of L RCR in 2012.   Pt is remaining deficits with her R shoulder that were present during her previous episode of care. Pt reports pain affecting biceps region on both sides. Past med Hx of HTN, asthma. Pt needs help for dressing and pays for getting hair done.   Patient reports pain at night and disturbed sleep secondary to shoulder pain. She reports numbness/tingling that can go down to L wrist; some remote history of neck pain. Pt massages area with CBD cream, and she feels that his helps along upper quarter with her pain.    PAIN:  Pain Intensity: Present: 7/10,  Best: 5/10, Worst: 9/10 Pain location: L>R shoulder, biceps mm belly, anterior shoulder Pain Quality: electric, "running" pain down L upper limb, numbness/tingling   Radiating: Yes , down to L arm/forearm and as far as wrist Numbness/Tingling: Yes; down L upper limb  Focal Weakness: Yes; unable to hold heavy items in either upper limb  Aggravating factors: reaching upward, lifting heavier items, dressing activities  Relieving factors: CBD cream, not using arms as much;  24-hour pain behavior: worse in AM  History of prior shoulder or neck/shoulder injury, pain, surgery, or therapy: Yes; L RCR in 2012, R shoulder arthroscopy with biceps tenodesis/SAD in 09/2022 Falls: Has patient fallen in last 6 months? No, Number of falls: N/A Dominant hand: right Imaging: Yes ;  IMPRESSION:  Left shoulder: 1. Moderate supraspinatus tendinosis with fraying of bursal surface fibers. Mild subacromial subdeltoid bursal distention. 2. Severe subscapularis tendinosis with a high-grade, partial-thickness tear of the subscapularis tendon. there is a severe subscapularis muscle atrophy with fatty infiltration. 3. Mild acromioclavicular osteoarthrosis. 4. Moderate biceps tendinosis.    Red flags (personal history of cancer, chills/fever, night sweats, nausea, vomiting, unrelenting pain, unexplained weight gain/loss): Negative  PRECAUTIONS: Latex allergy  WEIGHT BEARING RESTRICTIONS: No  FALLS: Has patient fallen in last 6 months? 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  Recent memory is intact.  Remote memory is intact.  Attention span and concentration are intact.  Expressive speech is intact.  Patient's fund of knowledge is within normal limits for educational level.    Gross Musculoskeletal Assessment Tremor: None Bulk: Normal Tone: Normal   Posture Rounded shoulders, mild protracted scapulae at rest  Cervical Screen* AROM: WFL and painless with overpressure in all planes Spurlings A (ipsilateral lateral flexion/axial compression): R: Negative L: Negative Distraction: Negative for alleviation/change in concordant pain  Hoffman Sign (cervical cord compression): R: Negative L: Negative   AROM AROM (Normal range in degrees) AROM 11/28/23   Right Left  Shoulder    Flexion 98 110  Extension    Abduction 136 80  External Rotation (arm at side) 82 64  Internal Rotation    Hands Behind Head C6 L upper trap  Hands Behind Back T12 L1      Elbow    Flexion WNL WNL  Extension WNL WNL  Pronation WNL WNL  Supination WNL WNL  (* = pain; Blank rows = not tested)  UE MMT: MMT (out of 5) Right Left   Cervical (isometric)  Flexion WNL  Extension WNL  Lateral Flexion WNL WNL  Rotation WNL WNL      Shoulder   Flexion 4-* 4-*  Extension    Abduction 4* 4*  External rotation 4* 4  Internal rotation 4* 4*  Horizontal abduction    Horizontal adduction    Lower Trapezius    Rhomboids        (* = pain; Blank rows = not tested)   Sensation Deferred  Reflexes Deferred  Palpation Location LEFT  RIGHT           Subocciptials    Cervical paraspinals    Upper Trapezius    Levator Scapulae    Rhomboid Major/Minor    Sternoclavicular joint    Acromioclavicular joint 1 1  Coracoid process 1 1  Long head of biceps 2 2  Supraspinatus 1 1  Infraspinatus    Subscapularis    Teres Minor    Teres Major    Pectoralis Major 1 1  Pectoralis Minor     Anterior Deltoid 1 2  Lateral Deltoid 1 2  Posterior Deltoid 1 1  Latissimus Dorsi    Sternocleidomastoid    (Blank rows = not tested) Graded on 0-4 scale (0 = no pain, 1 = pain, 2 = pain with wincing/grimacing/flinching, 3 = pain with withdrawal, 4 = unwilling to allow palpation), (Blank rows = not tested)   PASSIVE ACCESSORIES Glenohumeral: inferior glide WNL, posterior glide moderate restriction  Scapulothoracic: upward glide moderate restriction, posterior tilt mild restriction   SPECIAL TESTS  Bear Hug: R Negative, L Negative  Rotator Cuff  Drop Arm Test: R Negative, L Negative Painful Arc (Pain from 60 to 120 degrees scaption): R Positive, L Positive External Rotation Resistance: R Posiitve, L Negative    Bicep Tendon Pathology Speed (shoulder flexion to 90, external rotation, full elbow extension, and forearm supination with resistance: R Mild pain, L Positive (significant pain) Yergason's (resisted shoulder ER and supination/biceps tendon pathology): R Negative, L Positive for mild shoulder pain      TODAY'S TREATMENT 01/04/2024 ***   SUBJECTIVE STATEMENT:   Patient reports ongoing L shoulder pain. Pt  reports 7/10 pain this AM. She reports pain with lifting any item with elbow fully extended with either UE. Pt states that she does tolerate her home exercises well.    Upper body ergometer, 2 minutes forward, 2 minutes backward - for tissue warm-up to improve muscle performance, improved soft tissue mobility/extensibility - subjective gathered during this time   Manual Therapy - for symptom modulation, soft tissue sensitivity and mobility, joint mobility, ROM   In supine: L shoulder PROM within pt tolerance within pt tolerance ; x 3 min GHJ mob gr I-II A-P for pain control; 2 x 30 sec bouts STM/DTM L upper trapezius; x 3 min STM L biceps and anterior/lateral deltoid; x 5 min   *not today* Glenohumeral joint mobilizations; gr III for inferior glide; 3 x 30 sec  bouts Scapular mobilization; 2 x 30 sec bouts for posterior tilt, elevation, and upward rotation     Therapeutic Exercise - for improved soft tissue flexibility and extensibility as needed for ROM, improved strength as needed to improve performance of CKC activities/functional movements   Latex-free Theraband* Supine shoulder flexion AAROM with dowel, bilat; 1 x 15 Supine shoulder flexion AROM with 1-lb Dbell, bilat; 2 x 10  Tband single-arm high row for shoulder flexion AAROM during eccentric phase, Red latex-free band (2nd hook from top); 2 x 10   Standing adduction with Red Tband; ABD assist; 2 x 10 with LUE   PATIENT EDUCATION: Encouraged continued HEP with Tbands (non-latex) given for home.   Cold pack (unbilled) - for anti-inflammatory and analgesic effect as needed for reduced pain and improved ability to participate in active PT intervention, along L shoulder in sitting, x 5 minutes    *next visit* Standing bilateral row with Blue Tband; 2 x 10, 3 sec hold  *not today* Reclined shoulder flexion AROM, bilat; 1 x 10 Serratus punch at well, CKC punch; 2 x 10 Red Tband bilateral ER; 2 x 10 Bilateral shoulder flexion reactive isometric, Red Tband 20x L shoulder IR reactive isometric walkout; Red Tband; 2x10 Serratus punch with 3-lb Dbell; 1x15, LUE only Serratus punch with dowel; 2 x 10 Cold pack (unbilled) - for anti-inflammatory and analgesic effect as needed for reduced pain and improved ability to participate in active PT intervention, along bilat shoulders in sitting, x 5 minutes   PATIENT EDUCATION:  Education details: see above for patient education details Person educated: Patient Education method: Explanation, Demonstration, and Handouts Education comprehension: verbalized understanding and returned demonstration   HOME EXERCISE PROGRAM:  Access Code: [email or web address] URL: [email or web address] Date: 12/19/2023 Prepared by: Denese Finn  Exercises - Standing Shoulder Flexion Reactive Isometrics with Elbow Extended  - 1 x daily - 7 x weekly - 2 sets - 10 reps - Shoulder Internal Rotation Reactive Isometrics  - 1 x daily - 7 x weekly - 2 sets - 10 reps - Shoulder External Rotation and Scapular Retraction with Resistance  - 1 x daily - 7 x weekly - 2 sets - 10 reps - Standing Shoulder Row with Anchored Resistance  - 1 x daily - 7 x weekly - 2 sets - 10 reps - 3sec hold      ASSESSMENT:  CLINICAL IMPRESSION: ***  Patient exhibits WFL L shoulder PROM and is able to progress to resisted flexion bilat in supine/gravity partially-eliminated position. We utilized eccentrics to work on standing flexion and abduction ROM. Pt to continue with isometric/walkout exercises at home as prescribed in [facility] HEP (see above). Pt has bilateral deficits in  shoulder A/PROM, RTC and deltoid strength, anterior shoulder/upper arm pain, L upper limb paresthesias intermittently. Pt will continue to benefit from skilled PT services to address deficits and improve function.   OBJECTIVE IMPAIRMENTS: decreased ROM, decreased strength, hypomobility, impaired flexibility, impaired UE functional use, postural dysfunction, and pain.   ACTIVITY LIMITATIONS: carrying, lifting, bathing, dressing, reach over head, and hygiene/grooming  PARTICIPATION LIMITATIONS: meal prep, cleaning, laundry, driving, shopping, and community activity  PERSONAL FACTORS: Past/current experiences, Time since onset of injury/illness/exacerbation, and 3+ comorbidities: (Asthma, HTN, hx of L RCR, and hx of R shoulder arthroscopy with poor progress) are also affecting patient's functional outcome.   REHAB POTENTIAL: Fair Hx of R shoulder arthroscopy with biceps tenodesis, bursectomy, debridement with poor post-op rehab progress  CLINICAL DECISION MAKING: Unstable/unpredictable  EVALUATION COMPLEXITY: High   GOALS: Goals reviewed with patient? Yes  SHORT TERM GOALS:  Target date: 12/14/2023  Pt will be independent with HEP to improve strength and decrease shoulder pain to improve pain-free function at home and work. Baseline: 11/23/23: Baseline HEP reviewed, 3-way shoulder isometrics (see [facility] program).  Goal status: INITIAL   LONG TERM GOALS: Target date: 01/04/2024  Pt will have forward elevation of bilateral shoulders to 120 deg or greater as needed for self-care and ADLs Baseline: 11/23/23: Forward flexion AROM R 98, L 110 Goal status: INITIAL  2.  Pt will decrease worst shoulder pain by at least 3 points on the NPRS in order to demonstrate clinically significant reduction in shoulder pain. Baseline: 11/23/23: 10/10 at worst Goal status: INITIAL  3.  Pt will decrease quick DASH score to less than or equal to 20% in order to demonstrate clinically significant reduction in disability related to shoulder pain    Baseline: 11/23/23: 84% Goal status: INITIAL  4. Pt will increase strength by to 4+/5 or greater MMT grade for assessed shoulder musculature in order to demonstrate improvement in strength and function      Baseline: 11/23/23: Strength 4- to 4 with pain during testing. Goal status: INITIAL  5. Pt will achieve functional ER to ipsilateral scapula and functional IR to bra line as needed for dressing, self-care ADLs     Baseline: 11/23/23: Limited functional ER bilat (see chart above for hand behind head/back) Goal status: INITIAL  PLAN: PT FREQUENCY: 1-2x/week  PT DURATION: 6 weeks  PLANNED INTERVENTIONS: Therapeutic exercises, Therapeutic activity, Neuromuscular re-education, Balance training, Gait training, Patient/Family education, Self Care, Joint mobilization, Joint manipulation, Vestibular training, Canalith repositioning, Orthotic/Fit training, DME instructions, Dry Needling, Electrical stimulation, Spinal manipulation, Spinal mobilization, Cryotherapy, Moist heat, Taping, Traction, Ultrasound, Ionotophoresis 4mg /ml Dexamethasone,  Manual therapy, and Re-evaluation.  PLAN FOR NEXT SESSION:  Continue PT with supine gentle AAROM within tolerable ROM. RTC and LHB isometrics with progression to isotonics. Progress A/AAROM as tolerated.    Janine Melbourne, PT, DPT Physical Therapist - [facility]  [facility]  Glenys Lapine, PT 01/04/2024, 8:20 AM

## 2024-01-10 ENCOUNTER — Ambulatory Visit: Admitting: Physical Therapy

## 2024-01-10 DIAGNOSIS — M25612 Stiffness of left shoulder, not elsewhere classified: Secondary | ICD-10-CM

## 2024-01-10 DIAGNOSIS — M6281 Muscle weakness (generalized): Secondary | ICD-10-CM

## 2024-01-10 DIAGNOSIS — M25512 Pain in left shoulder: Secondary | ICD-10-CM

## 2024-01-10 NOTE — Therapy (Deleted)
 OUTPATIENT PHYSICAL THERAPY SHOULDER TREATMENT  Patient Name: Anita Mendoza MRN: [id number] DOB:Jun 22, 1971, 53 y.o., female Today's Date: 01/10/2024  END OF SESSION:     Past Medical History:  Diagnosis Date   Asthma 2014   Hypertension    Past Surgical History:  Procedure Laterality Date   ABDOMINAL HYSTERECTOMY     carpul tunnel Bilateral 2011   choleycystectomy N/A 2000   ROTATOR CUFF REPAIR Left 2012   TONSILLECTOMY N/A 1979   Patient Active Problem List   Diagnosis Date Noted   Pain in right shoulder 05/01/2023    PCP: Neda Balk, MD  REFERRING PROVIDER: Rozann Cornell, PA-C  REFERRING DIAG:  [id number] (ICD-10-CM) - Pain in left shoulder  G89.29 (ICD-10-CM) - Other chronic pain  M67.814 (ICD-10-CM) - Other specified disorders of tendon, left shoulder    RATIONALE FOR EVALUATION AND TREATMENT: Rehabilitation  THERAPY DIAG: Left shoulder pain, unspecified chronicity  Stiffness of left shoulder, not elsewhere classified  Muscle weakness (generalized)  ONSET DATE: October 2024  FOLLOW-UP APPT SCHEDULED WITH REFERRING PROVIDER: None currently scheduled in EMR  PERTINENT HISTORY:  Pt is a 53 year old female known to this clinic - currently referred for L shoulder pain. Hx of R shoulder arthroscopy 10/13/22 with biceps tenodesis with prolonged rehab process due to persistent pain. Hx of L RCR in 2012.   Pt is remaining deficits with her R shoulder that were present during her previous episode of care. Pt reports pain affecting biceps region on both sides. Past med Hx of HTN, asthma. Pt needs help for dressing and pays for getting hair done.   Patient reports pain at night and disturbed sleep secondary to shoulder pain. She reports numbness/tingling that can go down to L wrist; some remote history of neck pain. Pt massages area with CBD cream, and she feels that his helps along upper quarter with her pain.    PAIN:  Pain Intensity: Present: 7/10,  Best: 5/10, Worst: 9/10 Pain location: L>R shoulder, biceps mm belly, anterior shoulder Pain Quality: electric, "running" pain down L upper limb, numbness/tingling   Radiating: Yes , down to L arm/forearm and as far as wrist Numbness/Tingling: Yes; down L upper limb  Focal Weakness: Yes; unable to hold heavy items in either upper limb  Aggravating factors: reaching upward, lifting heavier items, dressing activities  Relieving factors: CBD cream, not using arms as much;  24-hour pain behavior: worse in AM  History of prior shoulder or neck/shoulder injury, pain, surgery, or therapy: Yes; L RCR in 2012, R shoulder arthroscopy with biceps tenodesis/SAD in 09/2022 Falls: Has patient fallen in last 6 months? No, Number of falls: N/A Dominant hand: right Imaging: Yes ;  IMPRESSION:  Left shoulder: 1. Moderate supraspinatus tendinosis with fraying of bursal surface fibers. Mild subacromial subdeltoid bursal distention. 2. Severe subscapularis tendinosis with a high-grade, partial-thickness tear of the subscapularis tendon. there is a severe subscapularis muscle atrophy with fatty infiltration. 3. Mild acromioclavicular osteoarthrosis. 4. Moderate biceps tendinosis.    Red flags (personal history of cancer, chills/fever, night sweats, nausea, vomiting, unrelenting pain, unexplained weight gain/loss): Negative  PRECAUTIONS: Latex allergy  WEIGHT BEARING RESTRICTIONS: No  FALLS: Has patient fallen in last 6 months? 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  Recent memory is intact.  Remote memory is intact.  Attention span and concentration are intact.  Expressive speech is intact.  Patient's fund of knowledge is within normal limits for educational level.    Gross Musculoskeletal Assessment Tremor: None Bulk: Normal Tone: Normal   Posture Rounded shoulders, mild protracted scapulae at rest  Cervical Screen* AROM: WFL and painless with overpressure in all planes Spurlings A (ipsilateral lateral flexion/axial compression): R: Negative L: Negative Distraction: Negative for alleviation/change in concordant pain  Hoffman Sign (cervical cord compression): R: Negative L: Negative   AROM AROM (Normal range in degrees) AROM 11/28/23   Right Left  Shoulder    Flexion 98 110  Extension    Abduction 136 80  External Rotation (arm at side) 82 64  Internal Rotation    Hands Behind Head C6 L upper trap  Hands Behind Back T12 L1      Elbow    Flexion WNL WNL  Extension WNL WNL  Pronation WNL WNL  Supination WNL WNL  (* = pain; Blank rows = not tested)  UE MMT: MMT (out of 5) Right Left   Cervical (isometric)  Flexion WNL  Extension WNL  Lateral Flexion WNL WNL  Rotation WNL WNL      Shoulder   Flexion 4-* 4-*  Extension    Abduction 4* 4*  External rotation 4* 4  Internal rotation 4* 4*  Horizontal abduction    Horizontal adduction    Lower Trapezius    Rhomboids        (* = pain; Blank rows = not tested)   Sensation Deferred  Reflexes Deferred  Palpation Location LEFT  RIGHT           Subocciptials    Cervical paraspinals    Upper Trapezius    Levator Scapulae    Rhomboid Major/Minor    Sternoclavicular joint    Acromioclavicular joint 1 1  Coracoid process 1 1  Long head of biceps 2 2  Supraspinatus 1 1  Infraspinatus    Subscapularis    Teres Minor    Teres Major    Pectoralis Major 1 1  Pectoralis Minor     Anterior Deltoid 1 2  Lateral Deltoid 1 2  Posterior Deltoid 1 1  Latissimus Dorsi    Sternocleidomastoid    (Blank rows = not tested) Graded on 0-4 scale (0 = no pain, 1 = pain, 2 = pain with wincing/grimacing/flinching, 3 = pain with withdrawal, 4 = unwilling to allow palpation), (Blank rows = not tested)   PASSIVE ACCESSORIES Glenohumeral: inferior glide WNL, posterior glide moderate restriction  Scapulothoracic: upward glide moderate restriction, posterior tilt mild restriction   SPECIAL TESTS  Bear Hug: R Negative, L Negative  Rotator Cuff  Drop Arm Test: R Negative, L Negative Painful Arc (Pain from 60 to 120 degrees scaption): R Positive, L Positive External Rotation Resistance: R Posiitve, L Negative    Bicep Tendon Pathology Speed (shoulder flexion to 90, external rotation, full elbow extension, and forearm supination with resistance: R Mild pain, L Positive (significant pain) Yergason's (resisted shoulder ER and supination/biceps tendon pathology): R Negative, L Positive for mild shoulder pain      TODAY'S TREATMENT 01/10/2024 ***   SUBJECTIVE STATEMENT:   Patient reports ongoing L shoulder pain. Pt  reports 7/10 pain this AM. She reports pain with lifting any item with elbow fully extended with either UE. Pt states that she does tolerate her home exercises well.    Upper body ergometer, 2 minutes forward, 2 minutes backward - for tissue warm-up to improve muscle performance, improved soft tissue mobility/extensibility - subjective gathered during this time   Manual Therapy - for symptom modulation, soft tissue sensitivity and mobility, joint mobility, ROM   In supine: L shoulder PROM within pt tolerance within pt tolerance ; x 3 min GHJ mob gr I-II A-P for pain control; 2 x 30 sec bouts STM/DTM L upper trapezius; x 3 min STM L biceps and anterior/lateral deltoid; x 5 min   *not today* Glenohumeral joint mobilizations; gr III for inferior glide; 3 x 30 sec  bouts Scapular mobilization; 2 x 30 sec bouts for posterior tilt, elevation, and upward rotation     Therapeutic Exercise - for improved soft tissue flexibility and extensibility as needed for ROM, improved strength as needed to improve performance of CKC activities/functional movements   Latex-free Theraband* Supine shoulder flexion AAROM with dowel, bilat; 1 x 15 Supine shoulder flexion AROM with 1-lb Dbell, bilat; 2 x 10  Tband single-arm high row for shoulder flexion AAROM during eccentric phase, Red latex-free band (2nd hook from top); 2 x 10   Standing adduction with Red Tband; ABD assist; 2 x 10 with LUE   PATIENT EDUCATION: Encouraged continued HEP with Tbands (non-latex) given for home.   Cold pack (unbilled) - for anti-inflammatory and analgesic effect as needed for reduced pain and improved ability to participate in active PT intervention, along L shoulder in sitting, x 5 minutes    *next visit* Standing bilateral row with Blue Tband; 2 x 10, 3 sec hold  *not today* Reclined shoulder flexion AROM, bilat; 1 x 10 Serratus punch at well, CKC punch; 2 x 10 Red Tband bilateral ER; 2 x 10 Bilateral shoulder flexion reactive isometric, Red Tband 20x L shoulder IR reactive isometric walkout; Red Tband; 2x10 Serratus punch with 3-lb Dbell; 1x15, LUE only Serratus punch with dowel; 2 x 10 Cold pack (unbilled) - for anti-inflammatory and analgesic effect as needed for reduced pain and improved ability to participate in active PT intervention, along bilat shoulders in sitting, x 5 minutes   PATIENT EDUCATION:  Education details: see above for patient education details Person educated: Patient Education method: Explanation, Demonstration, and Handouts Education comprehension: verbalized understanding and returned demonstration   HOME EXERCISE PROGRAM:  Access Code: [email or web address] URL: [email or web address] Date: 12/19/2023 Prepared by: Denese Finn  Exercises - Standing Shoulder Flexion Reactive Isometrics with Elbow Extended  - 1 x daily - 7 x weekly - 2 sets - 10 reps - Shoulder Internal Rotation Reactive Isometrics  - 1 x daily - 7 x weekly - 2 sets - 10 reps - Shoulder External Rotation and Scapular Retraction with Resistance  - 1 x daily - 7 x weekly - 2 sets - 10 reps - Standing Shoulder Row with Anchored Resistance  - 1 x daily - 7 x weekly - 2 sets - 10 reps - 3sec hold      ASSESSMENT:  CLINICAL IMPRESSION: ***  Patient exhibits WFL L shoulder PROM and is able to progress to resisted flexion bilat in supine/gravity partially-eliminated position. We utilized eccentrics to work on standing flexion and abduction ROM. Pt to continue with isometric/walkout exercises at home as prescribed in [facility] HEP (see above). Pt has bilateral deficits in  shoulder A/PROM, RTC and deltoid strength, anterior shoulder/upper arm pain, L upper limb paresthesias intermittently. Pt will continue to benefit from skilled PT services to address deficits and improve function.   OBJECTIVE IMPAIRMENTS: decreased ROM, decreased strength, hypomobility, impaired flexibility, impaired UE functional use, postural dysfunction, and pain.   ACTIVITY LIMITATIONS: carrying, lifting, bathing, dressing, reach over head, and hygiene/grooming  PARTICIPATION LIMITATIONS: meal prep, cleaning, laundry, driving, shopping, and community activity  PERSONAL FACTORS: Past/current experiences, Time since onset of injury/illness/exacerbation, and 3+ comorbidities: (Asthma, HTN, hx of L RCR, and hx of R shoulder arthroscopy with poor progress) are also affecting patient's functional outcome.   REHAB POTENTIAL: Fair Hx of R shoulder arthroscopy with biceps tenodesis, bursectomy, debridement with poor post-op rehab progress  CLINICAL DECISION MAKING: Unstable/unpredictable  EVALUATION COMPLEXITY: High   GOALS: Goals reviewed with patient? Yes  SHORT TERM GOALS:  Target date: 12/14/2023  Pt will be independent with HEP to improve strength and decrease shoulder pain to improve pain-free function at home and work. Baseline: 11/23/23: Baseline HEP reviewed, 3-way shoulder isometrics (see [facility] program).  Goal status: INITIAL   LONG TERM GOALS: Target date: 01/04/2024  Pt will have forward elevation of bilateral shoulders to 120 deg or greater as needed for self-care and ADLs Baseline: 11/23/23: Forward flexion AROM R 98, L 110 Goal status: INITIAL  2.  Pt will decrease worst shoulder pain by at least 3 points on the NPRS in order to demonstrate clinically significant reduction in shoulder pain. Baseline: 11/23/23: 10/10 at worst Goal status: INITIAL  3.  Pt will decrease quick DASH score to less than or equal to 20% in order to demonstrate clinically significant reduction in disability related to shoulder pain    Baseline: 11/23/23: 84% Goal status: INITIAL  4. Pt will increase strength by to 4+/5 or greater MMT grade for assessed shoulder musculature in order to demonstrate improvement in strength and function      Baseline: 11/23/23: Strength 4- to 4 with pain during testing. Goal status: INITIAL  5. Pt will achieve functional ER to ipsilateral scapula and functional IR to bra line as needed for dressing, self-care ADLs     Baseline: 11/23/23: Limited functional ER bilat (see chart above for hand behind head/back) Goal status: INITIAL  PLAN: PT FREQUENCY: 1-2x/week  PT DURATION: 6 weeks  PLANNED INTERVENTIONS: Therapeutic exercises, Therapeutic activity, Neuromuscular re-education, Balance training, Gait training, Patient/Family education, Self Care, Joint mobilization, Joint manipulation, Vestibular training, Canalith repositioning, Orthotic/Fit training, DME instructions, Dry Needling, Electrical stimulation, Spinal manipulation, Spinal mobilization, Cryotherapy, Moist heat, Taping, Traction, Ultrasound, Ionotophoresis 4mg /ml Dexamethasone,  Manual therapy, and Re-evaluation.  PLAN FOR NEXT SESSION:  Continue PT with supine gentle AAROM within tolerable ROM. RTC and LHB isometrics with progression to isotonics. Progress A/AAROM as tolerated.    Denese Finn, PT, DPT #[id number]  Aleatha Hunting, PT 01/10/2024, 7:19 AM

## 2024-01-15 ENCOUNTER — Encounter: Payer: Self-pay | Admitting: Physical Therapy

## 2024-01-15 ENCOUNTER — Ambulatory Visit: Attending: Orthopedic Surgery | Admitting: Physical Therapy

## 2024-01-15 DIAGNOSIS — M25612 Stiffness of left shoulder, not elsewhere classified: Secondary | ICD-10-CM | POA: Insufficient documentation

## 2024-01-15 DIAGNOSIS — M6281 Muscle weakness (generalized): Secondary | ICD-10-CM | POA: Insufficient documentation

## 2024-01-15 DIAGNOSIS — M25512 Pain in left shoulder: Secondary | ICD-10-CM | POA: Insufficient documentation

## 2024-01-15 NOTE — Therapy (Signed)
 OUTPATIENT PHYSICAL THERAPY SHOULDER TREATMENT  Patient Name: Anita Mendoza MRN: [id number] DOB:February 15, 1971, 53 y.o., female Today's Date: 01/15/2024  END OF SESSION:  PT End of Session - 01/15/24 1053     Visit Number 8    Number of Visits 13    Date for PT Re-Evaluation 01/04/24    Authorization Type [employer] 2025    PT Start Time 1033    PT Stop Time 1115    PT Time Calculation (min) 42 min    Activity Tolerance Patient tolerated treatment well    Behavior During Therapy [facility] for tasks assessed/performed               Past Medical History:  Diagnosis Date   Asthma 2014   Hypertension    Past Surgical History:  Procedure Laterality Date   ABDOMINAL HYSTERECTOMY     carpul tunnel Bilateral 2011   choleycystectomy N/A 2000   ROTATOR CUFF REPAIR Left 2012   TONSILLECTOMY N/A 1979   Patient Active Problem List   Diagnosis Date Noted   Pain in right shoulder 05/01/2023    PCP: Neda Balk, MD  REFERRING PROVIDER: Rozann Cornell, PA-C  REFERRING DIAG:  8580698882 (ICD-10-CM) - Pain in left shoulder  G89.29 (ICD-10-CM) - Other chronic pain  M67.814 (ICD-10-CM) - Other specified disorders of tendon, left shoulder    RATIONALE FOR EVALUATION AND TREATMENT: Rehabilitation  THERAPY DIAG: Left shoulder pain, unspecified chronicity  Stiffness of left shoulder, not elsewhere classified  Muscle weakness (generalized)  ONSET DATE: October 2024  FOLLOW-UP APPT SCHEDULED WITH REFERRING PROVIDER: None currently scheduled in EMR  PERTINENT HISTORY:  Pt is a 53 year old female known to this clinic - currently referred for L shoulder pain. Hx of R shoulder arthroscopy 10/13/22 with biceps tenodesis with prolonged rehab process due to persistent pain. Hx of L RCR in 2012.   Pt is remaining deficits with her R shoulder that were present during her previous episode of care. Pt reports pain affecting biceps region on both sides. Past med Hx of HTN,  asthma. Pt needs help for dressing and pays for getting hair done.   Patient reports pain at night and disturbed sleep secondary to shoulder pain. She reports numbness/tingling that can go down to L wrist; some remote history of neck pain. Pt massages area with CBD cream, and she feels that his helps along upper quarter with her pain.    PAIN:  Pain Intensity: Present: 7/10, Best: 5/10, Worst: 9/10 Pain location: L>R shoulder, biceps mm belly, anterior shoulder Pain Quality: electric, "running" pain down L upper limb, numbness/tingling   Radiating: Yes , down to L arm/forearm and as far as wrist Numbness/Tingling: Yes; down L upper limb  Focal Weakness: Yes; unable to hold heavy items in either upper limb  Aggravating factors: reaching upward, lifting heavier items, dressing activities  Relieving factors: CBD cream, not using arms as much;  24-hour pain behavior: worse in AM  History of prior shoulder or neck/shoulder injury, pain, surgery, or therapy: Yes; L RCR in 2012, R shoulder arthroscopy with biceps tenodesis/SAD in 09/2022 Falls: Has patient fallen in last 6 months? No, Number of falls: N/A Dominant hand: right Imaging: Yes ;  IMPRESSION:  Left shoulder: 1. Moderate supraspinatus tendinosis with fraying of bursal surface fibers. Mild subacromial subdeltoid bursal distention. 2. Severe subscapularis tendinosis with a high-grade, partial-thickness tear of the subscapularis tendon. there is a severe subscapularis muscle atrophy with fatty infiltration. 3. Mild acromioclavicular osteoarthrosis. 4. Moderate biceps tendinosis.  Red flags (personal history of cancer, chills/fever, night sweats, nausea, vomiting, unrelenting pain, unexplained weight gain/loss): Negative  PRECAUTIONS: Latex allergy  WEIGHT BEARING RESTRICTIONS: No  FALLS: Has patient fallen in last 6 months? 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  Recent memory is intact.  Remote memory is intact.  Attention span and concentration are intact.  Expressive speech is intact.  Patient's fund of knowledge is within normal limits for educational level.    Gross Musculoskeletal Assessment Tremor: None Bulk: Normal Tone: Normal   Posture Rounded shoulders, mild protracted scapulae at rest  Cervical Screen* AROM: WFL and painless with overpressure in all planes Spurlings A (ipsilateral lateral flexion/axial compression): R: Negative L: Negative Distraction: Negative for alleviation/change in concordant pain  Hoffman Sign (cervical cord compression): R: Negative L: Negative   AROM AROM (Normal range in degrees) AROM 11/28/23   Right Left  Shoulder    Flexion 98 110  Extension    Abduction 136 80  External Rotation (arm at side) 82 64  Internal Rotation    Hands Behind Head C6 L upper trap  Hands Behind Back T12 L1      Elbow    Flexion WNL WNL  Extension WNL WNL  Pronation WNL WNL  Supination WNL WNL  (* = pain; Blank rows = not tested)  UE MMT: MMT (out of 5) Right Left   Cervical (isometric)  Flexion WNL  Extension WNL  Lateral Flexion WNL WNL  Rotation WNL WNL      Shoulder   Flexion 4-* 4-*  Extension    Abduction 4* 4*  External rotation 4* 4  Internal rotation 4* 4*  Horizontal abduction    Horizontal adduction    Lower Trapezius    Rhomboids        (* = pain; Blank rows = not  tested)   Sensation Deferred  Reflexes Deferred  Palpation Location LEFT  RIGHT           Subocciptials    Cervical paraspinals    Upper Trapezius    Levator Scapulae    Rhomboid Major/Minor    Sternoclavicular joint    Acromioclavicular joint 1 1  Coracoid process 1 1  Long head of biceps 2 2  Supraspinatus 1 1  Infraspinatus    Subscapularis    Teres Minor    Teres Major    Pectoralis Major 1 1  Pectoralis Minor    Anterior Deltoid 1 2  Lateral Deltoid 1 2  Posterior Deltoid 1 1  Latissimus Dorsi    Sternocleidomastoid    (Blank rows = not tested) Graded on 0-4 scale (0 = no pain, 1 = pain, 2 = pain with wincing/grimacing/flinching, 3 = pain with withdrawal, 4 = unwilling to allow palpation), (Blank rows = not tested)   PASSIVE ACCESSORIES Glenohumeral: inferior glide WNL, posterior glide moderate restriction  Scapulothoracic: upward glide moderate restriction, posterior tilt mild restriction   SPECIAL TESTS  Bear Hug: R Negative, L Negative  Rotator Cuff  Drop Arm Test: R Negative, L  Negative Painful Arc (Pain from 60 to 120 degrees scaption): R Positive, L Positive External Rotation Resistance: R Posiitve, L Negative    Bicep Tendon Pathology Speed (shoulder flexion to 90, external rotation, full elbow extension, and forearm supination with resistance: R Mild pain, L Positive (significant pain) Yergason's (resisted shoulder ER and supination/biceps tendon pathology): R Negative, L Positive for mild shoulder pain      TODAY'S TREATMENT 01/15/2024    SUBJECTIVE STATEMENT:   Patient reports shoulder pain is "not that bad" at arrival to PT. She reports 6/10 NPRS at arrival. She feels that L shoulder and R shoulder are about the same (pt has persistent pain/motion deficits with Hx of R shoulder arthroscopy with limited rehab progress).    Upper body ergometer, 2 minutes forward, 2 minutes backward - for tissue warm-up to improve muscle performance,  improved soft tissue mobility/extensibility - subjective gathered during this time intermittently, 2 min not billed   Manual Therapy - for symptom modulation, soft tissue sensitivity and mobility, joint mobility, ROM   In supine: L shoulder PROM within pt tolerance within pt tolerance ; x 5 min GHJ mob gr III inferior for mobility, I-II A-P for pain control; 2 x 30 sec bouts each STM L biceps and anterior/lateral deltoid; x 3 min   *not today* STM/DTM L upper trapezius; x 3 min Glenohumeral joint mobilizations; gr III for inferior glide; 3 x 30 sec bouts Scapular mobilization; 2 x 30 sec bouts for posterior tilt, elevation, and upward rotation     Therapeutic Exercise - for improved soft tissue flexibility and extensibility as needed for ROM, improved strength as needed to improve performance of CKC activities/functional movements   Latex-free Theraband* Supine shoulder flexion AROM; 1 x 10 Supine shoulder flexion AROM with 1-lb Dbell, bilat; 2 x 10 Reclined shoulder flexion, bilat; 2 x 10; with 1-lb Dells  Standing shoulder flexion AAROM with dowel, bilat; 2 x 10  Standing bilateral row with Blue Tband; 2 x 10, 3 sec hold  Tband single-arm high row for shoulder flexion AAROM during eccentric phase, Red latex-free band (2nd hook from top); 1 x 10 with each UE   PATIENT EDUCATION: HEP review, new band given for standing row.     Cold pack (unbilled) - for anti-inflammatory and analgesic effect as needed for reduced pain and improved ability to participate in active PT intervention, along L shoulder in sitting, x 5 minutes     *not today* Standing adduction with Red Tband; ABD assist; 2 x 10 with LUE Serratus punch at well, CKC punch; 2 x 10 Red Tband bilateral ER; 2 x 10 Bilateral shoulder flexion reactive isometric, Red Tband 20x L shoulder IR reactive isometric walkout; Red Tband; 2x10 Serratus punch with 3-lb Dbell; 1x15, LUE only Serratus punch with dowel; 2 x  10 Cold pack (unbilled) - for anti-inflammatory and analgesic effect as needed for reduced pain and improved ability to participate in active PT intervention, along bilat shoulders in sitting, x 5 minutes   PATIENT EDUCATION:  Education details: see above for patient education details Person educated: Patient Education method: Explanation, Demonstration, and Handouts Education comprehension: verbalized understanding and returned demonstration   HOME EXERCISE PROGRAM:  Access Code: [email or web address] URL: [email or web address] Date: 12/19/2023 Prepared by: Denese Finn  Exercises - Standing Shoulder Flexion Reactive Isometrics with Elbow Extended  - 1 x daily - 7 x weekly - 2 sets - 10 reps - Shoulder Internal Rotation Reactive Isometrics  - 1 x daily - 7 x  weekly - 2 sets - 10 reps - Shoulder External Rotation and Scapular Retraction with Resistance  - 1 x daily - 7 x weekly - 2 sets - 10 reps - Standing Shoulder Row with Anchored Resistance  - 1 x daily - 7 x weekly - 2 sets - 10 reps - 3sec hold      ASSESSMENT:  CLINICAL IMPRESSION:   Patient has good PROM at this time for L shoulder; pt is able to further progress with long-lever flexion in reclined position and added light resistance. Patient tolerates increase in periscapular isotonic intensity well without increase in pain. Pt has bilateral deficits in shoulder A/PROM, RTC and deltoid strength, anterior shoulder/upper arm pain, L upper limb paresthesias intermittently. Pt will continue to benefit from skilled PT services to address deficits and improve function.   OBJECTIVE IMPAIRMENTS: decreased ROM, decreased strength, hypomobility, impaired flexibility, impaired UE functional use, postural dysfunction, and pain.   ACTIVITY LIMITATIONS: carrying, lifting, bathing, dressing, reach over head, and hygiene/grooming  PARTICIPATION LIMITATIONS: meal prep, cleaning, laundry, driving, shopping, and community  activity  PERSONAL FACTORS: Past/current experiences, Time since onset of injury/illness/exacerbation, and 3+ comorbidities: (Asthma, HTN, hx of L RCR, and hx of R shoulder arthroscopy with poor progress) are also affecting patient's functional outcome.   REHAB POTENTIAL: Fair Hx of R shoulder arthroscopy with biceps tenodesis, bursectomy, debridement with poor post-op rehab progress  CLINICAL DECISION MAKING: Unstable/unpredictable  EVALUATION COMPLEXITY: High   GOALS: Goals reviewed with patient? Yes  SHORT TERM GOALS: Target date: 12/14/2023  Pt will be independent with HEP to improve strength and decrease shoulder pain to improve pain-free function at home and work. Baseline: 11/23/23: Baseline HEP reviewed, 3-way shoulder isometrics (see [employer] program).  Goal status: INITIAL   LONG TERM GOALS: Target date: 01/04/2024  Pt will have forward elevation of bilateral shoulders to 120 deg or greater as needed for self-care and ADLs Baseline: 11/23/23: Forward flexion AROM R 98, L 110 Goal status: INITIAL  2.  Pt will decrease worst shoulder pain by at least 3 points on the NPRS in order to demonstrate clinically significant reduction in shoulder pain. Baseline: 11/23/23: 10/10 at worst Goal status: INITIAL  3.  Pt will decrease quick DASH score to less than or equal to 20% in order to demonstrate clinically significant reduction in disability related to shoulder pain    Baseline: 11/23/23: 84% Goal status: INITIAL  4. Pt will increase strength by to 4+/5 or greater MMT grade for assessed shoulder musculature in order to demonstrate improvement in strength and function      Baseline: 11/23/23: Strength 4- to 4 with pain during testing. Goal status: INITIAL  5. Pt will achieve functional ER to ipsilateral scapula and functional IR to bra line as needed for dressing, self-care ADLs     Baseline: 11/23/23: Limited functional ER bilat (see chart above for hand behind head/back) Goal  status: INITIAL  PLAN: PT FREQUENCY: 1-2x/week  PT DURATION: 6 weeks  PLANNED INTERVENTIONS: Therapeutic exercises, Therapeutic activity, Neuromuscular re-education, Balance training, Gait training, Patient/Family education, Self Care, Joint mobilization, Joint manipulation, Vestibular training, Canalith repositioning, Orthotic/Fit training, DME instructions, Dry Needling, Electrical stimulation, Spinal manipulation, Spinal mobilization, Cryotherapy, Moist heat, Taping, Traction, Ultrasound, Ionotophoresis 4mg /ml Dexamethasone, Manual therapy, and Re-evaluation.  PLAN FOR NEXT SESSION:  Continue PT with progressive AROM and addition of weight/progressive load for long-lever flexion as tolerated. RTC and LHB isometrics with progression to isotonics.    Denese Finn, PT, DPT #[id number]  Pauleen Borne  Milon Aloe, PT 01/15/2024, 12:37 PM

## 2024-01-17 ENCOUNTER — Ambulatory Visit: Admitting: Physical Therapy

## 2024-01-17 ENCOUNTER — Encounter: Payer: Self-pay | Admitting: Physical Therapy

## 2024-01-17 DIAGNOSIS — M25512 Pain in left shoulder: Secondary | ICD-10-CM | POA: Diagnosis not present

## 2024-01-17 DIAGNOSIS — M25612 Stiffness of left shoulder, not elsewhere classified: Secondary | ICD-10-CM

## 2024-01-17 DIAGNOSIS — M6281 Muscle weakness (generalized): Secondary | ICD-10-CM

## 2024-01-17 NOTE — Therapy (Signed)
 OUTPATIENT PHYSICAL THERAPY SHOULDER TREATMENT  Patient Name: Anita Mendoza MRN: [id number] DOB:1971-07-31, 53 y.o., female Today's Date: 01/17/2024  END OF SESSION:  PT End of Session - 01/17/24 0820     Visit Number 9    Number of Visits 13    Date for PT Re-Evaluation 01/04/24    Authorization Type [employer] 2025    PT Start Time 0820    PT Stop Time 0900    PT Time Calculation (min) 40 min    Activity Tolerance Patient tolerated treatment well    Behavior During Therapy [facility] for tasks assessed/performed                Past Medical History:  Diagnosis Date   Asthma 2014   Hypertension    Past Surgical History:  Procedure Laterality Date   ABDOMINAL HYSTERECTOMY     carpul tunnel Bilateral 2011   choleycystectomy N/A 2000   ROTATOR CUFF REPAIR Left 2012   TONSILLECTOMY N/A 1979   Patient Active Problem List   Diagnosis Date Noted   Pain in right shoulder 05/01/2023    PCP: Neda Balk, MD  REFERRING PROVIDER: Rozann Cornell, PA-C  REFERRING DIAG:  [id number] (ICD-10-CM) - Pain in left shoulder  G89.29 (ICD-10-CM) - Other chronic pain  M67.814 (ICD-10-CM) - Other specified disorders of tendon, left shoulder    RATIONALE FOR EVALUATION AND TREATMENT: Rehabilitation  THERAPY DIAG: Left shoulder pain, unspecified chronicity  Stiffness of left shoulder, not elsewhere classified  Muscle weakness (generalized)  ONSET DATE: October 2024  FOLLOW-UP APPT SCHEDULED WITH REFERRING PROVIDER: None currently scheduled in EMR  PERTINENT HISTORY:  Pt is a 53 year old female known to this clinic - currently referred for L shoulder pain. Hx of R shoulder arthroscopy 10/13/22 with biceps tenodesis with prolonged rehab process due to persistent pain. Hx of L RCR in 2012.   Pt is remaining deficits with her R shoulder that were present during her previous episode of care. Pt reports pain affecting biceps region on both sides. Past med Hx of HTN,  asthma. Pt needs help for dressing and pays for getting hair done.   Patient reports pain at night and disturbed sleep secondary to shoulder pain. She reports numbness/tingling that can go down to L wrist; some remote history of neck pain. Pt massages area with CBD cream, and she feels that his helps along upper quarter with her pain.    PAIN:  Pain Intensity: Present: 7/10, Best: 5/10, Worst: 9/10 Pain location: L>R shoulder, biceps mm belly, anterior shoulder Pain Quality: electric, "running" pain down L upper limb, numbness/tingling   Radiating: Yes , down to L arm/forearm and as far as wrist Numbness/Tingling: Yes; down L upper limb  Focal Weakness: Yes; unable to hold heavy items in either upper limb  Aggravating factors: reaching upward, lifting heavier items, dressing activities  Relieving factors: CBD cream, not using arms as much;  24-hour pain behavior: worse in AM  History of prior shoulder or neck/shoulder injury, pain, surgery, or therapy: Yes; L RCR in 2012, R shoulder arthroscopy with biceps tenodesis/SAD in 09/2022 Falls: Has patient fallen in last 6 months? No, Number of falls: N/A Dominant hand: right Imaging: Yes ;  IMPRESSION:  Left shoulder: 1. Moderate supraspinatus tendinosis with fraying of bursal surface fibers. Mild subacromial subdeltoid bursal distention. 2. Severe subscapularis tendinosis with a high-grade, partial-thickness tear of the subscapularis tendon. there is a severe subscapularis muscle atrophy with fatty infiltration. 3. Mild acromioclavicular osteoarthrosis. 4. Moderate biceps  tendinosis.    Red flags (personal history of cancer, chills/fever, night sweats, nausea, vomiting, unrelenting pain, unexplained weight gain/loss): Negative  PRECAUTIONS: Latex allergy  WEIGHT BEARING RESTRICTIONS: No  FALLS: Has patient fallen in last 6 months? 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  Recent memory is intact.  Remote memory is intact.  Attention span and concentration are intact.  Expressive speech is intact.  Patient's fund of knowledge is within normal limits for educational level.    Gross Musculoskeletal Assessment Tremor: None Bulk: Normal Tone: Normal   Posture Rounded shoulders, mild protracted scapulae at rest  Cervical Screen* AROM: WFL and painless with overpressure in all planes Spurlings A (ipsilateral lateral flexion/axial compression): R: Negative L: Negative Distraction: Negative for alleviation/change in concordant pain  Hoffman Sign (cervical cord compression): R: Negative L: Negative   AROM AROM (Normal range in degrees) AROM 11/28/23   Right Left  Shoulder    Flexion 98 110  Extension    Abduction 136 80  External Rotation (arm at side) 82 64  Internal Rotation    Hands Behind Head C6 L upper trap  Hands Behind Back T12 L1      Elbow    Flexion WNL WNL  Extension WNL WNL  Pronation WNL WNL  Supination WNL WNL  (* = pain; Blank rows = not tested)  UE MMT: MMT (out of 5) Right Left   Cervical (isometric)  Flexion WNL  Extension WNL  Lateral Flexion WNL WNL  Rotation WNL WNL      Shoulder   Flexion 4-* 4-*  Extension    Abduction 4* 4*  External rotation 4* 4  Internal rotation 4* 4*  Horizontal abduction    Horizontal adduction    Lower Trapezius    Rhomboids        (* = pain; Blank rows = not  tested)   Sensation Deferred  Reflexes Deferred  Palpation Location LEFT  RIGHT           Subocciptials    Cervical paraspinals    Upper Trapezius    Levator Scapulae    Rhomboid Major/Minor    Sternoclavicular joint    Acromioclavicular joint 1 1  Coracoid process 1 1  Long head of biceps 2 2  Supraspinatus 1 1  Infraspinatus    Subscapularis    Teres Minor    Teres Major    Pectoralis Major 1 1  Pectoralis Minor    Anterior Deltoid 1 2  Lateral Deltoid 1 2  Posterior Deltoid 1 1  Latissimus Dorsi    Sternocleidomastoid    (Blank rows = not tested) Graded on 0-4 scale (0 = no pain, 1 = pain, 2 = pain with wincing/grimacing/flinching, 3 = pain with withdrawal, 4 = unwilling to allow palpation), (Blank rows = not tested)   PASSIVE ACCESSORIES Glenohumeral: inferior glide WNL, posterior glide moderate restriction  Scapulothoracic: upward glide moderate restriction, posterior tilt mild restriction   SPECIAL TESTS  Bear Hug: R Negative, L Negative  Rotator Cuff  Drop Arm  Test: R Negative, L Negative Painful Arc (Pain from 60 to 120 degrees scaption): R Positive, L Positive External Rotation Resistance: R Posiitve, L Negative    Bicep Tendon Pathology Speed (shoulder flexion to 90, external rotation, full elbow extension, and forearm supination with resistance: R Mild pain, L Positive (significant pain) Yergason's (resisted shoulder ER and supination/biceps tendon pathology): R Negative, L Positive for mild shoulder pain      TODAY'S TREATMENT 01/17/2024    SUBJECTIVE STATEMENT:   Patient reports 4/10 pain at arrival. Patient reports doing relatively well with her HEP and updated resistance bands. Patient reports doing well after her last visit.    Upper body ergometer, 2 minutes forward, 2 minutes backward - for tissue warm-up to improve muscle performance, improved soft tissue mobility/extensibility - subjective gathered during this time intermittently, 2  min not billed   Manual Therapy - for symptom modulation, soft tissue sensitivity and mobility, joint mobility, ROM   In supine: L shoulder PROM within pt tolerance within pt tolerance ; x 5 min GHJ mob gr III inferior for mobility, I-II A-P for pain control; 2 x 30 sec bouts each STM L biceps and anterior/lateral deltoid; x 2 min   *not today* STM/DTM L upper trapezius; x 3 min Glenohumeral joint mobilizations; gr III for inferior glide; 3 x 30 sec bouts Scapular mobilization; 2 x 30 sec bouts for posterior tilt, elevation, and upward rotation     Therapeutic Exercise - for improved soft tissue flexibility and extensibility as needed for ROM, improved strength as needed to improve performance of CKC activities/functional movements   Latex-free Theraband*  Reclined shoulder flexion, bilat; 2 x 12; with 1-lb Dells  Tband single-arm high row for shoulder flexion AAROM during eccentric phase, Red latex-free band (2nd hook from top); 1 x 10 with each UE  Standing shoulder flexion AAROM with dowel, bilat; 2 x 10  Alternating shoulder tap; CKC on wall in standing, slight forward lean; 2 x 10 alt  PATIENT EDUCATION: Discussed use of ice at home prn, prognosis, PT POC with expected progression of RTC and periscapular strengthening.     *not today* Cold pack (unbilled) - for anti-inflammatory and analgesic effect as needed for reduced pain and improved ability to participate in active PT intervention, along L shoulder in sitting, x 5 minutes Standing shoulder flexion AAROM with dowel, bilat; 2 x 10 Supine shoulder flexion AROM; 1 x 10 Supine shoulder flexion AROM with 1-lb Dbell, bilat; 2 x 10 Standing bilateral row with Blue Tband; 2 x 10, 3 sec hold Standing adduction with Red Tband; ABD assist; 2 x 10 with LUE Serratus punch at well, CKC punch; 2 x 10 Red Tband bilateral ER; 2 x 10 Bilateral shoulder flexion reactive isometric, Red Tband 20x L shoulder IR reactive isometric  walkout; Red Tband; 2x10 Serratus punch with 3-lb Dbell; 1x15, LUE only Serratus punch with dowel; 2 x 10 Cold pack (unbilled) - for anti-inflammatory and analgesic effect as needed for reduced pain and improved ability to participate in active PT intervention, along bilat shoulders in sitting, x 5 minutes   PATIENT EDUCATION:  Education details: see above for patient education details Person educated: Patient Education method: Explanation, Demonstration, and Handouts Education comprehension: verbalized understanding and returned demonstration   HOME EXERCISE PROGRAM:  Access Code: [email or web address] URL: [email or web address] Date: 12/19/2023 Prepared by: Denese Finn  Exercises - Standing Shoulder Flexion Reactive Isometrics with Elbow Extended  - 1 x daily - 7 x weekly - 2 sets -  10 reps - Shoulder Internal Rotation Reactive Isometrics  - 1 x daily - 7 x weekly - 2 sets - 10 reps - Shoulder External Rotation and Scapular Retraction with Resistance  - 1 x daily - 7 x weekly - 2 sets - 10 reps - Standing Shoulder Row with Anchored Resistance  - 1 x daily - 7 x weekly - 2 sets - 10 reps - 3sec hold      ASSESSMENT:  CLINICAL IMPRESSION:   Patient demonstrates improved tolerance of forward elevation with increase in volume for shoulder flexion in reclined position with light resistance. Pt tolerates all drills relatively well today with only exception being standing adduction and ABD assist with Tband. Pt is notably challenged with shoulder complex abduction ROM actively, though passive motion is essentially unrestricted. Pt has bilateral deficits in shoulder A/PROM, RTC and deltoid strength, anterior shoulder/upper arm pain, L upper limb paresthesias intermittently. Pt will continue to benefit from skilled PT services to address deficits and improve function.   OBJECTIVE IMPAIRMENTS: decreased ROM, decreased strength, hypomobility, impaired flexibility, impaired UE functional  use, postural dysfunction, and pain.   ACTIVITY LIMITATIONS: carrying, lifting, bathing, dressing, reach over head, and hygiene/grooming  PARTICIPATION LIMITATIONS: meal prep, cleaning, laundry, driving, shopping, and community activity  PERSONAL FACTORS: Past/current experiences, Time since onset of injury/illness/exacerbation, and 3+ comorbidities: (Asthma, HTN, hx of L RCR, and hx of R shoulder arthroscopy with poor progress) are also affecting patient's functional outcome.   REHAB POTENTIAL: Fair Hx of R shoulder arthroscopy with biceps tenodesis, bursectomy, debridement with poor post-op rehab progress  CLINICAL DECISION MAKING: Unstable/unpredictable  EVALUATION COMPLEXITY: High   GOALS: Goals reviewed with patient? Yes  SHORT TERM GOALS: Target date: 12/14/2023  Pt will be independent with HEP to improve strength and decrease shoulder pain to improve pain-free function at home and work. Baseline: 11/23/23: Baseline HEP reviewed, 3-way shoulder isometrics (see [employer] program).  Goal status: INITIAL   LONG TERM GOALS: Target date: 01/04/2024  Pt will have forward elevation of bilateral shoulders to 120 deg or greater as needed for self-care and ADLs Baseline: 11/23/23: Forward flexion AROM R 98, L 110 Goal status: INITIAL  2.  Pt will decrease worst shoulder pain by at least 3 points on the NPRS in order to demonstrate clinically significant reduction in shoulder pain. Baseline: 11/23/23: 10/10 at worst Goal status: INITIAL  3.  Pt will decrease quick DASH score to less than or equal to 20% in order to demonstrate clinically significant reduction in disability related to shoulder pain    Baseline: 11/23/23: 84% Goal status: INITIAL  4. Pt will increase strength by to 4+/5 or greater MMT grade for assessed shoulder musculature in order to demonstrate improvement in strength and function      Baseline: 11/23/23: Strength 4- to 4 with pain during testing. Goal status:  INITIAL  5. Pt will achieve functional ER to ipsilateral scapula and functional IR to bra line as needed for dressing, self-care ADLs     Baseline: 11/23/23: Limited functional ER bilat (see chart above for hand behind head/back) Goal status: INITIAL  PLAN: PT FREQUENCY: 1-2x/week  PT DURATION: 6 weeks  PLANNED INTERVENTIONS: Therapeutic exercises, Therapeutic activity, Neuromuscular re-education, Balance training, Gait training, Patient/Family education, Self Care, Joint mobilization, Joint manipulation, Vestibular training, Canalith repositioning, Orthotic/Fit training, DME instructions, Dry Needling, Electrical stimulation, Spinal manipulation, Spinal mobilization, Cryotherapy, Moist heat, Taping, Traction, Ultrasound, Ionotophoresis 4mg /ml Dexamethasone, Manual therapy, and Re-evaluation.  PLAN FOR NEXT SESSION:  Continue PT  with progressive AROM and addition of weight/progressive load for long-lever flexion as tolerated. RTC and LHB isometrics with progression to isotonics. Pt due for progress note next visit.   Denese Finn, PT, DPT #[id number]  Anita Mendoza, PT 01/17/2024, 8:20 AM

## 2024-01-23 ENCOUNTER — Ambulatory Visit: Admitting: Physical Therapy

## 2024-01-23 NOTE — Therapy (Deleted)
 OUTPATIENT PHYSICAL THERAPY TREATMENT AND PROGRESS NOTE/RE-CERTIFICATION   Dates of reporting period  11/23/23   to   01/23/24   Patient Name: Anita Mendoza MRN: [id number] DOB:11/10/1970, 53 y.o., female Today's Date: 01/23/2024  END OF SESSION:       Past Medical History:  Diagnosis Date   Asthma 2014   Hypertension    Past Surgical History:  Procedure Laterality Date   ABDOMINAL HYSTERECTOMY     carpul tunnel Bilateral 2011   choleycystectomy N/A 2000   ROTATOR CUFF REPAIR Left 2012   TONSILLECTOMY N/A 1979   Patient Active Problem List   Diagnosis Date Noted   Pain in right shoulder 05/01/2023    PCP: Neda Balk, MD  REFERRING PROVIDER: Rozann Cornell, PA-C  REFERRING DIAG:  [phone] (ICD-10-CM) - Pain in left shoulder  G89.29 (ICD-10-CM) - Other chronic pain  M67.814 (ICD-10-CM) - Other specified disorders of tendon, left shoulder    RATIONALE FOR EVALUATION AND TREATMENT: Rehabilitation  THERAPY DIAG: Left shoulder pain, unspecified chronicity  Stiffness of left shoulder, not elsewhere classified  Muscle weakness (generalized)  ONSET DATE: October 2024  FOLLOW-UP APPT SCHEDULED WITH REFERRING PROVIDER: None currently scheduled in EMR  PERTINENT HISTORY:  Pt is a 53 year old female known to this clinic - currently referred for L shoulder pain. Hx of R shoulder arthroscopy 10/13/22 with biceps tenodesis with prolonged rehab process due to persistent pain. Hx of L RCR in 2012.   Pt is remaining deficits with her R shoulder that were present during her previous episode of care. Pt reports pain affecting biceps region on both sides. Past med Hx of HTN, asthma. Pt needs help for dressing and pays for getting hair done.   Patient reports pain at night and disturbed sleep secondary to shoulder pain. She reports numbness/tingling that can go down to L wrist; some remote history of neck pain. Pt massages area with CBD cream, and she feels that his  helps along upper quarter with her pain.    PAIN:  Pain Intensity: Present: 7/10, Best: 5/10, Worst: 9/10 Pain location: L>R shoulder, biceps mm belly, anterior shoulder Pain Quality: electric, "running" pain down L upper limb, numbness/tingling   Radiating: Yes , down to L arm/forearm and as far as wrist Numbness/Tingling: Yes; down L upper limb  Focal Weakness: Yes; unable to hold heavy items in either upper limb  Aggravating factors: reaching upward, lifting heavier items, dressing activities  Relieving factors: CBD cream, not using arms as much;  24-hour pain behavior: worse in AM  History of prior shoulder or neck/shoulder injury, pain, surgery, or therapy: Yes; L RCR in 2012, R shoulder arthroscopy with biceps tenodesis/SAD in 09/2022 Falls: Has patient fallen in last 6 months? No, Number of falls: N/A Dominant hand: right Imaging: Yes ;  IMPRESSION:  Left shoulder: 1. Moderate supraspinatus tendinosis with fraying of bursal surface fibers. Mild subacromial subdeltoid bursal distention. 2. Severe subscapularis tendinosis with a high-grade, partial-thickness tear of the subscapularis tendon. there is a severe subscapularis muscle atrophy with fatty infiltration. 3. Mild acromioclavicular osteoarthrosis. 4. Moderate biceps tendinosis.    Red flags (personal history of cancer, chills/fever, night sweats, nausea, vomiting, unrelenting pain, unexplained weight gain/loss): Negative  PRECAUTIONS: Latex allergy  WEIGHT BEARING RESTRICTIONS: No  FALLS: Has patient fallen in last 6 months? 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  Recent memory is intact.  Remote memory is intact.  Attention span and concentration are intact.  Expressive speech is intact.  Patient's fund of knowledge is within normal limits for educational level.    Gross Musculoskeletal Assessment Tremor: None Bulk: Normal Tone: Normal   Posture Rounded shoulders, mild protracted scapulae at rest  Cervical Screen* AROM: WFL and painless with overpressure in all planes Spurlings A (ipsilateral lateral flexion/axial compression): R: Negative L: Negative Distraction: Negative for alleviation/change in concordant pain  Hoffman Sign (cervical cord compression): R: Negative L: Negative   AROM AROM (Normal range in degrees) AROM 11/28/23 AROM 01/23/24   Right Left Right Left  Shoulder      Flexion 98 110    Extension      Abduction 136 80    External Rotation (arm at side) 82 64    Internal Rotation      Hands Behind Head C6 L upper trap    Hands Behind Back T12 L1          Elbow      Flexion WNL WNL    Extension WNL WNL    Pronation WNL WNL    Supination WNL WNL    (* = pain; Blank rows = not tested)  UE MMT: MMT (out of 5) Right 11/28/23 Left 11/28/23 Right 01/23/24 Left 01/23/24  Cervical (isometric)    Flexion WNL    Extension WNL    Lateral Flexion WNL WNL    Rotation WNL WNL          Shoulder     Flexion 4-* 4-*    Extension      Abduction 4* 4*    External rotation 4* 4    Internal rotation 4* 4*    Horizontal abduction      Horizontal adduction      Lower Trapezius      Rhomboids            (* = pain; Blank rows = not tested)   Sensation Deferred  Reflexes Deferred  Palpation Location LEFT  RIGHT           Subocciptials    Cervical paraspinals    Upper Trapezius    Levator Scapulae    Rhomboid Major/Minor     Sternoclavicular joint    Acromioclavicular joint 1 1  Coracoid process 1 1  Long head of biceps 2 2  Supraspinatus 1 1  Infraspinatus    Subscapularis    Teres Minor    Teres Major    Pectoralis Major 1 1  Pectoralis Minor    Anterior Deltoid 1 2  Lateral Deltoid 1 2  Posterior Deltoid 1 1  Latissimus Dorsi    Sternocleidomastoid    (Blank rows = not tested) Graded on 0-4 scale (0 = no pain, 1 = pain, 2 = pain with wincing/grimacing/flinching, 3 = pain with withdrawal, 4 = unwilling to allow palpation), (Blank rows = not tested)   PASSIVE ACCESSORIES Glenohumeral: inferior glide WNL, posterior glide moderate restriction  Scapulothoracic: upward glide moderate restriction, posterior tilt mild restriction   SPECIAL TESTS  Bear Hug: R Negative, L Negative  Rotator Cuff  Drop Arm Test:  R Negative, L Negative Painful Arc (Pain from 60 to 120 degrees scaption): R Positive, L Positive External Rotation Resistance: R Posiitve, L Negative    Bicep Tendon Pathology Speed (shoulder flexion to 90, external rotation, full elbow extension, and forearm supination with resistance: R Mild pain, L Positive (significant pain) Yergason's (resisted shoulder ER and supination/biceps tendon pathology): R Negative, L Positive for mild shoulder pain      TODAY'S TREATMENT 01/23/2024    SUBJECTIVE STATEMENT:   Patient reports 4/10 pain at arrival. Patient reports doing relatively well with her HEP and updated resistance bands. Patient reports doing well after her last visit.    Upper body ergometer, 2 minutes forward, 2 minutes backward - for tissue warm-up to improve muscle performance, improved soft tissue mobility/extensibility - subjective gathered during this time intermittently, 2 min not billed   Manual Therapy - for symptom modulation, soft tissue sensitivity and mobility, joint mobility, ROM   In supine: L shoulder PROM within pt tolerance within pt tolerance ; x 5 min GHJ mob  gr III inferior for mobility, I-II A-P for pain control; 2 x 30 sec bouts each STM L biceps and anterior/lateral deltoid; x 2 min   *not today* STM/DTM L upper trapezius; x 3 min Glenohumeral joint mobilizations; gr III for inferior glide; 3 x 30 sec bouts Scapular mobilization; 2 x 30 sec bouts for posterior tilt, elevation, and upward rotation     Therapeutic Exercise - for improved soft tissue flexibility and extensibility as needed for ROM, improved strength as needed to improve performance of CKC activities/functional movements   Latex-free Theraband*  Reclined shoulder flexion, bilat; 2 x 12; with 1-lb Dells  Tband single-arm high row for shoulder flexion AAROM during eccentric phase, Red latex-free band (2nd hook from top); 1 x 10 with each UE  Standing shoulder flexion AAROM with dowel, bilat; 2 x 10  Alternating shoulder tap; CKC on wall in standing, slight forward lean; 2 x 10 alt  PATIENT EDUCATION: Discussed use of ice at home prn, prognosis, PT POC with expected progression of RTC and periscapular strengthening.     *not today* Cold pack (unbilled) - for anti-inflammatory and analgesic effect as needed for reduced pain and improved ability to participate in active PT intervention, along L shoulder in sitting, x 5 minutes Standing shoulder flexion AAROM with dowel, bilat; 2 x 10 Supine shoulder flexion AROM; 1 x 10 Supine shoulder flexion AROM with 1-lb Dbell, bilat; 2 x 10 Standing bilateral row with Blue Tband; 2 x 10, 3 sec hold Standing adduction with Red Tband; ABD assist; 2 x 10 with LUE Serratus punch at well, CKC punch; 2 x 10 Red Tband bilateral ER; 2 x 10 Bilateral shoulder flexion reactive isometric, Red Tband 20x L shoulder IR reactive isometric walkout; Red Tband; 2x10 Serratus punch with 3-lb Dbell; 1x15, LUE only Serratus punch with dowel; 2 x 10 Cold pack (unbilled) - for anti-inflammatory and analgesic effect as needed for reduced pain and  improved ability to participate in active PT intervention, along bilat shoulders in sitting, x 5 minutes   PATIENT EDUCATION:  Education details: see above for patient education details Person educated: Patient Education method: Explanation, Demonstration, and Handouts Education comprehension: verbalized understanding and returned demonstration   HOME EXERCISE PROGRAM:  Access Code: [email or web address] URL: [email or web address] Date: 12/19/2023 Prepared by: Denese Finn  Exercises - Standing Shoulder Flexion Reactive Isometrics with Elbow Extended  - 1 x daily - 7 x weekly - 2 sets - 10  reps - Shoulder Internal Rotation Reactive Isometrics  - 1 x daily - 7 x weekly - 2 sets - 10 reps - Shoulder External Rotation and Scapular Retraction with Resistance  - 1 x daily - 7 x weekly - 2 sets - 10 reps - Standing Shoulder Row with Anchored Resistance  - 1 x daily - 7 x weekly - 2 sets - 10 reps - 3sec hold      ASSESSMENT:  CLINICAL IMPRESSION:   Patient demonstrates improved tolerance of forward elevation with increase in volume for shoulder flexion in reclined position with light resistance. Pt tolerates all drills relatively well today with only exception being standing adduction and ABD assist with Tband. Pt is notably challenged with shoulder complex abduction ROM actively, though passive motion is essentially unrestricted. Pt has bilateral deficits in shoulder A/PROM, RTC and deltoid strength, anterior shoulder/upper arm pain, L upper limb paresthesias intermittently. Pt will continue to benefit from skilled PT services to address deficits and improve function.   OBJECTIVE IMPAIRMENTS: decreased ROM, decreased strength, hypomobility, impaired flexibility, impaired UE functional use, postural dysfunction, and pain.   ACTIVITY LIMITATIONS: carrying, lifting, bathing, dressing, reach over head, and hygiene/grooming  PARTICIPATION LIMITATIONS: meal prep, cleaning, laundry, driving,  shopping, and community activity  PERSONAL FACTORS: Past/current experiences, Time since onset of injury/illness/exacerbation, and 3+ comorbidities: (Asthma, HTN, hx of L RCR, and hx of R shoulder arthroscopy with poor progress) are also affecting patient's functional outcome.   REHAB POTENTIAL: Fair Hx of R shoulder arthroscopy with biceps tenodesis, bursectomy, debridement with poor post-op rehab progress  CLINICAL DECISION MAKING: Unstable/unpredictable  EVALUATION COMPLEXITY: High   GOALS: Goals reviewed with patient? Yes  SHORT TERM GOALS: Target date: 12/14/2023  Pt will be independent with HEP to improve strength and decrease shoulder pain to improve pain-free function at home and work. Baseline: 11/23/23: Baseline HEP reviewed, 3-way shoulder isometrics (see [employer] program).     01/23/24: *** Goal status: INITIAL   LONG TERM GOALS: Target date: 01/04/2024  Pt will have forward elevation of bilateral shoulders to 120 deg or greater as needed for self-care and ADLs Baseline: 11/23/23: Forward flexion AROM R 98, L 110.     01/23/24: *** Goal status: INITIAL  2.  Pt will decrease worst shoulder pain by at least 3 points on the NPRS in order to demonstrate clinically significant reduction in shoulder pain. Baseline: 11/23/23: 10/10 at worst.        01/23/24: *** Goal status: INITIAL  3.  Pt will decrease quick DASH score to less than or equal to 20% in order to demonstrate clinically significant reduction in disability related to shoulder pain    Baseline: 11/23/23: 84%.        01/23/24: *** Goal status: INITIAL  4. Pt will increase strength by to 4+/5 or greater MMT grade for assessed shoulder musculature in order to demonstrate improvement in strength and function      Baseline: 11/23/23: Strength 4- to 4 with pain during testing.        01/23/24: *** Goal status: INITIAL  5. Pt will achieve functional ER to ipsilateral scapula and functional IR to bra line as needed for dressing,  self-care ADLs     Baseline: 11/23/23: Limited functional ER bilat (see chart above for hand behind head/back).        01/23/24: *** Goal status: INITIAL  PLAN: PT FREQUENCY: 1-2x/week  PT DURATION: 6 weeks  PLANNED INTERVENTIONS: Therapeutic exercises, Therapeutic activity, Neuromuscular re-education, Balance training, Gait  training, Patient/Family education, Self Care, Joint mobilization, Joint manipulation, Vestibular training, Canalith repositioning, Orthotic/Fit training, DME instructions, Dry Needling, Electrical stimulation, Spinal manipulation, Spinal mobilization, Cryotherapy, Moist heat, Taping, Traction, Ultrasound, Ionotophoresis 4mg /ml Dexamethasone, Manual therapy, and Re-evaluation.  PLAN FOR NEXT SESSION:  Continue PT with progressive AROM and addition of weight/progressive load for long-lever flexion as tolerated. RTC and LHB isometrics with progression to isotonics.    Denese Finn, PT, DPT #[id number]  Aleatha Hunting, PT 01/23/2024, 12:09 PM

## 2024-01-25 ENCOUNTER — Ambulatory Visit: Admitting: Physical Therapy

## 2024-01-25 DIAGNOSIS — M25512 Pain in left shoulder: Secondary | ICD-10-CM | POA: Diagnosis not present

## 2024-01-25 DIAGNOSIS — M6281 Muscle weakness (generalized): Secondary | ICD-10-CM

## 2024-01-25 DIAGNOSIS — M25612 Stiffness of left shoulder, not elsewhere classified: Secondary | ICD-10-CM

## 2024-01-25 NOTE — Therapy (Signed)
 OUTPATIENT PHYSICAL THERAPY TREATMENT AND PROGRESS NOTE/RE-CERTIFICATION   Dates of reporting period  11/23/23   to   01/23/24   Patient Name: Anita Mendoza MRN: [id number] DOB:18-Feb-1971, 53 y.o., female Today's Date: 01/25/2024  END OF SESSION:  PT End of Session - 01/25/24 1301     Visit Number 10    Number of Visits 13    Date for PT Re-Evaluation 01/04/24    Authorization Type [employer] 2025    PT Start Time 1301    PT Stop Time 1343    PT Time Calculation (min) 42 min    Activity Tolerance Patient tolerated treatment well    Behavior During Therapy [facility] for tasks assessed/performed          Past Medical History:  Diagnosis Date   Asthma 2014   Hypertension    Past Surgical History:  Procedure Laterality Date   ABDOMINAL HYSTERECTOMY     carpul tunnel Bilateral 2011   choleycystectomy N/A 2000   ROTATOR CUFF REPAIR Left 2012   TONSILLECTOMY N/A 1979   Patient Active Problem List   Diagnosis Date Noted   Pain in right shoulder 05/01/2023    PCP: Neda Balk, MD  REFERRING PROVIDER: Rozann Cornell, PA-C  REFERRING DIAG:  [phone] (ICD-10-CM) - Pain in left shoulder  G89.29 (ICD-10-CM) - Other chronic pain  M67.814 (ICD-10-CM) - Other specified disorders of tendon, left shoulder    RATIONALE FOR EVALUATION AND TREATMENT: Rehabilitation  THERAPY DIAG: Left shoulder pain, unspecified chronicity  Stiffness of left shoulder, not elsewhere classified  Muscle weakness (generalized)  ONSET DATE: October 2024  FOLLOW-UP APPT SCHEDULED WITH REFERRING PROVIDER: None currently scheduled in EMR  PERTINENT HISTORY:  Pt is a 53 year old female known to this clinic - currently referred for L shoulder pain. Hx of R shoulder arthroscopy 10/13/22 with biceps tenodesis with prolonged rehab process due to persistent pain. Hx of L RCR in 2012.   Pt is remaining deficits with her R shoulder that were present during her previous episode of care. Pt  reports pain affecting biceps region on both sides. Past med Hx of HTN, asthma. Pt needs help for dressing and pays for getting hair done.   Patient reports pain at night and disturbed sleep secondary to shoulder pain. She reports numbness/tingling that can go down to L wrist; some remote history of neck pain. Pt massages area with CBD cream, and she feels that his helps along upper quarter with her pain.    PAIN:  Pain Intensity: Present: 7/10, Best: 5/10, Worst: 9/10 Pain location: L>R shoulder, biceps mm belly, anterior shoulder Pain Quality: electric, running pain down L upper limb, numbness/tingling   Radiating: Yes , down to L arm/forearm and as far as wrist Numbness/Tingling: Yes; down L upper limb  Focal Weakness: Yes; unable to hold heavy items in either upper limb  Aggravating factors: reaching upward, lifting heavier items, dressing activities  Relieving factors: CBD cream, not using arms as much;  24-hour pain behavior: worse in AM  History of prior shoulder or neck/shoulder injury, pain, surgery, or therapy: Yes; L RCR in 2012, R shoulder arthroscopy with biceps tenodesis/SAD in 09/2022 Falls: Has patient fallen in last 6 months? No, Number of falls: N/A Dominant hand: right Imaging: Yes ;  IMPRESSION:  Left shoulder: 1. Moderate supraspinatus tendinosis with fraying of bursal surface fibers. Mild subacromial subdeltoid bursal distention. 2. Severe subscapularis tendinosis with a high-grade, partial-thickness tear of the subscapularis tendon. there is a severe subscapularis muscle  atrophy with fatty infiltration. 3. Mild acromioclavicular osteoarthrosis. 4. Moderate biceps tendinosis.    Red flags (personal history of cancer, chills/fever, night sweats, nausea, vomiting, unrelenting pain, unexplained weight gain/loss): Negative  PRECAUTIONS: Latex allergy  WEIGHT BEARING RESTRICTIONS: No  FALLS: Has patient fallen in last 6 months? 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  Recent memory is intact.  Remote memory is intact.  Attention span and concentration are intact.  Expressive speech is intact.  Patient's fund of knowledge is within normal limits for educational level.    Gross Musculoskeletal Assessment Tremor: None Bulk: Normal Tone: Normal   Posture Rounded shoulders, mild protracted scapulae at rest  Cervical Screen* AROM: WFL and painless with overpressure in all planes Spurlings A (ipsilateral lateral flexion/axial compression): R: Negative L: Negative Distraction: Negative for alleviation/change in concordant pain  Hoffman Sign (cervical cord compression): R: Negative L: Negative   AROM AROM (Normal range in degrees) AROM 11/28/23 AROM 01/23/24   Right Left Right Left  Shoulder      Flexion 98 110 155 148  Extension      Abduction 136 80 163 140  External Rotation (arm at side) 82 64    Internal Rotation      Hands Behind Head C6 L upper trap T2 T3  Hands Behind Back T12 L1 T12 L3        Elbow      Flexion WNL WNL    Extension WNL WNL    Pronation WNL WNL    Supination WNL WNL    (* = pain; Blank rows = not tested)  UE MMT: MMT (out of 5) Right 11/28/23 Left 11/28/23 Right 01/23/24 Left 01/23/24        Shoulder     Flexion 4-* 4-* 4-* 3+*  Extension      Abduction 4* 4* 4* 3+*  External rotation 4* 4 4+ 4  Internal rotation 4* 4* 4+ 4*  Horizontal abduction      Horizontal adduction       Lower Trapezius      Rhomboids            (* = pain; Blank rows = not tested)   Sensation Deferred  Reflexes Deferred  Palpation Location LEFT  RIGHT           Subocciptials    Cervical paraspinals    Upper Trapezius    Levator Scapulae    Rhomboid Major/Minor    Sternoclavicular joint    Acromioclavicular joint 1 1  Coracoid process 1 1  Long head of biceps 2 2  Supraspinatus 1 1  Infraspinatus    Subscapularis    Teres Minor    Teres Major    Pectoralis Major 1 1  Pectoralis Minor    Anterior Deltoid 1 2  Lateral Deltoid 1 2  Posterior Deltoid 1 1  Latissimus Dorsi    Sternocleidomastoid    (Blank rows = not tested) Graded on 0-4 scale (0 = no pain, 1 = pain, 2 = pain with wincing/grimacing/flinching, 3 =  pain with withdrawal, 4 = unwilling to allow palpation), (Blank rows = not tested)   PASSIVE ACCESSORIES Glenohumeral: inferior glide WNL, posterior glide moderate restriction  Scapulothoracic: upward glide moderate restriction, posterior tilt mild restriction   SPECIAL TESTS  Bear Hug: R Negative, L Negative  Rotator Cuff  Drop Arm Test: R Negative, L Negative Painful Arc (Pain from 60 to 120 degrees scaption): R Positive, L Positive External Rotation Resistance: R Posiitve, L Negative    Bicep Tendon Pathology Speed (shoulder flexion to 90, external rotation, full elbow extension, and forearm supination with resistance: R Mild pain, L Positive (significant pain) Yergason's (resisted shoulder ER and supination/biceps tendon pathology): R Negative, L Positive for mild shoulder pain      TODAY'S TREATMENT 01/25/2024    SUBJECTIVE STATEMENT:   Patient reports 5/10 pain at arrival; states this is not that bad today. Patient reports doing well with her HEP. Patient reports 40% global rating of function at this time. She reports some challenge with self-care ADLs, but she does do them in spite of pain with accessing ROM. Patient reports difficulty at  night time with getting adequate sleep and comfort due to bilateral shoulder pain with Hx of poor progress with R shoulder arthroscopy in 2024.    Upper body ergometer, 2 minutes forward, 2 minutes backward - for tissue warm-up to improve muscle performance, improved soft tissue mobility/extensibility - subjective gathered during this time intermittently, 2 min not billed   *GOAL UPDATE PERFORMED   Manual Therapy - for symptom modulation, soft tissue sensitivity and mobility, joint mobility, ROM   In supine: L shoulder PROM within pt tolerance within pt tolerance ; x 5 min GHJ mob gr III inferior for mobility, I-II A-P for pain control; 2 x 30 sec bouts each STM L biceps and anterior/lateral deltoid; x 2 min   *not today* STM/DTM L upper trapezius; x 3 min Glenohumeral joint mobilizations; gr III for inferior glide; 3 x 30 sec bouts Scapular mobilization; 2 x 30 sec bouts for posterior tilt, elevation, and upward rotation     Therapeutic Exercise - for improved soft tissue flexibility and extensibility as needed for ROM, improved strength as needed to improve performance of CKC activities/functional movements   Reclined shoulder flexion, bilat; 1x10 with 2-lb Dbells, 1x1  Serratus slide with foam roller; 2 x 10  PATIENT EDUCATION: Discussed current progress with PT, prognosis, plan of care.    *next visit* Alternating shoulder tap; CKC on wall in standing, slight forward lean; 2 x 10 alt   *not today* Standing shoulder flexion AAROM with dowel, bilat; 2 x 10 Tband single-arm high row for shoulder flexion AAROM during eccentric phase, Red latex-free band (2nd hook from top); 1 x 10 with each UE Cold pack (unbilled) - for anti-inflammatory and analgesic effect as needed for reduced pain and improved ability to participate in active PT intervention, along L shoulder in sitting, x 5 minutes Standing shoulder flexion AAROM with dowel, bilat; 2 x 10 Supine shoulder flexion AROM;  1 x 10 Supine shoulder flexion AROM with 1-lb Dbell, bilat; 2 x 10 Standing bilateral row with Blue Tband; 2 x 10, 3 sec hold Standing adduction with Red Tband; ABD assist; 2 x 10 with LUE Serratus punch at well, CKC punch; 2 x 10 Red Tband bilateral ER; 2 x 10 Bilateral shoulder flexion reactive isometric, Red Tband 20x L shoulder IR reactive isometric walkout; Red Tband; 2x10 Serratus punch with 3-lb Dbell; 1x15, LUE only Serratus punch with  dowel; 2 x 10 Cold pack (unbilled) - for anti-inflammatory and analgesic effect as needed for reduced pain and improved ability to participate in active PT intervention, along bilat shoulders in sitting, x 5 minutes   PATIENT EDUCATION:  Education details: see above for patient education details Person educated: Patient Education method: Explanation, Demonstration, and Handouts Education comprehension: verbalized understanding and returned demonstration   HOME EXERCISE PROGRAM:  Access Code: [email or web address] URL: [email or web address] Date: 12/19/2023 Prepared by: Denese Finn  Exercises - Standing Shoulder Flexion Reactive Isometrics with Elbow Extended  - 1 x daily - 7 x weekly - 2 sets - 10 reps - Shoulder Internal Rotation Reactive Isometrics  - 1 x daily - 7 x weekly - 2 sets - 10 reps - Shoulder External Rotation and Scapular Retraction with Resistance  - 1 x daily - 7 x weekly - 2 sets - 10 reps - Standing Shoulder Row with Anchored Resistance  - 1 x daily - 7 x weekly - 2 sets - 10 reps - 3sec hold      ASSESSMENT:  CLINICAL IMPRESSION:   Patient unfortunately has chronic pain and mobility deficits in spite of R shoulder arthroscopy and extended time in post-op rehab in 2024. She has bilateral deficits in ROM and strength. Bilateral AROM has significantly improved to functional forward flexion ROM, though she does have pain with lowering arm back to resting position. Pt does not have notably improved strength with primary  focus being on pain/mobility thus far in PT. Pt has clinically significant reduction in disability score per QuickDASH; NPRS at worst has slightly improved. Pt demonstrates fair functional ER AROM, but functional IR (hand behind back) is still markedly limited bilaterally. Pt has bilateral deficits in shoulder A/PROM, RTC and deltoid strength, bilateral anterior shoulder/upper arm pain, L upper limb paresthesias intermittently. Pt will continue to benefit from skilled PT services to address deficits and improve function.   OBJECTIVE IMPAIRMENTS: decreased ROM, decreased strength, hypomobility, impaired flexibility, impaired UE functional use, postural dysfunction, and pain.   ACTIVITY LIMITATIONS: carrying, lifting, bathing, dressing, reach over head, and hygiene/grooming  PARTICIPATION LIMITATIONS: meal prep, cleaning, laundry, driving, shopping, and community activity  PERSONAL FACTORS: Past/current experiences, Time since onset of injury/illness/exacerbation, and 3+ comorbidities: (Asthma, HTN, hx of L RCR, and hx of R shoulder arthroscopy with poor progress) are also affecting patient's functional outcome.   REHAB POTENTIAL: Fair Hx of R shoulder arthroscopy with biceps tenodesis, bursectomy, debridement with poor post-op rehab progress  CLINICAL DECISION MAKING: Unstable/unpredictable  EVALUATION COMPLEXITY: High   GOALS: Goals reviewed with patient? Yes  SHORT TERM GOALS: Target date: 12/14/2023  Pt will be independent with HEP to improve strength and decrease shoulder pain to improve pain-free function at home and work. Baseline: 11/23/23: Baseline HEP reviewed, 3-way shoulder isometrics (see [employer] program).     01/25/24: Pt is largely compliant with HEP, verbalizes understanding of exercises given.  Goal status: ACHIEVED   LONG TERM GOALS: Target date: 03/07/24  Pt will have forward elevation of bilateral shoulders to 120 deg or greater as needed for self-care and ADLs Baseline:  11/23/23: Forward flexion AROM R 98, L 110.     01/25/24: >120 deg for each with some pain reproduction with elevation and eccentric phase/return to neutral.  Goal status: MOSTLY MET  2.  Pt will decrease worst shoulder pain by at least 3 points on the NPRS in order to demonstrate clinically significant reduction in shoulder pain. Baseline: 11/23/23: 10/10 at worst.  01/25/24: 8/10 at worst.  Goal status: IN PROGRESS  3.  Pt will decrease quick DASH score to less than or equal to 20% in order to demonstrate clinically significant reduction in disability related to shoulder pain    Baseline: 11/23/23: 84%.        01/25/24: 59.1% Goal status: IN PROGRESS  4. Pt will increase strength by to 4+/5 or greater MMT grade for assessed shoulder musculature in order to demonstrate improvement in strength and function      Baseline: 11/23/23: Strength 4- to 4 with pain during testing.        01/25/24: No notable improvement in strength  Goal status: NOT MET   5. Pt will achieve functional ER to ipsilateral scapula and functional IR to bra line as needed for dressing, self-care ADLs     Baseline: 11/23/23: Limited functional ER bilat (see chart above for hand behind head/back).        01/25/24: Met for functional ER/no improvement with functional IR.  Goal status: PARTIALLY MET/IN PROGRESS  PLAN: PT FREQUENCY: 1-2x/week  PT DURATION: 4 weeks  PLANNED INTERVENTIONS: Therapeutic exercises, Therapeutic activity, Neuromuscular re-education, Balance training, Gait training, Patient/Family education, Self Care, Joint mobilization, Joint manipulation, Vestibular training, Canalith repositioning, Orthotic/Fit training, DME instructions, Dry Needling, Electrical stimulation, Spinal manipulation, Spinal mobilization, Cryotherapy, Moist heat, Taping, Traction, Ultrasound, Ionotophoresis 4mg /ml Dexamethasone, Manual therapy, and Re-evaluation.  PLAN FOR NEXT SESSION:  Continue PT with progressive AROM and addition of  weight/progressive load for long-lever flexion as tolerated. RTC and LHB isometrics with progression to isotonics including periscapular strengthening.     Denese Finn, PT, DPT #[id number]  Aleatha Hunting, PT 01/25/2024, 1:01 PM

## 2024-01-26 ENCOUNTER — Encounter: Payer: Self-pay | Admitting: Physical Therapy

## 2024-01-30 ENCOUNTER — Ambulatory Visit: Admitting: Physical Therapy

## 2024-02-01 ENCOUNTER — Ambulatory Visit: Admitting: Physical Therapy

## 2024-02-05 ENCOUNTER — Ambulatory Visit: Admitting: Physical Therapy

## 2024-02-07 ENCOUNTER — Ambulatory Visit: Admitting: Physical Therapy

## 2024-02-07 NOTE — Therapy (Deleted)
 OUTPATIENT PHYSICAL THERAPY TREATMENT  Patient Name: Anita Mendoza MRN: [id number] DOB:1970/09/13, 53 y.o., female Today's Date: 02/07/2024  END OF SESSION:    Past Medical History:  Diagnosis Date   Asthma 2014   Hypertension    Past Surgical History:  Procedure Laterality Date   ABDOMINAL HYSTERECTOMY     carpul tunnel Bilateral 2011   choleycystectomy N/A 2000   ROTATOR CUFF REPAIR Left 2012   TONSILLECTOMY N/A 1979   Patient Active Problem List   Diagnosis Date Noted   Pain in right shoulder 05/01/2023    PCP: Laverda Haw, MD  REFERRING PROVIDER: Charmaine Cy Kief, PA-C  REFERRING DIAG:  [phone] (ICD-10-CM) - Pain in left shoulder  G89.29 (ICD-10-CM) - Other chronic pain  M67.814 (ICD-10-CM) - Other specified disorders of tendon, left shoulder    RATIONALE FOR EVALUATION AND TREATMENT: Rehabilitation  THERAPY DIAG: Left shoulder pain, unspecified chronicity  Stiffness of left shoulder, not elsewhere classified  Muscle weakness (generalized)  ONSET DATE: October 2024  FOLLOW-UP APPT SCHEDULED WITH REFERRING PROVIDER: None currently scheduled in EMR  PERTINENT HISTORY:  Pt is a 53 year old female known to this clinic - currently referred for L shoulder pain. Hx of R shoulder arthroscopy 10/13/22 with biceps tenodesis with prolonged rehab process due to persistent pain. Hx of L RCR in 2012.   Pt is remaining deficits with her R shoulder that were present during her previous episode of care. Pt reports pain affecting biceps region on both sides. Past med Hx of HTN, asthma. Pt needs help for dressing and pays for getting hair done.   Patient reports pain at night and disturbed sleep secondary to shoulder pain. She reports numbness/tingling that can go down to L wrist; some remote history of neck pain. Pt massages area with CBD cream, and she feels that his helps along upper quarter with her pain.    PAIN:  Pain Intensity: Present: 7/10, Best: 5/10,  Worst: 9/10 Pain location: L>R shoulder, biceps mm belly, anterior shoulder Pain Quality: electric, running pain down L upper limb, numbness/tingling   Radiating: Yes , down to L arm/forearm and as far as wrist Numbness/Tingling: Yes; down L upper limb  Focal Weakness: Yes; unable to hold heavy items in either upper limb  Aggravating factors: reaching upward, lifting heavier items, dressing activities  Relieving factors: CBD cream, not using arms as much;  24-hour pain behavior: worse in AM  History of prior shoulder or neck/shoulder injury, pain, surgery, or therapy: Yes; L RCR in 2012, R shoulder arthroscopy with biceps tenodesis/SAD in 09/2022 Falls: Has patient fallen in last 6 months? No, Number of falls: N/A Dominant hand: right Imaging: Yes ;  IMPRESSION:  Left shoulder: 1. Moderate supraspinatus tendinosis with fraying of bursal surface fibers. Mild subacromial subdeltoid bursal distention. 2. Severe subscapularis tendinosis with a high-grade, partial-thickness tear of the subscapularis tendon. there is a severe subscapularis muscle atrophy with fatty infiltration. 3. Mild acromioclavicular osteoarthrosis. 4. Moderate biceps tendinosis.    Red flags (personal history of cancer, chills/fever, night sweats, nausea, vomiting, unrelenting pain, unexplained weight gain/loss): Negative  PRECAUTIONS: Latex allergy  WEIGHT BEARING RESTRICTIONS: No  FALLS: Has patient fallen in last 6 months? 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  Recent memory is intact.  Remote memory is intact.  Attention span and concentration are intact.  Expressive speech is intact.  Patient's fund of knowledge is within normal limits for educational level.    Gross Musculoskeletal Assessment Tremor: None Bulk: Normal Tone: Normal   Posture Rounded shoulders, mild protracted scapulae at rest  Cervical Screen* AROM: WFL and painless with overpressure in all planes Spurlings A (ipsilateral lateral flexion/axial compression): R: Negative L: Negative Distraction: Negative for alleviation/change in concordant pain  Hoffman Sign (cervical cord compression): R: Negative L: Negative   AROM AROM (Normal range in degrees) AROM 11/28/23 AROM 01/23/24   Right Left Right Left  Shoulder      Flexion 98 110 155 148  Extension      Abduction 136 80 163 140  External Rotation (arm at side) 82 64    Internal Rotation      Hands Behind Head C6 L upper trap T2 T3  Hands Behind Back T12 L1 T12 L3        Elbow      Flexion WNL WNL    Extension WNL WNL    Pronation WNL WNL    Supination WNL WNL    (* = pain; Blank rows = not tested)  UE MMT: MMT (out of 5) Right 11/28/23 Left 11/28/23 Right 01/23/24 Left 01/23/24        Shoulder     Flexion 4-* 4-* 4-* 3+*  Extension      Abduction 4* 4* 4* 3+*  External rotation 4* 4 4+ 4  Internal rotation 4* 4* 4+ 4*  Horizontal abduction      Horizontal adduction      Lower Trapezius      Rhomboids            (* = pain; Blank rows = not tested)   Sensation Deferred  Reflexes Deferred  Palpation Location LEFT  RIGHT           Subocciptials    Cervical paraspinals    Upper Trapezius    Levator Scapulae    Rhomboid Major/Minor    Sternoclavicular joint    Acromioclavicular joint 1 1  Coracoid process 1 1  Long head of biceps 2 2  Supraspinatus 1 1  Infraspinatus    Subscapularis    Teres Minor     Teres Major    Pectoralis Major 1 1  Pectoralis Minor    Anterior Deltoid 1 2  Lateral Deltoid 1 2  Posterior Deltoid 1 1  Latissimus Dorsi    Sternocleidomastoid    (Blank rows = not tested) Graded on 0-4 scale (0 = no pain, 1 = pain, 2 = pain with wincing/grimacing/flinching, 3 = pain with withdrawal, 4 = unwilling to allow palpation), (Blank rows = not tested)   PASSIVE ACCESSORIES Glenohumeral: inferior glide WNL, posterior glide moderate restriction  Scapulothoracic: upward glide moderate restriction, posterior tilt mild restriction   SPECIAL TESTS  Bear Hug: R Negative, L Negative  Rotator Cuff  Drop Arm Test: R Negative, L Negative Painful Arc (Pain from 60 to 120 degrees scaption): R Positive, L Positive External Rotation Resistance: R Posiitve, L Negative    Bicep Tendon Pathology Speed (shoulder flexion to 90, external rotation, full elbow extension, and forearm supination with resistance: R Mild pain, L  Positive (significant pain) Yergason's (resisted shoulder ER and supination/biceps tendon pathology): R Negative, L Positive for mild shoulder pain      TODAY'S TREATMENT 02/07/2024    SUBJECTIVE STATEMENT:   Patient reports 5/10 pain at arrival; states this is not that bad today. Patient reports doing well with her HEP. Patient reports 40% global rating of function at this time. She reports some challenge with self-care ADLs, but she does do them in spite of pain with accessing ROM. Patient reports difficulty at night time with getting adequate sleep and comfort due to bilateral shoulder pain with Hx of poor progress with R shoulder arthroscopy in 2024.    Upper body ergometer, 2 minutes forward, 2 minutes backward - for tissue warm-up to improve muscle performance, improved soft tissue mobility/extensibility - subjective gathered during this time intermittently, 2 min not billed   Manual Therapy - for symptom modulation, soft tissue sensitivity and mobility,  joint mobility, ROM   In supine: L shoulder PROM within pt tolerance within pt tolerance ; x 5 min GHJ mob gr III inferior for mobility, I-II A-P for pain control; 2 x 30 sec bouts each STM L biceps and anterior/lateral deltoid; x 2 min   *not today* STM/DTM L upper trapezius; x 3 min Glenohumeral joint mobilizations; gr III for inferior glide; 3 x 30 sec bouts Scapular mobilization; 2 x 30 sec bouts for posterior tilt, elevation, and upward rotation     Therapeutic Exercise - for improved soft tissue flexibility and extensibility as needed for ROM, improved strength as needed to improve performance of CKC activities/functional movements   Reclined shoulder flexion, bilat; 1x10 with 2-lb Dbells, 1x1  Serratus slide with foam roller; 2 x 10  Standing adduction with Red Tband; ABD assist; 2 x 10 with LUE  Alternating shoulder tap; CKC on wall in standing, slight forward lean; 2 x 10 alt  Standing scaption with 1-lb Dbell; 2 x 8 ***  PATIENT EDUCATION: Discussed current progress with PT, prognosis, plan of care.    *not today* Standing shoulder flexion AAROM with dowel, bilat; 2 x 10 Tband single-arm high row for shoulder flexion AAROM during eccentric phase, Red latex-free band (2nd hook from top); 1 x 10 with each UE Cold pack (unbilled) - for anti-inflammatory and analgesic effect as needed for reduced pain and improved ability to participate in active PT intervention, along L shoulder in sitting, x 5 minutes Standing shoulder flexion AAROM with dowel, bilat; 2 x 10 Supine shoulder flexion AROM; 1 x 10 Supine shoulder flexion AROM with 1-lb Dbell, bilat; 2 x 10 Standing bilateral row with Blue Tband; 2 x 10, 3 sec hold Serratus punch at well, CKC punch; 2 x 10 Red Tband bilateral ER; 2 x 10 Bilateral shoulder flexion reactive isometric, Red Tband 20x L shoulder IR reactive isometric walkout; Red Tband; 2x10 Serratus punch with 3-lb Dbell; 1x15, LUE only Serratus punch with  dowel; 2 x 10 Cold pack (unbilled) - for anti-inflammatory and analgesic effect as needed for reduced pain and improved ability to participate in active PT intervention, along bilat shoulders in sitting, x 5 minutes   PATIENT EDUCATION:  Education details: see above for patient education details Person educated: Patient Education method: Explanation, Demonstration, and Handouts Education comprehension: verbalized understanding and returned demonstration   HOME EXERCISE PROGRAM:  Access Code: [email or web address] URL: [email or web address] Date: 12/19/2023 Prepared by: Venetia Endo  Exercises - Standing Shoulder Flexion Reactive Isometrics with Elbow Extended  - 1 x daily - 7 x  weekly - 2 sets - 10 reps - Shoulder Internal Rotation Reactive Isometrics  - 1 x daily - 7 x weekly - 2 sets - 10 reps - Shoulder External Rotation and Scapular Retraction with Resistance  - 1 x daily - 7 x weekly - 2 sets - 10 reps - Standing Shoulder Row with Anchored Resistance  - 1 x daily - 7 x weekly - 2 sets - 10 reps - 3sec hold      ASSESSMENT:  CLINICAL IMPRESSION:   Patient unfortunately has chronic pain and mobility deficits in spite of R shoulder arthroscopy and extended time in post-op rehab in 2024. She has bilateral deficits in ROM and strength. Bilateral AROM has significantly improved to functional forward flexion ROM, though she does have pain with lowering arm back to resting position. Pt does not have notably improved strength with primary focus being on pain/mobility thus far in PT. Pt has clinically significant reduction in disability score per QuickDASH; NPRS at worst has slightly improved. Pt demonstrates fair functional ER AROM, but functional IR (hand behind back) is still markedly limited bilaterally. Pt has bilateral deficits in shoulder A/PROM, RTC and deltoid strength, bilateral anterior shoulder/upper arm pain, L upper limb paresthesias intermittently. Pt will continue to  benefit from skilled PT services to address deficits and improve function.   OBJECTIVE IMPAIRMENTS: decreased ROM, decreased strength, hypomobility, impaired flexibility, impaired UE functional use, postural dysfunction, and pain.   ACTIVITY LIMITATIONS: carrying, lifting, bathing, dressing, reach over head, and hygiene/grooming  PARTICIPATION LIMITATIONS: meal prep, cleaning, laundry, driving, shopping, and community activity  PERSONAL FACTORS: Past/current experiences, Time since onset of injury/illness/exacerbation, and 3+ comorbidities: (Asthma, HTN, hx of L RCR, and hx of R shoulder arthroscopy with poor progress) are also affecting patient's functional outcome.   REHAB POTENTIAL: Fair Hx of R shoulder arthroscopy with biceps tenodesis, bursectomy, debridement with poor post-op rehab progress  CLINICAL DECISION MAKING: Unstable/unpredictable  EVALUATION COMPLEXITY: High   GOALS: Goals reviewed with patient? Yes  SHORT TERM GOALS: Target date: 12/14/2023  Pt will be independent with HEP to improve strength and decrease shoulder pain to improve pain-free function at home and work. Baseline: 11/23/23: Baseline HEP reviewed, 3-way shoulder isometrics (see MedBridge program).     01/25/24: Pt is largely compliant with HEP, verbalizes understanding of exercises given.  Goal status: ACHIEVED   LONG TERM GOALS: Target date: 03/07/24  Pt will have forward elevation of bilateral shoulders to 120 deg or greater as needed for self-care and ADLs Baseline: 11/23/23: Forward flexion AROM R 98, L 110.     01/25/24: >120 deg for each with some pain reproduction with elevation and eccentric phase/return to neutral.  Goal status: MOSTLY MET  2.  Pt will decrease worst shoulder pain by at least 3 points on the NPRS in order to demonstrate clinically significant reduction in shoulder pain. Baseline: 11/23/23: 10/10 at worst.        01/25/24: 8/10 at worst.  Goal status: IN PROGRESS  3.  Pt will decrease  quick DASH score to less than or equal to 20% in order to demonstrate clinically significant reduction in disability related to shoulder pain    Baseline: 11/23/23: 84%.        01/25/24: 59.1% Goal status: IN PROGRESS  4. Pt will increase strength by to 4+/5 or greater MMT grade for assessed shoulder musculature in order to demonstrate improvement in strength and function      Baseline: 11/23/23: Strength 4- to 4 with pain  during testing.        01/25/24: No notable improvement in strength  Goal status: NOT MET   5. Pt will achieve functional ER to ipsilateral scapula and functional IR to bra line as needed for dressing, self-care ADLs     Baseline: 11/23/23: Limited functional ER bilat (see chart above for hand behind head/back).        01/25/24: Met for functional ER/no improvement with functional IR.  Goal status: PARTIALLY MET/IN PROGRESS  PLAN: PT FREQUENCY: 1-2x/week  PT DURATION: 4 weeks  PLANNED INTERVENTIONS: Therapeutic exercises, Therapeutic activity, Neuromuscular re-education, Balance training, Gait training, Patient/Family education, Self Care, Joint mobilization, Joint manipulation, Vestibular training, Canalith repositioning, Orthotic/Fit training, DME instructions, Dry Needling, Electrical stimulation, Spinal manipulation, Spinal mobilization, Cryotherapy, Moist heat, Taping, Traction, Ultrasound, Ionotophoresis 4mg /ml Dexamethasone, Manual therapy, and Re-evaluation.  PLAN FOR NEXT SESSION:  Continue PT with progressive AROM and addition of weight/progressive load for long-lever flexion as tolerated. RTC and LHB isometrics with progression to isotonics including periscapular strengthening.     Venetia Endo, PT, DPT #[id number]  Venetia ONEIDA Endo, PT 02/07/2024, 7:25 AM

## 2024-07-01 ENCOUNTER — Ambulatory Visit: Admitting: Internal Medicine

## 2024-08-14 ENCOUNTER — Other Ambulatory Visit: Payer: Self-pay

## 2024-08-14 ENCOUNTER — Encounter: Payer: Self-pay | Admitting: Internal Medicine

## 2024-08-14 ENCOUNTER — Ambulatory Visit (INDEPENDENT_AMBULATORY_CARE_PROVIDER_SITE_OTHER): Admitting: Internal Medicine

## 2024-08-14 VITALS — BP 118/78 | HR 65 | Temp 98.5°F | Resp 18 | Ht 65.0 in | Wt 216.4 lb

## 2024-08-14 DIAGNOSIS — J3089 Other allergic rhinitis: Secondary | ICD-10-CM

## 2024-08-14 DIAGNOSIS — J393 Upper respiratory tract hypersensitivity reaction, site unspecified: Secondary | ICD-10-CM | POA: Diagnosis not present

## 2024-08-14 DIAGNOSIS — J4520 Mild intermittent asthma, uncomplicated: Secondary | ICD-10-CM

## 2024-08-14 NOTE — Patient Instructions (Addendum)
 Other Allergic Rhinitis: - Use nasal saline rinses before nose sprays such as with Neilmed Sinus Rinse.  Use distilled water.   - Use Flonase 2 sprays each nostril daily. Aim upward and outward. - Use Azelastine 2 sprays each nostril twice daily as needed for runny nose, drainage, sneezing, congestion. Aim upward and outward. - Use Zyrtec 10 mg daily.   Food Allergy:  - please strictly avoid shellfish and fish.   Mild Intermittent Asthma: - Rescue inhaler: Albuterol 2 puffs or 1 vial via nebulizer every 4-6 hours as needed for respiratory symptoms of cough, shortness of breath, or wheezing Asthma control goals:  Full participation in all desired activities (may need albuterol before activity) Albuterol use two times or less a week on average (not counting use with activity) Cough interfering with sleep two times or less a month Oral steroids no more than once a year No hospitalizations   Follow up: 1/5 at 830 for skin testing 1-55 + shellfish & fish mix + shellfish &fish individuals  Hold all anti-histamines (ex. Azelastine, Xyzal, Allegra, Zyrtec, Claritin, Benadryl, Pepcid) 3 days prior to next visit.

## 2024-08-14 NOTE — Progress Notes (Signed)
 "  NEW PATIENT  Date of Service/Encounter:  08/14/2024  Consult requested by: Servando Hire, MD   Subjective:   Anita Mendoza (DOB: 03/01/1971) is a 53 y.o. female who presents to the clinic on 08/14/2024 with a chief complaint of Establish Care .    History obtained from: chart review and patient.   Asthma:  Diagnosed around age 62s.  Mostly mild and flare ups only with significant weather change about 1-2x/year where she requires albuterol. Outside of this, she denies trouble with wheezing/dyspnea/chronic cough.  0 daytime symptoms in past month, 0 nighttime awakenings in past month Using rescue inhaler: 1-2x/year  Limitations to daily activity: none 0 ED visits/UC visits and 0 oral steroids in the past year 0 number of lifetime hospitalizations, 0 number of lifetime intubations.  Identified Triggers: weather change Prior PFTs or spirometry: none Previously used therapies: Qvar Current regimen:  Maintenance: none Rescue: Albuterol 2 puffs q4-6 hrs PRN  Rhinitis:  Worsened around 2023. Previously was in [place] and had trouble there with her allergies also. Symptoms include: nasal congestion, rhinorrhea, post nasal drainage, sneezing, and itchy eyes, losing voice, reduced sense of smell  Occurs year-round with seasonal flares in Summer  Potential triggers: not sure   Treatments tried:  Azelastine daily  Flonase daily Zyrtec   Previous allergy testing: yes many years ago, thinks reaction to dust mites and cats  History of sinus surgery: no Nonallergic triggers: none   Concern for Food Allergy:  Foods of concern: shellfish and fish  History of reaction:  Itchy throat with shrimp and lobster and salmon  Carries an epinephrine autoinjector: no  Reviewed:  11/10/2023: seen by Dr Evy for allergic rhinitis and asthma, on Flonase, Zyrtec, Azelastine, Albuterol.   03/10/2023: followed by UNK Domino for erythema dyschromicum perstans, start protopic BID,  clobetasol PRN for active lesions, start clofazimine.  Past Medical History: Past Medical History:  Diagnosis Date   Asthma 2014   Hypertension    Past Surgical History: Past Surgical History:  Procedure Laterality Date   ABDOMINAL HYSTERECTOMY     carpul tunnel Bilateral 2011   choleycystectomy N/A 2000   ROTATOR CUFF REPAIR Left 2012   TONSILLECTOMY N/A 1979    Family History: Family History  Problem Relation Age of Onset   Asthma Mother    Cancer Father        prostate   Breast cancer Maternal Aunt    Cancer Maternal Aunt    Breast cancer Cousin    Cancer Cousin        bladder    Social History:  Flooring in bedroom: laminate Pets: dogs, birds  Tobacco use/exposure: none Job: disabled   Medication List:  Allergies as of 08/14/2024       Reactions   Shellfish Allergy Anaphylaxis   Latex Rash, Other (See Comments)   Skin rash like a burn per patient        Medication List        Accurate as of August 14, 2024 11:28 AM. If you have any questions, ask your nurse or doctor.          albuterol 108 (90 Base) MCG/ACT inhaler Commonly known as: VENTOLIN HFA Inhale 2 puffs into the lungs every 6 (six) hours as needed for wheezing or shortness of breath. Reported on 12/23/2015   Azelastine HCl 137 MCG/SPRAY Soln Place 2 sprays into both nostrils 2 (two) times daily.   beclomethasone 40 MCG/ACT inhaler Commonly known as: QVAR Inhale 2 puffs  into the lungs 2 (two) times daily. Reported on 12/23/2015 What changed:  when to take this reasons to take this   cetirizine 10 MG tablet Commonly known as: ZYRTEC Take 10 mg by mouth daily.   fluticasone 50 MCG/ACT nasal spray Commonly known as: FLONASE Place 2 sprays into both nostrils daily.   gabapentin 100 MG capsule Commonly known as: NEURONTIN Take 100 mg by mouth 3 (three) times daily as needed.   lisinopril 5 MG tablet Commonly known as: ZESTRIL Take 5 mg by mouth daily.   Vitamin D  (Ergocalciferol) 1.25 MG (50000 UNIT) Caps capsule Commonly known as: DRISDOL         REVIEW OF SYSTEMS: Pertinent positives and negatives discussed in HPI.   Objective:   Physical Exam: BP 118/78 (BP Location: Left Arm, Patient Position: Sitting, Cuff Size: Large)   Pulse 65   Temp 98.5 F (36.9 C) (Temporal)   Resp 18   Ht 5' 5 (1.651 m)   Wt 216 lb 6.4 oz (98.2 kg)   SpO2 97%   BMI 36.01 kg/m  Body mass index is 36.01 kg/m. GEN: alert, well developed HEENT: clear conjunctiva, nose with + mild inferior turbinate hypertrophy, pink nasal mucosa, + clear rhinorrhea, + cobblestoning HEART: regular rate and rhythm, no murmur LUNGS: clear to auscultation bilaterally, no coughing, unlabored respiration ABDOMEN: soft, non distended  SKIN: no rashes or lesions  Assessment:   1. Mild intermittent asthma without complication   2. Upper respiratory tract hypersensitivity reaction   3. Other allergic rhinitis     Plan/Recommendations:  Other Allergic Rhinitis: - Due to turbinate hypertrophy, seasonal flare ups and unresponsive to over the counter meds, will perform skin testing to identify aeroallergen triggers.   - Use nasal saline rinses before nose sprays such as with Neilmed Sinus Rinse.  Use distilled water.   - Use Flonase 2 sprays each nostril daily. Aim upward and outward. - Use Azelastine 2 sprays each nostril twice daily as needed for runny nose, drainage, sneezing, congestion. Aim upward and outward. - Use Zyrtec 10 mg daily.   Food Allergy:  - please strictly avoid shellfish and fish.  - Initial rxn: throat itching  Mild Intermittent Asthma: - Plan for spirometry at next visit.  - Rescue inhaler: Albuterol 2 puffs or 1 vial via nebulizer every 4-6 hours as needed for respiratory symptoms of cough, shortness of breath, or wheezing Asthma control goals:  Full participation in all desired activities (may need albuterol before activity) Albuterol use two times or  less a week on average (not counting use with activity) Cough interfering with sleep two times or less a month Oral steroids no more than once a year No hospitalizations   Follow up: 1/5 at 830 for skin testing 1-55 + shellfish & fish mix + shellfish &fish individuals ; no IDs  Hold all anti-histamines (Xyzal, Allegra, Zyrtec, Claritin, Benadryl, Pepcid) 3 days prior to next visit.     Arleta Blanch, MD [facility]        "

## 2024-08-17 LAB — ALLERGEN PROFILE, FOOD-FISH
Allergen Mackerel IgE: <0.1 kU/L
Allergen Salmon IgE: <0.1 kU/L
Allergen Trout IgE: <0.1 kU/L
Allergen Walley Pike IgE: <0.1 kU/L
Codfish IgE: <0.1 kU/L
Halibut IgE: <0.1 kU/L
Tuna: <0.1 kU/L

## 2024-08-17 LAB — ALLERGEN PROFILE, SHELLFISH
Clam IgE: <0.1 kU/L
F023-IgE Crab: <0.1 kU/L
F080-IgE Lobster: <0.1 kU/L
F290-IgE Oyster: <0.1 kU/L
Scallop IgE: 0.36 kU/L — AB
Shrimp IgE: <0.1 kU/L

## 2024-08-19 ENCOUNTER — Ambulatory Visit: Admitting: Internal Medicine

## 2024-08-19 DIAGNOSIS — J393 Upper respiratory tract hypersensitivity reaction, site unspecified: Secondary | ICD-10-CM

## 2024-08-19 DIAGNOSIS — J3089 Other allergic rhinitis: Secondary | ICD-10-CM

## 2024-08-19 DIAGNOSIS — J3081 Allergic rhinitis due to animal (cat) (dog) hair and dander: Secondary | ICD-10-CM | POA: Diagnosis not present

## 2024-08-19 DIAGNOSIS — J301 Allergic rhinitis due to pollen: Secondary | ICD-10-CM | POA: Diagnosis not present

## 2024-08-19 DIAGNOSIS — J4520 Mild intermittent asthma, uncomplicated: Secondary | ICD-10-CM

## 2024-08-19 MED ORDER — AZELASTINE-FLUTICASONE 137-50 MCG/ACT NA SUSP: 2.0000 | Freq: Two times a day (BID) | NASAL | 5 refills | Status: DC

## 2024-08-19 MED ORDER — AZELASTINE-FLUTICASONE 137-50 MCG/ACT NA SUSP: 2.0000 | Freq: Two times a day (BID) | NASAL | 5 refills | Status: AC

## 2024-08-19 NOTE — Progress Notes (Signed)
 "  FOLLOW UP Date of Service/Encounter:  08/19/2024   Subjective:  Anita Mendoza (DOB: [birth year]) is a 54 y.o. female who [facility] for follow up for skin testing.   History obtained from: chart review and patient.  Anti histamines held.   Past Medical History: Past Medical History:  Diagnosis Date   Asthma 2014   Hypertension     Objective:  There were no vitals taken for this visit. There is no height or weight on file to calculate BMI. Physical Exam: GEN: alert, well developed HEENT: clear conjunctiva, MMM LUNGS: unlabored respiration   Skin Testing:  Skin prick testing was placed, which includes aeroallergens/foods, histamine control, and saline control.  Verbal consent was obtained prior to placing test.  Patient tolerated procedure well.  Allergy  testing results were read and interpreted by myself, documented by clinical staff. Adequate positive and negative control.  Positive results to:  Results discussed with patient/family.  Airborne Adult Perc - 08/19/24 0903     Time Antigen Placed 9096    Allergen Manufacturer Jestine    Location Back    Number of Test 55    1. Control-Buffer 50% Glycerol Negative    2. Control-Histamine 3+    3. Bahia Negative    4. Bermuda Negative    5. Johnson Negative    6. [place]  Blue Negative    7. Meadow Fescue 2+    8. Perennial Rye Negative    9. Timothy 2+    10. Ragweed Mix Negative    11. Cocklebur Negative    12. Plantain,  English Negative    13. Baccharis Negative    14. Dog Fennel Negative    15. Russian Thistle Negative    16. Lamb's Quarters Negative    17. Sheep Sorrell Negative    18. Rough Pigweed Negative    19. Marsh Elder, Rough Negative    20. Mugwort, Common Negative    21. Box, Elder Negative    22. Cedar, red Negative    23. Sweet Gum Negative    24. Pecan Pollen Negative    25. Pine Mix Negative    26. Walnut, Black Pollen Negative    27. Red  Mulberry Negative    28. Ash Mix Negative    29. Birch Mix 3+    30. Beech American Negative    31. Cottonwood, Eastern Negative    32. Hickory, White 3+    33. Maple Mix Negative    34. Oak, Eastern Mix Negative    35. Sycamore Eastern Negative    36. Alternaria Alternata Negative    37. Cladosporium Herbarum Negative    38. Aspergillus Mix Negative    39. Penicillium Mix Negative    40. Bipolaris Sorokiniana (Helminthosporium) Negative    41. Drechslera Spicifera (Curvularia) Negative    42. Mucor Plumbeus Negative    43. Fusarium Moniliforme Negative    44. Aureobasidium Pullulans (pullulara) Negative    45. Rhizopus Oryzae Negative    46. Botrytis Cinera Negative    47. Epicoccum Nigrum Negative    48. Phoma Betae Negative    49. Dust Mite Mix 3+    50. Cat Hair 10,000 BAU/ml 2+    51.  Dog Epithelia Negative    52. Mixed Feathers Negative    53. Horse Epithelia Negative    54. Cockroach, German Negative    55. Tobacco Leaf Negative  Food Adult Perc - 08/19/24 0900     Time Antigen Placed 9096    Allergen Manufacturer Jestine    Location Back    Number of allergen test 12    8. Shellfish Mix Negative    9. Fish Mix Negative    18. Trout Negative    19. Tuna Negative    20. Salmon Negative    21. Flounder Negative    22. Codfish Negative    23. Shrimp Negative    24. Crab Negative    25. Lobster Negative    26. Oyster Negative    27. Scallops Negative           Assessment:   1. Seasonal allergic rhinitis due to pollen   2. Upper respiratory tract hypersensitivity reaction   3. Mild intermittent asthma without complication   4. 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 Allergic rhinitis due to dust mite     Plan/Recommendations:  Allergic Rhinitis: - Due to turbinate hypertrophy, seasonal flare ups asthma and unresponsive to over the counter meds, will perform skin testing to identify aeroallergen triggers.   - Positive skin test  08/2024: trees, grasses, dust mites, cats  - Avoidance measures discussed. - Use nasal saline rinses before nose sprays such as with Neilmed Sinus Rinse.  Use distilled water.   - Use Dymista  2 sprays each nostril twice daily. Aim upward and outward. - Use Zyrtec 10 mg daily.  - Consider allergy  shots as long term control of your symptoms by teaching your immune system to be more tolerant of your allergy  triggers  Food Reaction:  - okay to reintroduce shellfish and fish at home.  - Initial rxn: throat itching - SPT 08/2024: negative for shellfish and fish  - sIgE 07/2024: negative to fish, very slight reactivity to scallops but rest of shellfish negative    Mild Intermittent Asthma: - Plan for spirometry at next visit.  - Rescue inhaler: Albuterol 2 puffs or 1 vial via nebulizer every 4-6 hours as needed for respiratory symptoms of cough, shortness of breath, or wheezing Asthma control goals:  Full participation in all desired activities (may need albuterol before activity) Albuterol use two times or less a week on average (not counting use with activity) Cough interfering with sleep two times or less a month Oral steroids no more than once a year No hospitalizations  ALLERGEN AVOIDANCE MEASURES   Dust Mites Use central air conditioning and heat; and change the filter monthly.  Pleated filters work better than mesh filters.  Electrostatic filters may also be used; wash the filter monthly.  Window air conditioners may be used, but do not clean the air as well as a central air conditioner.  Change or wash the filter monthly. Keep windows closed.  Do not use attic fans.   Encase the mattress, box springs and pillows with zippered, dust proof covers. Wash the bed linens in hot water weekly.   Remove carpet, especially from the bedroom. Remove stuffed animals, throw pillows, dust ruffles, heavy drapes and other items that collect dust from the bedroom. Do not use a humidifier.   Use wood,  vinyl or leather furniture instead of cloth furniture in the bedroom. Keep the indoor humidity at 30 - 40%.   Pollen Avoidance Pollen levels are highest during the mid-day and afternoon.  Consider this when planning outdoor activities. Avoid being outside when the grass is being mowed, or wear a mask if the pollen-allergic person must  be the one to mow the grass. Keep the windows closed to keep pollen outside of the home. Use an air conditioner to filter the air. Take a shower, wash hair, and change clothing after working or playing outdoors during pollen season. Pet Dander Keep the pet out of your bedroom and restrict it to only a few rooms. Be advised that keeping the pet in only one room will not limit the allergens to that room. Dont pet, hug or kiss the pet; if you do, wash your hands with soap and water. High-efficiency particulate air (HEPA) cleaners run continuously in a bedroom or living room can reduce allergen levels over time. Regular use of a high-efficiency vacuum cleaner or a central vacuum can reduce allergen levels. Giving your pet a bath at least once a week can reduce airborne allergen.    Return in about 2 months (around 10/17/2024).  Arleta Blanch, MD [facility]       "

## 2024-08-19 NOTE — Patient Instructions (Addendum)
 Allergic Rhinitis: - Positive skin test 08/2024: trees, grasses, dust mites, cats  - Use nasal saline rinses before nose sprays such as with Neilmed Sinus Rinse.  Use distilled water.   - Use Dymista  2 sprays each nostril twice daily. Aim upward and outward. - Use Zyrtec 10 mg daily.  - Consider allergy  shots as long term control of your symptoms by teaching your immune system to be more tolerant of your allergy  triggers  Food Reaction:  - okay to reintroduce shellfish and fish at home.    Mild Intermittent Asthma: - Plan for spirometry at next visit.  - Rescue inhaler: Albuterol 2 puffs or 1 vial via nebulizer every 4-6 hours as needed for respiratory symptoms of cough, shortness of breath, or wheezing Asthma control goals:  Full participation in all desired activities (may need albuterol before activity) Albuterol use two times or less a week on average (not counting use with activity) Cough interfering with sleep two times or less a month Oral steroids no more than once a year No hospitalizations  ALLERGEN AVOIDANCE MEASURES   Dust Mites Use central air conditioning and heat; and change the filter monthly.  Pleated filters work better than mesh filters.  Electrostatic filters may also be used; wash the filter monthly.  Window air conditioners may be used, but do not clean the air as well as a central air conditioner.  Change or wash the filter monthly. Keep windows closed.  Do not use attic fans.   Encase the mattress, box springs and pillows with zippered, dust proof covers. Wash the bed linens in hot water weekly.   Remove carpet, especially from the bedroom. Remove stuffed animals, throw pillows, dust ruffles, heavy drapes and other items that collect dust from the bedroom. Do not use a humidifier.   Use wood, vinyl or leather furniture instead of cloth furniture in the bedroom. Keep the indoor humidity at 30 - 40%.   Pollen Avoidance Pollen levels are highest during the  mid-day and afternoon.  Consider this when planning outdoor activities. Avoid being outside when the grass is being mowed, or wear a mask if the pollen-allergic person must be the one to mow the grass. Keep the windows closed to keep pollen outside of the home. Use an air conditioner to filter the air. Take a shower, wash hair, and change clothing after working or playing outdoors during pollen season. Pet Dander Keep the pet out of your bedroom and restrict it to only a few rooms. Be advised that keeping the pet in only one room will not limit the allergens to that room. Dont pet, hug or kiss the pet; if you do, wash your hands with soap and water. High-efficiency particulate air (HEPA) cleaners run continuously in a bedroom or living room can reduce allergen levels over time. Regular use of a high-efficiency vacuum cleaner or a central vacuum can reduce allergen levels. Giving your pet a bath at least once a week can reduce airborne allergen.

## 2024-08-21 ENCOUNTER — Other Ambulatory Visit: Payer: Self-pay | Admitting: *Deleted

## 2024-08-21 MED ORDER — AZELASTINE HCL 137 MCG/SPRAY NA SOLN: 1.0000 | Freq: Two times a day (BID) | NASAL | 5 refills | Status: AC | PRN

## 2024-08-21 MED ORDER — FLUTICASONE PROPIONATE 50 MCG/ACT NA SUSP: 2.0000 | Freq: Every day | NASAL | 5 refills | Status: AC

## 2024-08-21 NOTE — Telephone Encounter (Signed)
 Received PA for Azelastine -Fluticasone . Sending in separately.

## 2024-11-29 ENCOUNTER — Ambulatory Visit: Admitting: Allergy & Immunology
